# Patient Record
Sex: Female | Born: 1990 | Race: White | Hispanic: No | Marital: Single | Smoking: Former smoker
Health system: Southern US, Community
[De-identification: ages and names within clinical notes are randomized; demographics above are authoritative.]

## PROBLEM LIST (undated history)

## (undated) ENCOUNTER — Inpatient Hospital Stay: Payer: Self-pay

## (undated) DIAGNOSIS — Z8619 Personal history of other infectious and parasitic diseases: Secondary | ICD-10-CM

## (undated) DIAGNOSIS — G43109 Migraine with aura, not intractable, without status migrainosus: Secondary | ICD-10-CM

## (undated) DIAGNOSIS — Z87440 Personal history of urinary (tract) infections: Secondary | ICD-10-CM

## (undated) DIAGNOSIS — Z862 Personal history of diseases of the blood and blood-forming organs and certain disorders involving the immune mechanism: Secondary | ICD-10-CM

## (undated) DIAGNOSIS — K529 Noninfective gastroenteritis and colitis, unspecified: Secondary | ICD-10-CM

## (undated) DIAGNOSIS — Z8759 Personal history of other complications of pregnancy, childbirth and the puerperium: Secondary | ICD-10-CM

## (undated) HISTORY — DX: Personal history of diseases of the blood and blood-forming organs and certain disorders involving the immune mechanism: Z86.2

## (undated) HISTORY — DX: Migraine with aura, not intractable, without status migrainosus: G43.109

## (undated) HISTORY — PX: TYMPANOSTOMY TUBE PLACEMENT: SHX32

## (undated) HISTORY — PX: EYE SURGERY: SHX253

## (undated) HISTORY — PX: OTHER SURGICAL HISTORY: SHX169

## (undated) HISTORY — DX: Personal history of other complications of pregnancy, childbirth and the puerperium: Z87.59

## (undated) HISTORY — DX: Noninfective gastroenteritis and colitis, unspecified: K52.9

## (undated) HISTORY — DX: Personal history of urinary (tract) infections: Z87.440

---
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
## 2011-01-08 HISTORY — PX: APPENDECTOMY: SHX54

## 2011-05-30 ENCOUNTER — Encounter (HOSPITAL_COMMUNITY): Payer: Self-pay | Admitting: *Deleted

## 2011-05-30 ENCOUNTER — Emergency Department (HOSPITAL_COMMUNITY)
Admission: EM | Admit: 2011-05-30 | Discharge: 2011-05-30 | Disposition: A | Discharge status: Home / Self Care | Payer: Medicaid Other | Attending: Emergency Medicine | Admitting: Emergency Medicine

## 2011-05-30 ENCOUNTER — Emergency Department (HOSPITAL_COMMUNITY): Payer: Medicaid Other

## 2011-05-30 DIAGNOSIS — R070 Pain in throat: Secondary | ICD-10-CM | POA: Insufficient documentation

## 2011-05-30 DIAGNOSIS — K2970 Gastritis, unspecified, without bleeding: Secondary | ICD-10-CM

## 2011-05-30 DIAGNOSIS — R599 Enlarged lymph nodes, unspecified: Secondary | ICD-10-CM | POA: Insufficient documentation

## 2011-05-30 DIAGNOSIS — K5900 Constipation, unspecified: Secondary | ICD-10-CM | POA: Insufficient documentation

## 2011-05-30 DIAGNOSIS — R110 Nausea: Secondary | ICD-10-CM | POA: Insufficient documentation

## 2011-05-30 DIAGNOSIS — R10819 Abdominal tenderness, unspecified site: Secondary | ICD-10-CM | POA: Insufficient documentation

## 2011-05-30 DIAGNOSIS — R1013 Epigastric pain: Secondary | ICD-10-CM | POA: Insufficient documentation

## 2011-05-30 DIAGNOSIS — J45909 Unspecified asthma, uncomplicated: Secondary | ICD-10-CM | POA: Insufficient documentation

## 2011-05-30 LAB — COMPREHENSIVE METABOLIC PANEL
Alkaline Phosphatase: 61 U/L (ref 39–117)
BUN: 7 mg/dL (ref 6–23)
Chloride: 104 mEq/L (ref 96–112)
Creatinine, Ser: 0.75 mg/dL (ref 0.50–1.10)
GFR calc Af Amer: >90 mL/min (ref >90)
Glucose, Bld: 94 mg/dL (ref 70–99)
Potassium: 3.9 mEq/L (ref 3.5–5.1)
Total Bilirubin: 0.4 mg/dL (ref 0.3–1.2)

## 2011-05-30 LAB — URINALYSIS, ROUTINE W REFLEX MICROSCOPIC
Bilirubin Urine: NEGATIVE
Glucose, UA: NEGATIVE mg/dL
Ketones, ur: NEGATIVE mg/dL
Nitrite: NEGATIVE
Specific Gravity, Urine: 1.021 (ref 1.005–1.030)
pH: 5.5 (ref 5.0–8.0)

## 2011-05-30 LAB — DIFFERENTIAL
Basophils Relative: 0 % (ref 0–1)
Eosinophils Absolute: 0 10*3/uL (ref 0.0–0.7)
Lymphs Abs: 1.3 10*3/uL (ref 0.7–4.0)
Monocytes Absolute: 0.7 10*3/uL (ref 0.1–1.0)
Monocytes Relative: 10 % (ref 3–12)
Neutro Abs: 4.5 10*3/uL (ref 1.7–7.7)
Neutrophils Relative %: 70 % (ref 43–77)

## 2011-05-30 LAB — CBC
HCT: 40.5 % (ref 36.0–46.0)
Hemoglobin: 14.2 g/dL (ref 12.0–15.0)
MCH: 30.8 pg (ref 26.0–34.0)
MCHC: 35.1 g/dL (ref 30.0–36.0)
RBC: 4.61 MIL/uL (ref 3.87–5.11)

## 2011-05-30 LAB — POCT PREGNANCY, URINE: Preg Test, Ur: NEGATIVE

## 2011-05-30 LAB — LIPASE, BLOOD: Lipase: 19 U/L (ref 11–59)

## 2011-05-30 MED ORDER — ONDANSETRON HCL 4 MG PO TABS: 4.0000 mg | ORAL_TABLET | Freq: Four times a day (QID) | ORAL | Status: AC

## 2011-05-30 MED ORDER — SUCRALFATE 1 G PO TABS: 1.0000 g | ORAL_TABLET | Freq: Four times a day (QID) | ORAL | Status: DC

## 2011-05-30 MED ORDER — FAMOTIDINE 20 MG PO TABS: 20.0000 mg | ORAL_TABLET | Freq: Two times a day (BID) | ORAL | Status: DC

## 2011-05-30 MED ORDER — ONDANSETRON HCL 4 MG/2ML IJ SOLN
4.0000 mg | Freq: Once | INTRAMUSCULAR | Status: AC
  Administered 2011-05-30: 4 mg via INTRAVENOUS
  Filled 2011-05-30: qty 2

## 2011-05-30 MED ORDER — FAMOTIDINE IN NACL 20-0.9 MG/50ML-% IV SOLN
20.0000 mg | Freq: Once | INTRAVENOUS | Status: AC
  Administered 2011-05-30: 20 mg via INTRAVENOUS
  Filled 2011-05-30: qty 50

## 2011-05-30 MED ORDER — POLYETHYLENE GLYCOL 3350 17 G PO PACK: 17.0000 g | PACK | Freq: Every day | ORAL | Status: AC

## 2011-05-30 MED ORDER — FENTANYL CITRATE 0.05 MG/ML IJ SOLN
50.0000 ug | Freq: Once | INTRAMUSCULAR | Status: AC
  Administered 2011-05-30: 50 ug via INTRAVENOUS
  Filled 2011-05-30: qty 2

## 2011-05-30 NOTE — ED Provider Notes (Signed)
History     CSN: [id number]  Arrival date & time 05/30/11  4098   First MD Initiated Contact with Patient 05/30/11 [phone]      Chief Complaint  Patient presents with  . Abdominal Pain    (Consider location/radiation/quality/duration/timing/severity/associated sxs/prior treatment) Patient is a 21 y.o. female presenting with abdominal pain.  Abdominal Pain The primary symptoms of the illness include abdominal pain.   21 year old female presents to emergency room with complaint of one week of worsening abdominal pain. Pain occurs while she is eating. It is epigastric and diffuse at times. Patient describes pain as a burning sensation and uncomfortable. Symptoms last 30 minutes to an hour after eating. Patient has been taking ibuprofen without improvement in symptoms. Patient had vomiting on Saturday, since that time has had acid taste in her mouth. She has had no fevers. She has had persistent nausea. Patient has IUD, does not have periods. Patient is not tried tums or antiacid medications. Patient also complains of sore throat and enlarged lymph nodes. No sick contacts. Patient reports she has not had a bowel movement in 3 days which is unusual for her, normally she goes once a day.   Past Medical History  Diagnosis Date  . Asthma   . Migraine     Past Surgical History  Procedure Date  . Eye surgery     No family history on file.  History  Substance Use Topics  . Smoking status: Never Smoker   . Smokeless tobacco: Not on file  . Alcohol Use: No    OB History    Grav Para Term Preterm Abortions TAB SAB Ect Mult Living                  Review of Systems  Gastrointestinal: Positive for abdominal pain.  All other systems reviewed and are negative.    Allergies  Penicillins  Home Medications   Current Outpatient Rx  Name Route Sig Dispense Refill  . OVER THE COUNTER MEDICATION Oral Take 1 tablet by mouth daily. Hydroxycut    . ALBUTEROL 90 MCG/ACT IN AERS  Inhalation Inhale 2 puffs into the lungs every 6 (six) hours as needed. For wheezing       BP 122/69  Pulse 71  Temp(Src) 98.4 F (36.9 C) (Oral)  Resp 16  SpO2 99%  Physical Exam  Nursing note and vitals reviewed. Constitutional: She is oriented to person, place, and time. She appears well-developed and well-nourished.  HENT:  Head: Normocephalic and atraumatic.  Nose: Nose normal.  Mouth/Throat: Oropharynx is clear and moist. No oropharyngeal exudate.  Eyes: Conjunctivae and EOM are normal. Pupils are equal, round, and reactive to light.  Neck: Normal range of motion. Neck supple. No JVD present. No tracheal deviation present. No thyromegaly present.  Cardiovascular: Normal rate, regular rhythm, normal heart sounds and intact distal pulses.  Exam reveals no gallop and no friction rub.   No murmur heard. Pulmonary/Chest: Effort normal and breath sounds normal. No stridor. No respiratory distress. She has no wheezes. She has no rales. She exhibits no tenderness.  Abdominal: Soft. Bowel sounds are normal. She exhibits no distension and no mass. There is tenderness (diffuse tenderness over entire abdomen worse in epigastrium and left upper quadrant). There is no rebound and no guarding.  Musculoskeletal: Normal range of motion. She exhibits no edema and no tenderness.  Lymphadenopathy:    She has cervical adenopathy.  Neurological: She is oriented to person, place, and time. She exhibits normal muscle  tone. Coordination normal.  Skin: Skin is dry. No rash noted. No erythema. No pallor.  Psychiatric: She has a normal mood and affect. Her behavior is normal. Judgment and thought content normal.    ED Course  Procedures (including critical care time)   Labs Reviewed  CBC  DIFFERENTIAL  COMPREHENSIVE METABOLIC PANEL  LIPASE, BLOOD  POCT PREGNANCY, URINE  URINALYSIS, ROUTINE W REFLEX MICROSCOPIC   Dg Abd 1 View  05/30/2011  *RADIOLOGY REPORT*  Clinical Data: Abdominal pain,  vomiting.  ABDOMEN - 1 VIEW  Comparison: None.  Findings: Hemidiaphragms are excluded from the image.  Organ outlines normal where seen.  IUD projects over the pelvis. Nonobstructive bowel gas pattern.  Moderate stool burden.  No acute osseous finding.  IMPRESSION: Nonobstructive bowel gas pattern.  Original Report Authenticated By: Waneta Martins, M.D.     1. Abdominal pain   2. Gastritis   3. Constipation       MDM  21 year old female with abdominal pain. Suspect she may have gastritis and/or GERD given her symptoms. We'll check labs give medications for pain and nausea and check KUB given history of constipation for 3 days      5:56 AM Patient is feeling much better. Discussed findings from today's visit with her. Will start on pepcid and Carafate along with MiraLAX for constipation  Olivia Mackie, MD 05/30/11 [phone]

## 2011-05-30 NOTE — Discharge Instructions (Signed)
Take medications as prescribed. Followup with your doctor for recheck in 2-3 days. Return to emergency room for worsening condition or new concerning symptoms. Stick to a bland diet until feeling better  Abdominal Pain Abdominal pain can be caused by many things. Your caregiver decides the seriousness of your pain by an examination and possibly blood tests and X-rays. Many cases can be observed and treated at home. Most abdominal pain is not caused by a disease and will probably improve without treatment. However, in many cases, more time must pass before a clear cause of the pain can be found. Before that point, it may not be known if you need more testing, or if hospitalization or surgery is needed. HOME CARE INSTRUCTIONS   Do not take laxatives unless directed by your caregiver.   Take pain medicine only as directed by your caregiver.   Only take over-the-counter or prescription medicines for pain, discomfort, or fever as directed by your caregiver.   Try a clear liquid diet (broth, tea, or water) for as long as directed by your caregiver. Slowly move to a bland diet as tolerated.  SEEK IMMEDIATE MEDICAL CARE IF:   The pain does not go away.   You have a fever.   You keep throwing up (vomiting).   The pain is felt only in portions of the abdomen. Pain in the right side could possibly be appendicitis. In an adult, pain in the left lower portion of the abdomen could be colitis or diverticulitis.   You pass bloody or black tarry stools.  MAKE SURE YOU:   Understand these instructions.   Will watch your condition.   Will get help right away if you are not doing well or get worse.  Document Released: 10/03/2004 Document Revised: 12/13/2010 Document Reviewed: 08/12/2007 [facility] Patient Information 2012 [place], [place].  Constipation in Adults Constipation is having fewer than 2 bowel movements per week. Usually, the stools are hard. As we grow older, constipation is more common. If  you try to fix constipation with laxatives, the problem may get worse. This is because laxatives taken over a long period of time make the colon muscles weaker. A low-fiber diet, not taking in enough fluids, and taking some medicines may make these problems worse. MEDICATIONS THAT MAY CAUSE CONSTIPATION  Water pills (diuretics).   Calcium channel blockers (used to control blood pressure and for the heart).   Certain pain medicines (narcotics).   Anticholinergics.   Anti-inflammatory agents.   Antacids that contain aluminum.  DISEASES THAT CONTRIBUTE TO CONSTIPATION  Diabetes.   Parkinson's disease.   Dementia.   Stroke.   Depression.   Illnesses that cause problems with salt and water metabolism.  HOME CARE INSTRUCTIONS   Constipation is usually best cared for without medicines. Increasing dietary fiber and eating more fruits and vegetables is the best way to manage constipation.   Slowly increase fiber intake to 25 to 38 grams per day. Whole grains, fruits, vegetables, and legumes are good sources of fiber. A dietitian can further help you incorporate high-fiber foods into your diet.   Drink enough water and fluids to keep your urine clear or pale yellow.   A fiber supplement may be added to your diet if you cannot get enough fiber from foods.   Increasing your activities also helps improve regularity.   Suppositories, as suggested by your caregiver, will also help. If you are using antacids, such as aluminum or calcium containing products, it will be helpful to switch to  products containing magnesium if your caregiver says it is okay.   If you have been given a liquid injection (enema) today, this is only a temporary measure. It should not be relied on for treatment of longstanding (chronic) constipation.   Stronger measures, such as magnesium sulfate, should be avoided if possible. This may cause uncontrollable diarrhea. Using magnesium sulfate may not allow you time to  make it to the bathroom.  SEEK IMMEDIATE MEDICAL CARE IF:   There is bright red blood in the stool.   The constipation stays for more than 4 days.   There is belly (abdominal) or rectal pain.   You do not seem to be getting better.   You have any questions or concerns.  MAKE SURE YOU:   Understand these instructions.   Will watch your condition.   Will get help right away if you are not doing well or get worse.  Document Released: 09/22/2003 Document Revised: 12/13/2010 Document Reviewed: 11/27/2010 [facility] Patient Information 2012 [place], [place].  Gastritis Gastritis is an inflammation (the body's way of reacting to injury and/or infection) of the stomach. It is often caused by viral or bacterial (germ) infections. It can also be caused by chemicals (including alcohol) and medications. This illness may be associated with generalized malaise (feeling tired, not well), cramps, and fever. The illness may last 2 to 7 days. If symptoms of gastritis continue, gastroscopy (looking into the stomach with a telescope-like instrument), biopsy (taking tissue samples), and/or blood tests may be necessary to determine the cause. Antibiotics will not affect the illness unless there is a bacterial infection present. One common bacterial cause of gastritis is an organism known as H. Pylori. This can be treated with antibiotics. Other forms of gastritis are caused by too much acid in the stomach. They can be treated with medications such as H2 blockers and antacids. Home treatment is usually all that is needed. Young children will quickly become dehydrated (loss of body fluids) if vomiting and diarrhea are both present. Medications may be given to control nausea. Medications are usually not given for diarrhea unless especially bothersome. Some medications slow the removal of the virus from the gastrointestinal tract. This slows down the healing process. HOME CARE INSTRUCTIONS Home care instructions for  nausea and vomiting:  For adults: drink small amounts of fluids often. Drink at least 2 quarts a day. Take sips frequently. Do not drink large amounts of fluid at one time. This may worsen the nausea.   Only take over-the-counter or prescription medicines for pain, discomfort, or fever as directed by your caregiver.   Drink clear liquids only. Those are anything you can see through such as water, broth, or soft drinks.   Once you are keeping clear liquids down, you may start full liquids, soups, juices, and ice cream or sherbet. Slowly add bland (plain, not spicy) foods to your diet.  Home care instructions for diarrhea:  Diarrhea can be caused by bacterial infections or a virus. Your condition should improve with time, rest, fluids, and/or anti-diarrheal medication.   Until your diarrhea is under control, you should drink clear liquids often in small amounts. Clear liquids include: water, broth, jell-o water and weak tea.  Avoid:  Milk.   Fruits.   Tobacco.   Alcohol.   Extremely hot or cold fluids.   Too much intake of anything at one time.  When your diarrhea stops you may add the following foods, which help the stool to become more formed:  Rice.  Bananas.   Apples without skin.   Dry toast.  Once these foods are tolerated you may add low-fat yogurt and low-fat cottage cheese. They will help to restore the normal bacterial balance in your bowel. Wash your hands well to avoid spreading bacteria (germ) or virus. SEEK IMMEDIATE MEDICAL CARE IF:   You are unable to keep fluids down.   Vomiting or diarrhea become persistent (constant).   Abdominal pain develops, increases, or localizes. (Right sided pain can be appendicitis. Left sided pain in adults can be diverticulitis.)   You develop a fever (an oral temperature above 102 F (38.9 C)).   Diarrhea becomes excessive or contains blood or mucus.   You have excessive weakness, dizziness, fainting or extreme thirst.     You are not improving or you are getting worse.   You have any other questions or concerns.  Document Released: 12/18/2000 Document Revised: 12/13/2010 Document Reviewed: 12/24/2004 [facility] Patient Information 2012 [place], [place]. Gastritis Gastritis is an inflammation (the body's way of reacting to injury and/or infection) of the stomach. It is often caused by viral or bacterial (germ) infections. It can also be caused by chemicals (including alcohol) and medications. This illness may be associated with generalized malaise (feeling tired, not well), cramps, and fever. The illness may last 2 to 7 days. If symptoms of gastritis continue, gastroscopy (looking into the stomach with a telescope-like instrument), biopsy (taking tissue samples), and/or blood tests may be necessary to determine the cause. Antibiotics will not affect the illness unless there is a bacterial infection present. One common bacterial cause of gastritis is an organism known as H. Pylori. This can be treated with antibiotics. Other forms of gastritis are caused by too much acid in the stomach. They can be treated with medications such as H2 blockers and antacids. Home treatment is usually all that is needed. Young children will quickly become dehydrated (loss of body fluids) if vomiting and diarrhea are both present. Medications may be given to control nausea. Medications are usually not given for diarrhea unless especially bothersome. Some medications slow the removal of the virus from the gastrointestinal tract. This slows down the healing process. HOME CARE INSTRUCTIONS Home care instructions for nausea and vomiting:  For adults: drink small amounts of fluids often. Drink at least 2 quarts a day. Take sips frequently. Do not drink large amounts of fluid at one time. This may worsen the nausea.   Only take over-the-counter or prescription medicines for pain, discomfort, or fever as directed by your caregiver.   Drink clear  liquids only. Those are anything you can see through such as water, broth, or soft drinks.   Once you are keeping clear liquids down, you may start full liquids, soups, juices, and ice cream or sherbet. Slowly add bland (plain, not spicy) foods to your diet.  Home care instructions for diarrhea:  Diarrhea can be caused by bacterial infections or a virus. Your condition should improve with time, rest, fluids, and/or anti-diarrheal medication.   Until your diarrhea is under control, you should drink clear liquids often in small amounts. Clear liquids include: water, broth, jell-o water and weak tea.  Avoid:  Milk.   Fruits.   Tobacco.   Alcohol.   Extremely hot or cold fluids.   Too much intake of anything at one time.  When your diarrhea stops you may add the following foods, which help the stool to become more formed:  Rice.   Bananas.   Apples without skin.  Dry toast.  Once these foods are tolerated you may add low-fat yogurt and low-fat cottage cheese. They will help to restore the normal bacterial balance in your bowel. Wash your hands well to avoid spreading bacteria (germ) or virus. SEEK IMMEDIATE MEDICAL CARE IF:   You are unable to keep fluids down.   Vomiting or diarrhea become persistent (constant).   Abdominal pain develops, increases, or localizes. (Right sided pain can be appendicitis. Left sided pain in adults can be diverticulitis.)   You develop a fever (an oral temperature above 102 F (38.9 C)).   Diarrhea becomes excessive or contains blood or mucus.   You have excessive weakness, dizziness, fainting or extreme thirst.   You are not improving or you are getting worse.   You have any other questions or concerns.  Document Released: 12/18/2000 Document Revised: 12/13/2010 Document Reviewed: 12/24/2004 [facility] Patient Information 2012 [place], [place].

## 2011-05-30 NOTE — ED Notes (Signed)
Patient is AOx4 and comfortable with her discharge instructions. 

## 2011-05-30 NOTE — ED Notes (Signed)
Request for urine culture printed and sent to lab for urine already sent

## 2011-05-30 NOTE — ED Notes (Addendum)
C/o abd pain, onset ~ 1 week ago, worse after eating, also bilateral low back pain, L>R, (denies: nd, fever, vaginal sx), mentions vomiting Thursday and Saturday, also urinary frequency. Pinpoints pain to "L mid back/flank, abd pain worse after eating", states, "has had gallbladder checked out previously and it has been OK in the past".

## 2011-05-31 LAB — URINE CULTURE
Colony Count: 5000
Culture  Setup Time: 201305230708

## 2011-09-17 DIAGNOSIS — S93409A Sprain of unspecified ligament of unspecified ankle, initial encounter: Secondary | ICD-10-CM | POA: Insufficient documentation

## 2011-09-17 DIAGNOSIS — X500XXA Overexertion from strenuous movement or load, initial encounter: Secondary | ICD-10-CM | POA: Insufficient documentation

## 2011-09-18 ENCOUNTER — Emergency Department (HOSPITAL_COMMUNITY)
Admission: EM | Admit: 2011-09-18 | Discharge: 2011-09-18 | Disposition: A | Discharge status: Home / Self Care | Payer: Self-pay | Attending: Emergency Medicine | Admitting: Emergency Medicine

## 2011-09-18 ENCOUNTER — Emergency Department (HOSPITAL_COMMUNITY): Payer: Self-pay

## 2011-09-18 ENCOUNTER — Encounter (HOSPITAL_COMMUNITY): Payer: Self-pay | Admitting: *Deleted

## 2011-09-18 DIAGNOSIS — S93402A Sprain of unspecified ligament of left ankle, initial encounter: Secondary | ICD-10-CM

## 2011-09-18 MED ORDER — HYDROCODONE-ACETAMINOPHEN 5-325 MG PO TABS
1.0000 | ORAL_TABLET | Freq: Once | ORAL | Status: AC
  Administered 2011-09-18: 1 via ORAL
  Filled 2011-09-18: qty 1

## 2011-09-18 MED ORDER — HYDROCODONE-ACETAMINOPHEN 5-325 MG PO TABS: 1.0000 | ORAL_TABLET | ORAL | Status: AC | PRN

## 2011-09-18 MED ORDER — IBUPROFEN 800 MG PO TABS: 800.0000 mg | ORAL_TABLET | Freq: Three times a day (TID) | ORAL | Status: AC

## 2011-09-18 NOTE — ED Notes (Signed)
Prescription x2 given with discharge instructions.

## 2011-09-18 NOTE — ED Notes (Signed)
Fell Sunday, fell again tonight. Was walking and "just fell". C/o L ankle pain, also L foot pain and leg pain up to thigh. Mentions numbness in toes. Scant bruising noted to L medial heal, CMS and ROM intact. Abrasion noted to R calf.

## 2011-09-18 NOTE — ED Provider Notes (Signed)
History     CSN: [id number]  Arrival date & time 09/17/11  2348   None     Chief Complaint  Patient presents with  . Fall  . Ankle Pain  . Leg Pain    (Consider location/radiation/quality/duration/timing/severity/associated sxs/prior treatment) HPI History provided by pt.   Pt had a fall 2 days ago and sustained an injury to her left ankle.  She twisted it again today.  Currently has moderate-severe pain medial ankle and is unable to bear weight.  Aggravated by flexion.  No associated paresthesias.    Past Medical History  Diagnosis Date  . Asthma   . Migraine     Past Surgical History  Procedure Date  . Eye surgery     No family history on file.  History  Substance Use Topics  . Smoking status: Never Smoker   . Smokeless tobacco: Not on file  . Alcohol Use: No    OB History    Grav Para Term Preterm Abortions TAB SAB Ect Mult Living                  Review of Systems  All other systems reviewed and are negative.    Allergies  Penicillins  Home Medications   Current Outpatient Rx  Name Route Sig Dispense Refill  . FAMOTIDINE 20 MG PO TABS Oral Take 1 tablet (20 mg total) by mouth 2 (two) times daily. 30 tablet 0  . IBUPROFEN 200 MG PO TABS Oral Take 200-600 mg by mouth every 6 (six) hours as needed. Pain or fever    . HYDROCODONE-ACETAMINOPHEN 5-325 MG PO TABS Oral Take 1 tablet by mouth every 4 (four) hours as needed for pain. 20 tablet 0  . IBUPROFEN 800 MG PO TABS Oral Take 1 tablet (800 mg total) by mouth 3 (three) times daily. 12 tablet 0    BP 135/76  Temp 98.3 F (36.8 C) (Oral)  Resp 18  SpO2 95%  Physical Exam  Nursing note and vitals reviewed. Constitutional: She is oriented to person, place, and time. She appears well-developed and well-nourished. No distress.  HENT:  Head: Normocephalic and atraumatic.  Eyes:       Normal appearance  Neck: Normal range of motion.  Pulmonary/Chest: Effort normal.  Musculoskeletal: Normal range  of motion.       Mild edema left medial malleolus.  Tenderness over and inferior to medial malleolus.  Pain w/ passive flexion and inversion.  2+ DP pulse.  Light and sharp sensation impaired in 2nd-5th toes.    Neurological: She is alert and oriented to person, place, and time.  Psychiatric: She has a normal mood and affect. Her behavior is normal.    ED Course  Procedures (including critical care time)  Labs Reviewed - No data to display Dg Ankle Complete Left  09/18/2011  *RADIOLOGY REPORT*  Clinical Data: Ankle pain after fall.  LEFT ANKLE COMPLETE - 3+ VIEW  Comparison: None.  Findings: Medial soft tissue swelling.  Lateral soft tissue swelling.  Slight widening of the medial tibiotalar joint space may represent ligamentous injury.  Vague curvilinear calcification over the lateral malleolus may represent avulsion fracture.  No displaced fractures identified.  No focal bone lesion or bone destruction.  IMPRESSION: Soft tissue swelling.  Suggestion of ligamentous injury in the medial ankle joint and lateral avulsion fractures.   Original Report Authenticated By: Marlon Pel, M.D.    Dg Foot Complete Left  09/18/2011  *RADIOLOGY REPORT*  Clinical Data:  Pain after fall.  LEFT FOOT - COMPLETE 3+ VIEW  Comparison: None.  Findings: The left foot appears intact. No evidence of acute fracture or subluxation.  No focal bone lesions.  Bone matrix and cortex appear intact.  No abnormal radiopaque densities in the soft tissues.  IMPRESSION: No acute bony abnormalities.   Original Report Authenticated By: Marlon Pel, M.D.      1. Sprain of left ankle       MDM  Pt presents w/ left ankle injury.  Xray consistent w/ ligamentous injury medially and possible tiny bone avulsion laterally.  Results discussed w/ pt.  Ortho tech placed in cam walker.  Pt received one vicodin for pain in ED and d/c'd home w/ same + ibuprofen.  Recommended RICE and ortho f/u (particularly because of sensory  deficits on exam).        Otilio Miu, [place] 09/18/11 [phone]

## 2011-09-19 NOTE — ED Provider Notes (Signed)
Medical screening examination/treatment/procedure(s) were performed by non-physician practitioner and as supervising physician I was immediately available for consultation/collaboration.    Vonte Rossin L Takari Duncombe, MD 09/19/11 0005 

## 2011-12-22 ENCOUNTER — Encounter (HOSPITAL_COMMUNITY): Payer: Self-pay | Admitting: Family

## 2011-12-22 ENCOUNTER — Inpatient Hospital Stay (HOSPITAL_COMMUNITY)
Admission: AD | Admit: 2011-12-22 | Discharge: 2011-12-23 | Disposition: A | Discharge status: Home / Self Care | Payer: Self-pay | Source: Ambulatory Visit | Attending: Obstetrics & Gynecology | Admitting: Obstetrics & Gynecology

## 2011-12-22 DIAGNOSIS — N390 Urinary tract infection, site not specified: Secondary | ICD-10-CM

## 2011-12-22 DIAGNOSIS — N72 Inflammatory disease of cervix uteri: Secondary | ICD-10-CM

## 2011-12-22 DIAGNOSIS — R109 Unspecified abdominal pain: Secondary | ICD-10-CM | POA: Insufficient documentation

## 2011-12-22 DIAGNOSIS — R350 Frequency of micturition: Secondary | ICD-10-CM | POA: Insufficient documentation

## 2011-12-22 DIAGNOSIS — Z975 Presence of (intrauterine) contraceptive device: Secondary | ICD-10-CM

## 2011-12-22 DIAGNOSIS — Z30431 Encounter for routine checking of intrauterine contraceptive device: Secondary | ICD-10-CM | POA: Insufficient documentation

## 2011-12-22 LAB — URINALYSIS, ROUTINE W REFLEX MICROSCOPIC
Bilirubin Urine: NEGATIVE
Ketones, ur: NEGATIVE mg/dL
Protein, ur: 30 mg/dL — AB
Urobilinogen, UA: 0.2 mg/dL (ref 0.0–1.0)

## 2011-12-22 LAB — URINE MICROSCOPIC-ADD ON

## 2011-12-22 LAB — WET PREP, GENITAL: Yeast Wet Prep HPF POC: NONE SEEN

## 2011-12-22 MED ORDER — KETOROLAC TROMETHAMINE 10 MG PO TABS
10.0000 mg | ORAL_TABLET | Freq: Once | ORAL | Status: AC | PRN
  Administered 2011-12-22: 10 mg via ORAL
  Filled 2011-12-22: qty 1

## 2011-12-22 NOTE — MAU Note (Signed)
Pt reports " a lot of pressure and a lot of pain in my stomach", states frequency. Has had an IUD x 3 yrs, has been bleeding after sex x 3 months, now is spotting off/on x 1 month.

## 2011-12-22 NOTE — MAU Provider Note (Signed)
Chief Complaint: Abdominal Pain   First Provider Initiated Contact with Patient 12/22/11 2346      SUBJECTIVE HPI: Rose [name] is a 21 y.o. G1P1001who presents with: 1. Abd pressure and pain x 3 months.  2. Urinary frequency and urgency. Ha sHx recurrent UTIs. No work-up.  3. Bleeding after sex x 3 months, now is spotting x 1 month  Has had an IUD x 3 yrs and did not have any bleeding until September 2013. Had two new partners over the summer, but used condoms every time. Denies fever, chills, increased pain w/ IC, vaginal discharge, flank pain.    Past Medical History  Diagnosis Date  . Asthma   . Migraine    OB History    Grav Para Term Preterm Abortions TAB SAB Ect Mult Living   1 1 1       1      # Outc Date GA Lbr Len/2nd Wgt Sex Del Anes PTL Lv   1 TRM 2011 [redacted]w[redacted]d   M SVD None No Yes     Past Surgical History  Procedure Date  . Eye surgery    History   Social History  . Marital Status: Single    Spouse Name: N/A    Number of Children: N/A  . Years of Education: N/A   Occupational History  . Not on file.   Social History Main Topics  . Smoking status: Never Smoker   . Smokeless tobacco: Not on file  . Alcohol Use: No  . Drug Use: No  . Sexually Active: Yes IUD for contraception   Other Topics Concern  . Not on file   Social History Narrative  . No narrative on file   No current facility-administered medications on file prior to encounter.   Current Outpatient Prescriptions on File Prior to Encounter  Medication Sig Dispense Refill  . famotidine (PEPCID) 20 MG tablet Take 1 tablet (20 mg total) by mouth 2 (two) times daily.  30 tablet  0  . ibuprofen (ADVIL,MOTRIN) 200 MG tablet Take 200-600 mg by mouth every 6 (six) hours as needed. Pain or fever       Allergies  Allergen Reactions  . Penicillins Hives    ROS: Pertinent items in HPI  OBJECTIVE Blood pressure 111/59, pulse 64, temperature 98 F (36.7 C), temperature source Oral, resp.  rate 18, height 5\' 6"  (1.676 m), weight 99.791 kg (220 lb), SpO2 100.00%. GENERAL: Well-developed, well-nourished female in mild distress, emotional. HEENT: Normocephalic HEART: normal rate RESP: normal effort ABDOMEN: Soft, mild suprapubic tenderness. Normal BS x 4. No CVAT. EXTREMITIES: Nontender, no edema NEURO: Alert and oriented SPECULUM EXAM: NEFG, physiologic discharge, normal odor. Cervix very friable. Unable to to visualize posterior half of cervix of IUD strings.  BIMANUAL: cervix closed; uterus normal size, general discomfort w/ exam and intolerance of attempts to reach very posterior cervix, but no definitive CMT. String felt.   LAB RESULTS Results for orders placed during the hospital encounter of 12/22/11 (from the past 24 hour(s))  URINALYSIS, ROUTINE W REFLEX MICROSCOPIC     Status: Abnormal   Collection Time   12/22/11 10:09 PM      Component Value Range   Color, Urine YELLOW  YELLOW   APPearance CLEAR  CLEAR   Specific Gravity, Urine >1.030 (*) 1.005 - 1.030   pH 6.0  5.0 - 8.0   Glucose, UA NEGATIVE  NEGATIVE mg/dL   Hgb urine dipstick TRACE (*) NEGATIVE   Bilirubin Urine NEGATIVE  NEGATIVE   Ketones, ur NEGATIVE  NEGATIVE mg/dL   Protein, ur 30 (*) NEGATIVE mg/dL   Urobilinogen, UA 0.2  0.0 - 1.0 mg/dL   Nitrite POSITIVE (*) NEGATIVE   Leukocytes, UA MODERATE (*) NEGATIVE  URINE MICROSCOPIC-ADD ON     Status: Abnormal   Collection Time   12/22/11 10:09 PM      Component Value Range   Squamous Epithelial / LPF FEW (*) RARE   WBC, UA 21-50  <3 WBC/hpf   RBC / HPF 3-6  <3 RBC/hpf   Bacteria, UA MANY (*) RARE  POCT PREGNANCY, URINE     Status: Normal   Collection Time   12/22/11 10:19 PM      Component Value Range   Preg Test, Ur NEGATIVE  NEGATIVE  WET PREP, GENITAL     Status: Abnormal   Collection Time   12/22/11 11:12 PM      Component Value Range   Yeast Wet Prep HPF POC NONE SEEN  NONE SEEN   Trich, Wet Prep NONE SEEN  NONE SEEN   Clue Cells Wet  Prep HPF POC FEW (*) NONE SEEN   WBC, Wet Prep HPF POC MODERATE (*) NONE SEEN    IMAGING US Transvaginal Non-ob  12/23/2011  *RADIOLOGY REPORT*  Clinical Data: Pelvic pain and bleeding, IUD in place.  TRANSABDOMINAL AND TRANSVAGINAL ULTRASOUND OF PELVIS Technique:  Both transabdominal and transvaginal ultrasound examinations of the pelvis were performed. Transabdominal technique was performed for global imaging of the pelvis including uterus, ovaries, adnexal regions, and pelvic cul-de-sac.  It was necessary to proceed with endovaginal exam following the transabdominal exam to visualize the endometrium and adnexa.  Comparison:  None  Findings:  Uterus: Normal in size and appearance, measuring 8.2 x 3.6 x 5.1 cm.  Endometrium: No abnormal thickening.  Endometrium detail is obscured by an IUD which is in the appropriate position.  Right ovary:  Normal appearance/no adnexal mass, measures 3.5 x 2.9 x 2.6 cm.  Left ovary: Normal appearance/no adnexal mass, measures 4.4 x 2.9 x 2.3 cm.  Other findings: No free fluid  IMPRESSION: IUD in appropriate position.  No acute abnormality identified.   Original Report Authenticated By: Jearld Lesch, M.D.    US Pelvis Complete  12/23/2011  *RADIOLOGY REPORT*  Clinical Data: Pelvic pain and bleeding, IUD in place.  TRANSABDOMINAL AND TRANSVAGINAL ULTRASOUND OF PELVIS Technique:  Both transabdominal and transvaginal ultrasound examinations of the pelvis were performed. Transabdominal technique was performed for global imaging of the pelvis including uterus, ovaries, adnexal regions, and pelvic cul-de-sac.  It was necessary to proceed with endovaginal exam following the transabdominal exam to visualize the endometrium and adnexa.  Comparison:  None  Findings:  Uterus: Normal in size and appearance, measuring 8.2 x 3.6 x 5.1 cm.  Endometrium: No abnormal thickening.  Endometrium detail is obscured by an IUD which is in the appropriate position.  Right ovary:  Normal  appearance/no adnexal mass, measures 3.5 x 2.9 x 2.6 cm.  Left ovary: Normal appearance/no adnexal mass, measures 4.4 x 2.9 x 2.3 cm.  Other findings: No free fluid  IMPRESSION: IUD in appropriate position.  No acute abnormality identified.   Original Report Authenticated By: Jearld Lesch, M.D.     MAU COURSE Empiric Tx w/ Rocephin and azithromicin for cervicitis (pt unable to afford Doxy Rx), especially w/ IUD in place. Do not suspect PID at this time as pain seems located in bladder, normal [place], absence of fever, but  instructed pt that she may need second azithromycin dose if Sx do not resolve and culture is pos CT.   ASSESSMENT 1. Cervicitis   2. IUD (intrauterine device) in place   3. Recurrent UTI    PLAN Discharge home GC/Ct urine cultures pending. Pt does not have insurance and cannot afford to return to [facility] or PCP for F/U. Will have her F/U at [facility] to assess response to cervicitis Tx, urine TOC.     Follow-up Information    Follow up with [facility]. In 1-2 weeks.   Contact information:   [address] [name] [address] [phone]      Follow up with [facility]. (As needed if symptoms worsen)    Contact information:   [address] [id number] mc [name] [address] [phone]          Medication List     As of 12/23/2011  3:46 AM    TAKE these medications         acetaminophen 500 MG tablet   Commonly known as: TYLENOL   Take 1,000 mg by mouth as needed.      ciprofloxacin 500 MG tablet   Commonly known as: CIPRO   Take 1 tablet (500 mg total) by mouth once.      famotidine 20 MG tablet   Commonly known as: PEPCID   Take 1 tablet (20 mg total) by mouth 2 (two) times daily.      ibuprofen 200 MG tablet   Commonly known as: ADVIL,MOTRIN   Take 200-600 mg by mouth every 6 (six) hours as needed. Pain or fever      ketorolac 10 MG tablet    Commonly known as: TORADOL   Take 1 tablet (10 mg total) by mouth every 6 (six) hours as needed for pain.         [place], CNM 12/23/2011  3:23 AM

## 2011-12-22 NOTE — MAU Note (Addendum)
Patient reports having lower abdominal pain x June/July; started bleeding after intercourse since September; is now having intermittent spotting and difficulty urinating although she feels she has to all the time. Two new sexual partners in June.

## 2011-12-23 ENCOUNTER — Inpatient Hospital Stay (HOSPITAL_COMMUNITY): Payer: Self-pay

## 2011-12-23 DIAGNOSIS — N72 Inflammatory disease of cervix uteri: Secondary | ICD-10-CM

## 2011-12-23 MED ORDER — ONDANSETRON HCL 4 MG PO TABS
8.0000 mg | ORAL_TABLET | Freq: Once | ORAL | Status: AC
  Administered 2011-12-23: 8 mg via ORAL
  Filled 2011-12-23 (×2): qty 1

## 2011-12-23 MED ORDER — AZITHROMYCIN 250 MG PO TABS
1000.0000 mg | ORAL_TABLET | Freq: Once | ORAL | Status: AC
  Administered 2011-12-23: 1000 mg via ORAL

## 2011-12-23 MED ORDER — AZITHROMYCIN 250 MG PO TABS
1000.0000 mg | ORAL_TABLET | Freq: Every day | ORAL | Status: DC
  Filled 2011-12-23: qty 4

## 2011-12-23 MED ORDER — KETOROLAC TROMETHAMINE 10 MG PO TABS: 10.0000 mg | ORAL_TABLET | Freq: Four times a day (QID) | ORAL | Status: DC | PRN

## 2011-12-23 MED ORDER — CEFTRIAXONE SODIUM 250 MG IJ SOLR
250.0000 mg | Freq: Once | INTRAMUSCULAR | Status: AC
  Administered 2011-12-23: 250 mg via INTRAMUSCULAR
  Filled 2011-12-23: qty 250

## 2011-12-23 MED ORDER — CIPROFLOXACIN HCL 500 MG PO TABS: 500.0000 mg | ORAL_TABLET | Freq: Once | ORAL | Status: DC

## 2011-12-23 MED ORDER — CIPROFLOXACIN HCL 500 MG PO TABS
500.0000 mg | ORAL_TABLET | Freq: Once | ORAL | Status: AC
  Administered 2011-12-23: 500 mg via ORAL
  Filled 2011-12-23: qty 1

## 2011-12-24 LAB — URINE CULTURE: Colony Count: >=100000

## 2011-12-24 LAB — GC/CHLAMYDIA PROBE AMP: GC Probe RNA: NEGATIVE

## 2011-12-24 NOTE — MAU Provider Note (Signed)
Attestation of Attending Supervision of Advanced Practitioner (PA/CNM/NP): Evaluation and management procedures were performed by the Advanced Practitioner under my supervision and collaboration.  I have reviewed the Advanced Practitioner's note and chart, and I agree with the management and plan.  Rozelle Caudle, MD, FACOG Attending Obstetrician & Gynecologist Faculty Practice, [facility]  

## 2012-01-03 ENCOUNTER — Encounter (HOSPITAL_COMMUNITY): Payer: Self-pay

## 2012-01-03 ENCOUNTER — Inpatient Hospital Stay (HOSPITAL_COMMUNITY)
Admission: AD | Admit: 2012-01-03 | Discharge: 2012-01-03 | Disposition: A | Discharge status: Home / Self Care | Payer: Self-pay | Source: Ambulatory Visit | Attending: Obstetrics & Gynecology | Admitting: Obstetrics & Gynecology

## 2012-01-03 DIAGNOSIS — Z87440 Personal history of urinary (tract) infections: Secondary | ICD-10-CM

## 2012-01-03 DIAGNOSIS — N949 Unspecified condition associated with female genital organs and menstrual cycle: Secondary | ICD-10-CM | POA: Insufficient documentation

## 2012-01-03 DIAGNOSIS — Z8619 Personal history of other infectious and parasitic diseases: Secondary | ICD-10-CM

## 2012-01-03 DIAGNOSIS — B373 Candidiasis of vulva and vagina: Secondary | ICD-10-CM

## 2012-01-03 DIAGNOSIS — B3731 Acute candidiasis of vulva and vagina: Secondary | ICD-10-CM

## 2012-01-03 DIAGNOSIS — L293 Anogenital pruritus, unspecified: Secondary | ICD-10-CM | POA: Insufficient documentation

## 2012-01-03 DIAGNOSIS — N390 Urinary tract infection, site not specified: Secondary | ICD-10-CM | POA: Insufficient documentation

## 2012-01-03 DIAGNOSIS — R109 Unspecified abdominal pain: Secondary | ICD-10-CM | POA: Insufficient documentation

## 2012-01-03 HISTORY — DX: Personal history of other infectious and parasitic diseases: Z86.19

## 2012-01-03 LAB — WET PREP, GENITAL: Trich, Wet Prep: NONE SEEN

## 2012-01-03 LAB — URINALYSIS, ROUTINE W REFLEX MICROSCOPIC
Bilirubin Urine: NEGATIVE
Glucose, UA: NEGATIVE mg/dL
Ketones, ur: NEGATIVE mg/dL
Nitrite: NEGATIVE
Specific Gravity, Urine: 1.025 (ref 1.005–1.030)
pH: 6.5 (ref 5.0–8.0)

## 2012-01-03 LAB — URINE MICROSCOPIC-ADD ON

## 2012-01-03 MED ORDER — NYSTATIN-TRIAMCINOLONE 100000-0.1 UNIT/GM-% EX OINT: TOPICAL_OINTMENT | Freq: Three times a day (TID) | CUTANEOUS | Status: DC

## 2012-01-03 MED ORDER — FLUCONAZOLE 150 MG PO TABS
150.0000 mg | ORAL_TABLET | Freq: Once | ORAL | Status: AC
  Administered 2012-01-03: 150 mg via ORAL
  Filled 2012-01-03: qty 1

## 2012-01-03 MED ORDER — NYSTATIN-TRIAMCINOLONE 100000-0.1 UNIT/GM-% EX CREA
TOPICAL_CREAM | Freq: Once | CUTANEOUS | Status: AC
  Administered 2012-01-03: 23:00:00 via TOPICAL
  Filled 2012-01-03: qty 15

## 2012-01-03 NOTE — MAU Provider Note (Signed)
Chief Complaint: Vaginal Pain and Flank Pain   First Provider Initiated Contact with Patient 01/03/12 2204     SUBJECTIVE HPI: Rose [name] is a 21 y.o. G1P1001 who to presents to MAU with vulvar and vaginal itching, irritation, swelling and sores x 5 days.  Was seen in MAU 2wks ago. Dx with UTI and chlamydia. Tx Cipro, Azithromycin and Rocephin. Denies vaginal discharge or bleeding, dysuria, frequency or urgency. Partner was tested, negative. Also has intermittent ride-sided pain. None now.  Past Medical History  Diagnosis Date  . Asthma   . Migraine   . Hx of chlamydia infection    OB History    Grav Para Term Preterm Abortions TAB SAB Ect Mult Living   1 1 1       1      # Outc Date GA Lbr Len/2nd Wgt Sex Del Anes PTL Lv   1 TRM 2011 [redacted]w[redacted]d   M SVD None No Yes     Past Surgical History  Procedure Date  . Eye surgery    History   Social History  . Marital Status: Single    Spouse Name: N/A    Number of Children: N/A  . Years of Education: N/A   Occupational History  . Not on file.   Social History Main Topics  . Smoking status: Never Smoker   . Smokeless tobacco: Not on file  . Alcohol Use: No  . Drug Use: No  . Sexually Active: Yes    Birth Control/ Protection: IUD   Other Topics Concern  . Not on file   Social History Narrative  . No narrative on file   No current facility-administered medications on file prior to encounter.   Current Outpatient Prescriptions on File Prior to Encounter  Medication Sig Dispense Refill  . acetaminophen (TYLENOL) 500 MG tablet Take 1,000 mg by mouth as needed.            . famotidine (PEPCID) 20 MG tablet Take 1 tablet (20 mg total) by mouth 2 (two) times daily.  30 tablet  0  . ibuprofen (ADVIL,MOTRIN) 200 MG tablet Take 200-600 mg by mouth every 6 (six) hours as needed. Pain or fever      . ketorolac (TORADOL) 10 MG tablet Take 1 tablet (10 mg total) by mouth every 6 (six) hours as needed for pain.  20 tablet  0    Allergies  Allergen Reactions  . Penicillins Hives    ROS: Pertinent items in HPI  OBJECTIVE Blood pressure 128/63, pulse 75, temperature 98 F (36.7 C), temperature source Oral, resp. rate 20, height 5\' 7"  (1.702 m), weight 101.878 kg (224 lb 9.6 oz). GENERAL: Well-developed, well-nourished female in no acute distress.  HEENT: Normocephalic HEART: normal rate RESP: normal effort ABDOMEN: Soft, non-tender. No CVAT. EXTREMITIES: Nontender, no edema NEURO: Alert and oriented SPECULUM EXAM: NEFG, except for excoriation of labia majora. No lesions or blisters. Small amount of creamy, odorless, white discharge, no blood noted, cervix clean, strings ? Seen. (Proper IUD placement verified by [place] at last visit two weeks ago).  BIMANUAL: cervix closed; uterus normal size, no adnexal tenderness or masses. No CMT.   LAB RESULTS Results for orders placed during the hospital encounter of 01/03/12 (from the past 24 hour(s))  URINALYSIS, ROUTINE W REFLEX MICROSCOPIC     Status: Abnormal   Collection Time   01/03/12  9:30 PM      Component Value Range   Color, Urine YELLOW  YELLOW  APPearance CLEAR  CLEAR   Specific Gravity, Urine 1.025  1.005 - 1.030   pH 6.5  5.0 - 8.0   Glucose, UA NEGATIVE  NEGATIVE mg/dL   Hgb urine dipstick TRACE (*) NEGATIVE   Bilirubin Urine NEGATIVE  NEGATIVE   Ketones, ur NEGATIVE  NEGATIVE mg/dL   Protein, ur NEGATIVE  NEGATIVE mg/dL   Urobilinogen, UA 0.2  0.0 - 1.0 mg/dL   Nitrite NEGATIVE  NEGATIVE   Leukocytes, UA MODERATE (*) NEGATIVE  URINE MICROSCOPIC-ADD ON     Status: Abnormal   Collection Time   01/03/12  9:30 PM      Component Value Range   Squamous Epithelial / LPF FEW (*) RARE   WBC, UA 7-10  <3 WBC/hpf   RBC / HPF 3-6  <3 RBC/hpf   Bacteria, UA FEW (*) RARE  WET PREP, GENITAL     Status: Abnormal   Collection Time   01/03/12 10:25 PM      Component Value Range   Yeast Wet Prep HPF POC NONE SEEN  NONE SEEN   Trich, Wet Prep NONE SEEN   NONE SEEN   Clue Cells Wet Prep HPF POC FEW (*) NONE SEEN   WBC, Wet Prep HPF POC MODERATE (*) NONE SEEN  POCT PREGNANCY, URINE     Status: Normal   Collection Time   01/03/12 10:48 PM      Component Value Range   Preg Test, Ur NEGATIVE  NEGATIVE    IMAGING   MAU COURSE Diflucan given  ASSESSMENT 1. Clinical Dx of Vulvovaginal candidiasis   2. Hx of chlamydia infection   3. Hx: UTI (urinary tract infection)     PLAN Discharge home CT and urine TOCs pending.      Follow-up Information    Follow up with [facility]. On 01/10/2012. (at 8:15)    Contact information:   [address] [name] [address] [phone]      Follow up with [facility]. (As needed if symptoms worsen)    Contact information:   [address] [name] [address] [phone]          Medication List     As of 01/03/2012 11:27 PM    STOP taking these medications         ciprofloxacin 500 MG tablet   Commonly known as: CIPRO      ketorolac 10 MG tablet   Commonly known as: TORADOL      TAKE these medications         acetaminophen 500 MG tablet   Commonly known as: TYLENOL   Take 1,000 mg by mouth as needed.      famotidine 20 MG tablet   Commonly known as: PEPCID   Take 1 tablet (20 mg total) by mouth 2 (two) times daily.      ibuprofen 200 MG tablet   Commonly known as: ADVIL,MOTRIN   Take 200-600 mg by mouth every 6 (six) hours as needed. Pain or fever      nystatin-triamcinolone ointment   Commonly known as: MYCOLOG   Apply topically 3 (three) times daily.         [place], [place] 01/03/2012  11:27 PM

## 2012-01-03 NOTE — MAU Note (Signed)
I was seen here about 2wks ago. Dx with uti and chlamydia. Took all meds given to me for this. Now my vagina is swollen and hot and there are sores down there. No discharge. My partner was tested and everything clean. Also have R flank pain. I feel bad and don't want to move or eat

## 2012-01-05 LAB — URINE CULTURE: Colony Count: 55000

## 2012-01-05 LAB — GC/CHLAMYDIA PROBE AMP: GC Probe RNA: NEGATIVE

## 2012-01-07 NOTE — MAU Provider Note (Signed)
Attestation of Attending Supervision of Advanced Practitioner (CNM/NP): Evaluation and management procedures were performed by the Advanced Practitioner under my supervision and collaboration. I have reviewed the Advanced Practitioner's note and chart, and I agree with the management and plan.  Torri Langston H. 7:53 PM   

## 2012-01-10 ENCOUNTER — Ambulatory Visit (INDEPENDENT_AMBULATORY_CARE_PROVIDER_SITE_OTHER): Payer: BC Managed Care – PPO | Admitting: Obstetrics and Gynecology

## 2012-01-10 ENCOUNTER — Encounter: Payer: Self-pay | Admitting: Obstetrics and Gynecology

## 2012-01-10 VITALS — BP 126/73 | HR 68 | Temp 97.3°F | Ht 67.0 in | Wt 221.5 lb

## 2012-01-10 DIAGNOSIS — Z30431 Encounter for routine checking of intrauterine contraceptive device: Secondary | ICD-10-CM

## 2012-01-10 DIAGNOSIS — A749 Chlamydial infection, unspecified: Secondary | ICD-10-CM | POA: Insufficient documentation

## 2012-01-10 NOTE — Progress Notes (Signed)
  Subjective:    Patient ID: Rose [name], female    DOB: [birth year], 21 y.o.   MRN: [id number]  HPI Ms. [name] is here today for f/u of labs from her MAU visit last week.  She had been treated for UTI and GC/Chlamydia in mid-December.  Test of cure were collected at her MAU visit on 01/03/12.  Today, she denies any vaginal itching, bleeding, discharge, dysuria, abdominal pain.  She reports using Mirena for contraception. She is very happy with this.  It was placed in June 2011 and placement was confirmed in mid-December with an ultrasound due to un-visualized strings.     Review of Systems See HPI    Objective:   Physical Exam BP 126/73  Pulse 68  Temp 97.3 F (36.3 C) (Oral)  Ht 5\' 7"  (1.702 m)  Wt 221 lb 8 oz (100.472 kg)  BMI 34.69 kg/m2 Gen: alert, cooperative, NAD HEENT: AT/[place], sclera white, MMM CV: RRR, no murmurs Pulm: CTAB, no wheezes or rales Abd: +BS, soft, NTND Ext: no edema     Assessment & Plan:  Results review: Went over test of cure results with patient, all of which are negative. Contraception management: Mirena IUD in place.  Good until June 2016. Healthcare Maintenance: Pap due at age 38.  Discussed importance of finding a primary care doctor now that she has insurance.

## 2012-01-10 NOTE — Patient Instructions (Addendum)
Chlamydia, Females and Males Chlamydia is an infection that can be found in the vagina, urethra, cervix, rectum and pelvic organs in the female. In the female, it most often causes urethritis. This happens when it infects the tube (urethra) that carries the urine out of the bladder. When Chlamydia causes urethritis, there may be burning with urination. In males, it may also infect the tubes that carry the sperm from the testicle. This causes pain in the testicles and infect the prostate gland. In females, an infection of the pelvic organs is also called PID (pelvic inflammatory disease). PID may be a cause of sudden (acute) lower abdominal/belly (pelvic) pain and fever. But with Chlamydia, the infection sometimes does not cause problems that you notice (asymptomatic). It may cause an abnormal or watery mucous-like discharge from the birth canal (vagina) or penis.  CAUSES  Chlamydia is caused by germs (bacteria) that are spread during sexual contact of the:  Genitals.  Mouth.  Rectum. This infection may also be passed to a newborn baby coming through the infected birth canal. This causes eye and lung infections in the baby. Chlamydia often goes unnoticed. So it is easy to transmit it to a sexual partner without even knowing. SYMPTOMS  In females, symptoms may go unnoticed. Symptoms that are more noticeable can include:  Belly (abdominal) pain.  Painful intercourse.  Watery mucous-like discharge from the vagina.  Miscarriage.  Discomfort when urinating.  Inflammation of the rectum. In males, symptoms include:  Burning with urination.  Pain in the testicles.  Watery mucous-like discharge from the penis. It can cause longstanding (chronic) pelvic pain after frequent infections. TREATMENT   Chlamydia can be treated with medications which kill germs(antibiotics).  Inform all sexual partners about the infection. All sexual contacts need to be treated.  If you are pregnant, do not take  tetracycline type antibiotics.  PID can cause women to not be able to have children (sterile) if left untreated or if half-treated. It does this by scarring the tubes to the uterus (fallopian tubes). They carry the egg needed to form a baby. It is important to finish ALL medications given to you.  Sterility or future tubal (ectopic) pregnancies can occur in fully treated individuals. It is important to follow your prescribed treatment. That will lessen the chances of these problems.  This is a sexually transmitted infection. So you are also at risk for other sexually transmitted diseases. These include: Gonorrhea and HIV (AIDS). Testing may be done for the other sexually transmitted diseases if one disease is detected.  It is important to treat chlamydia as soon as possible. It can cause damage to other organs. HOME CARE INSTRUCTIONS  Finish all medication as prescribed. Incomplete treatment will put you at risk for not being able to have children (sterility) and tubal pregnancy. If one sexually transmitted disease is discovered, often treatment will be started to cover other possible infections.  Only take over-the-counter or prescription medicines for pain, discomfort, or fever as directed by your caregiver.  Rest.  Eat a balanced diet and drink plenty of fluids.  Warning: This infection is contagious. Do not have sex until treatment is completed. Follow up at your caregiver's office or the clinic to which you were referred. If your diagnosis (learning what is wrong) is confirmed by culture or some other method, your recent sexual contacts need treatment. Even if they are symptom free or have a negative culture or evaluation, they should be treated.  For the protection of your privacy,   test results can not be given over the phone. Make sure you receive the results of your test. Ask how these results are to be obtained if you have not been informed. It is your responsibility to obtain your test  results. PREVENTION   Women should use sanitary pads instead of tampons for vaginal discharge.  Wipe front to back after using the toilet and avoid douching.  Test for chlamydia if you are having an IUD inserted.  Practice safe sex, use condoms, have only one sex partner and be sure your sex partner is not having sex with others.  Ask your caregiver to test you for chlamydia at your regular checkups or sooner if you are having symptoms.  Ask for further information if you are pregnant. SEEK IMMEDIATE MEDICAL CARE IF:   You develop an oral temperature above 102 F (38.9 C), not controlled by medications or lasting more than 2 days.  You develop an increase in pain.  You develop any type of abnormal discharge.  You develop vaginal bleeding and it is not time for your period.  You develop painful intercourse. MAKE SURE YOU:   Understand these instructions.  Will watch your condition.  Will get help right away if you are not doing well or get worse. Document Released: 12/24/2004 Document Revised: 03/18/2011 Document Reviewed: 07/30/2007 [facility] Patient Information 2013 [place], [place]. Intrauterine Device Information An intrauterine device (IUD) is inserted into your uterus and prevents pregnancy. There are 2 types of IUDs available:  Copper IUD. This type of IUD is wrapped in copper wire and is placed inside the uterus. Copper makes the uterus and fallopian tubes produce a fluid that kills sperm. The copper IUD can stay in place for 10 years.  Hormone IUD. This type of IUD contains the hormone progestin (synthetic progesterone). The hormone thickens the cervical mucus and prevents sperm from entering the uterus, and it also thins the uterine lining to prevent implantation of a fertilized egg. The hormone can weaken or kill the sperm that get into the uterus. The hormone IUD can stay in place for 5 years. Your caregiver will make sure you are a good candidate for a contraceptive  IUD. Discuss with your caregiver the possible side effects. ADVANTAGES  It is highly effective, reversible, long-acting, and low maintenance.  There are no estrogen-related side effects.  An IUD can be used when breastfeeding.  It is not associated with weight gain.  It works immediately after insertion.  The copper IUD does not interfere with your female hormones.  The progesterone IUD can make heavy menstrual periods lighter.  The progesterone IUD can be used for 5 years.  The copper IUD can be used for 10 years. DISADVANTAGES  The progesterone IUD can be associated with irregular bleeding patterns.  The copper IUD can make your menstrual flow heavier and more painful.  You may experience cramping and vaginal bleeding after insertion. Document Released: 11/28/2003 Document Revised: 03/18/2011 Document Reviewed: 04/28/2010 [facility] Patient Information 2013 [place], [place].

## 2012-02-03 ENCOUNTER — Observation Stay (HOSPITAL_COMMUNITY): Payer: BC Managed Care – PPO | Admitting: Anesthesiology

## 2012-02-03 ENCOUNTER — Encounter (HOSPITAL_COMMUNITY)
Admission: EM | Disposition: A | Discharge status: Home / Self Care | Payer: Self-pay | Source: Home / Self Care | Attending: Emergency Medicine

## 2012-02-03 ENCOUNTER — Emergency Department (HOSPITAL_COMMUNITY): Payer: BC Managed Care – PPO

## 2012-02-03 ENCOUNTER — Encounter (HOSPITAL_COMMUNITY): Payer: Self-pay | Admitting: Anesthesiology

## 2012-02-03 ENCOUNTER — Encounter (HOSPITAL_COMMUNITY): Payer: Self-pay | Admitting: *Deleted

## 2012-02-03 ENCOUNTER — Ambulatory Visit (HOSPITAL_COMMUNITY)
Admission: EM | Admit: 2012-02-03 | Discharge: 2012-02-04 | Disposition: A | Discharge status: Home / Self Care | Payer: BC Managed Care – PPO | Attending: Surgery | Admitting: Surgery

## 2012-02-03 DIAGNOSIS — K3580 Unspecified acute appendicitis: Secondary | ICD-10-CM

## 2012-02-03 DIAGNOSIS — R1031 Right lower quadrant pain: Secondary | ICD-10-CM

## 2012-02-03 DIAGNOSIS — K37 Unspecified appendicitis: Secondary | ICD-10-CM

## 2012-02-03 HISTORY — PX: LAPAROSCOPIC APPENDECTOMY: SHX408

## 2012-02-03 LAB — URINE MICROSCOPIC-ADD ON

## 2012-02-03 LAB — CBC WITH DIFFERENTIAL/PLATELET
Basophils Relative: 0 % (ref 0–1)
Eosinophils Absolute: 0 10*3/uL (ref 0.0–0.7)
Eosinophils Relative: 0 % (ref 0–5)
HCT: 42.2 % (ref 36.0–46.0)
Hemoglobin: 14.6 g/dL (ref 12.0–15.0)
MCH: 31 pg (ref 26.0–34.0)
MCHC: 34.6 g/dL (ref 30.0–36.0)
MCV: 89.6 fL (ref 78.0–100.0)
Monocytes Absolute: 0.7 10*3/uL (ref 0.1–1.0)
Monocytes Relative: 6 % (ref 3–12)

## 2012-02-03 LAB — URINALYSIS, ROUTINE W REFLEX MICROSCOPIC
Bilirubin Urine: NEGATIVE
Glucose, UA: NEGATIVE mg/dL
Ketones, ur: NEGATIVE mg/dL
Protein, ur: NEGATIVE mg/dL
pH: 5.5 (ref 5.0–8.0)

## 2012-02-03 LAB — COMPREHENSIVE METABOLIC PANEL
Albumin: 4.1 g/dL (ref 3.5–5.2)
BUN: 6 mg/dL (ref 6–23)
Calcium: 9.4 mg/dL (ref 8.4–10.5)
Chloride: 100 mEq/L (ref 96–112)
Creatinine, Ser: 0.76 mg/dL (ref 0.50–1.10)
Total Bilirubin: 0.8 mg/dL (ref 0.3–1.2)
Total Protein: 7.1 g/dL (ref 6.0–8.3)

## 2012-02-03 LAB — POCT PREGNANCY, URINE: Preg Test, Ur: NEGATIVE

## 2012-02-03 LAB — LIPASE, BLOOD: Lipase: 12 U/L (ref 11–59)

## 2012-02-03 SURGERY — APPENDECTOMY, LAPAROSCOPIC
Anesthesia: General | Site: Abdomen | Wound class: Clean

## 2012-02-03 MED ORDER — FENTANYL CITRATE 0.05 MG/ML IJ SOLN
INTRAMUSCULAR | Status: DC | PRN
  Administered 2012-02-03: 75 ug via INTRAVENOUS
  Administered 2012-02-03: 125 ug via INTRAVENOUS

## 2012-02-03 MED ORDER — ACETAMINOPHEN 325 MG PO TABS: 650.0000 mg | ORAL_TABLET | ORAL | Status: DC | PRN

## 2012-02-03 MED ORDER — MIDAZOLAM HCL 5 MG/5ML IJ SOLN
INTRAMUSCULAR | Status: DC | PRN
  Administered 2012-02-03: 1 mg via INTRAVENOUS

## 2012-02-03 MED ORDER — IOHEXOL 300 MG/ML  SOLN
100.0000 mL | Freq: Once | INTRAMUSCULAR | Status: AC | PRN
  Administered 2012-02-03: 100 mL via INTRAVENOUS

## 2012-02-03 MED ORDER — ROCURONIUM BROMIDE 100 MG/10ML IV SOLN
INTRAVENOUS | Status: DC | PRN
  Administered 2012-02-03: 35 mg via INTRAVENOUS

## 2012-02-03 MED ORDER — IOHEXOL 300 MG/ML  SOLN
25.0000 mL | INTRAMUSCULAR | Status: AC
  Administered 2012-02-03 (×2): 25 mL via ORAL

## 2012-02-03 MED ORDER — KCL IN DEXTROSE-NACL 20-5-0.45 MEQ/L-%-% IV SOLN
INTRAVENOUS | Status: DC
  Administered 2012-02-03: 22:00:00 via INTRAVENOUS
  Filled 2012-02-03 (×3): qty 1000

## 2012-02-03 MED ORDER — DEXAMETHASONE SODIUM PHOSPHATE 4 MG/ML IJ SOLN
INTRAMUSCULAR | Status: DC | PRN
  Administered 2012-02-03: 8 mg via INTRAVENOUS

## 2012-02-03 MED ORDER — KETOROLAC TROMETHAMINE 30 MG/ML IJ SOLN
30.0000 mg | Freq: Four times a day (QID) | INTRAMUSCULAR | Status: DC
  Administered 2012-02-03 – 2012-02-04 (×3): 30 mg via INTRAVENOUS
  Filled 2012-02-03 (×7): qty 1

## 2012-02-03 MED ORDER — ONDANSETRON HCL 4 MG/2ML IJ SOLN
INTRAMUSCULAR | Status: DC | PRN
  Administered 2012-02-03: 4 mg via INTRAVENOUS

## 2012-02-03 MED ORDER — ONDANSETRON HCL 4 MG/2ML IJ SOLN
4.0000 mg | Freq: Once | INTRAMUSCULAR | Status: AC
  Administered 2012-02-03: 4 mg via INTRAVENOUS

## 2012-02-03 MED ORDER — GLYCOPYRROLATE 0.2 MG/ML IJ SOLN
INTRAMUSCULAR | Status: DC | PRN
  Administered 2012-02-03: 0.4 mg via INTRAVENOUS

## 2012-02-03 MED ORDER — HYDROMORPHONE HCL PF 1 MG/ML IJ SOLN
0.2500 mg | INTRAMUSCULAR | Status: DC | PRN
  Administered 2012-02-03 (×3): 0.5 mg via INTRAVENOUS

## 2012-02-03 MED ORDER — LIDOCAINE HCL (CARDIAC) 20 MG/ML IV SOLN
INTRAVENOUS | Status: DC | PRN
  Administered 2012-02-03: 100 mg via INTRAVENOUS

## 2012-02-03 MED ORDER — PROPOFOL 10 MG/ML IV BOLUS
INTRAVENOUS | Status: DC | PRN
  Administered 2012-02-03: 180 mg via INTRAVENOUS

## 2012-02-03 MED ORDER — ONDANSETRON HCL 4 MG PO TABS: 4.0000 mg | ORAL_TABLET | Freq: Four times a day (QID) | ORAL | Status: DC | PRN

## 2012-02-03 MED ORDER — DEXTROSE 5 % IV SOLN
INTRAVENOUS | Status: DC | PRN
  Administered 2012-02-03: 16:00:00 via INTRAVENOUS

## 2012-02-03 MED ORDER — SODIUM CHLORIDE 0.9 % IV SOLN
1.0000 g | INTRAVENOUS | Status: DC
  Administered 2012-02-03: 1 g via INTRAVENOUS
  Filled 2012-02-03 (×2): qty 1

## 2012-02-03 MED ORDER — HYDROMORPHONE HCL PF 1 MG/ML IJ SOLN
1.0000 mg | Freq: Once | INTRAMUSCULAR | Status: AC
  Administered 2012-02-03: 1 mg via INTRAVENOUS
  Filled 2012-02-03: qty 1

## 2012-02-03 MED ORDER — HYDROMORPHONE HCL PF 1 MG/ML IJ SOLN
INTRAMUSCULAR | Status: AC
  Filled 2012-02-03: qty 1

## 2012-02-03 MED ORDER — SODIUM CHLORIDE 0.9 % IR SOLN
Status: DC | PRN
  Administered 2012-02-03: 1000 mL

## 2012-02-03 MED ORDER — SUCCINYLCHOLINE CHLORIDE 20 MG/ML IJ SOLN
INTRAMUSCULAR | Status: DC | PRN
  Administered 2012-02-03: 100 mg via INTRAVENOUS

## 2012-02-03 MED ORDER — BUPIVACAINE-EPINEPHRINE 0.25% -1:200000 IJ SOLN
INTRAMUSCULAR | Status: DC | PRN
  Administered 2012-02-03: 30 mL

## 2012-02-03 MED ORDER — SODIUM CHLORIDE 0.9 % IV SOLN
Freq: Once | INTRAVENOUS | Status: AC
  Administered 2012-02-03: 1000 mL/h via INTRAVENOUS

## 2012-02-03 MED ORDER — SODIUM CHLORIDE 0.9 % IV SOLN
INTRAVENOUS | Status: DC | PRN
  Administered 2012-02-03: 16:00:00 via INTRAVENOUS

## 2012-02-03 MED ORDER — LACTATED RINGERS IV SOLN
INTRAVENOUS | Status: DC | PRN
  Administered 2012-02-03: 17:00:00 via INTRAVENOUS

## 2012-02-03 MED ORDER — NEOSTIGMINE METHYLSULFATE 1 MG/ML IJ SOLN
INTRAMUSCULAR | Status: DC | PRN
  Administered 2012-02-03: 3 mg via INTRAVENOUS

## 2012-02-03 MED ORDER — CIPROFLOXACIN IN D5W 400 MG/200ML IV SOLN
400.0000 mg | Freq: Two times a day (BID) | INTRAVENOUS | Status: DC
  Administered 2012-02-03 (×2): 400 mg via INTRAVENOUS
  Filled 2012-02-03 (×2): qty 200

## 2012-02-03 MED ORDER — ONDANSETRON HCL 4 MG/2ML IJ SOLN
INTRAMUSCULAR | Status: AC
  Filled 2012-02-03: qty 2

## 2012-02-03 MED ORDER — MORPHINE SULFATE 4 MG/ML IJ SOLN
4.0000 mg | INTRAMUSCULAR | Status: DC | PRN
  Administered 2012-02-03 – 2012-02-04 (×3): 4 mg via INTRAVENOUS
  Filled 2012-02-03 (×2): qty 1

## 2012-02-03 MED ORDER — OXYCODONE-ACETAMINOPHEN 5-325 MG PO TABS
1.0000 | ORAL_TABLET | ORAL | Status: DC | PRN
  Administered 2012-02-04: 2 via ORAL
  Filled 2012-02-03: qty 2

## 2012-02-03 MED ORDER — ONDANSETRON HCL 4 MG/2ML IJ SOLN: 4.0000 mg | Freq: Four times a day (QID) | INTRAMUSCULAR | Status: DC | PRN

## 2012-02-03 MED ORDER — MORPHINE SULFATE 4 MG/ML IJ SOLN
INTRAMUSCULAR | Status: AC
  Filled 2012-02-03: qty 1

## 2012-02-03 SURGICAL SUPPLY — 49 items
APL SKNCLS STERI-STRIP NONHPOA (GAUZE/BANDAGES/DRESSINGS) ×1
APPLIER CLIP ROT 10 11.4 M/L (STAPLE)
APR CLP MED LRG 11.4X10 (STAPLE)
BAG SPEC RTRVL LRG 6X4 10 (ENDOMECHANICALS) ×1
BENZOIN TINCTURE PRP APPL 2/3 (GAUZE/BANDAGES/DRESSINGS) ×2 IMPLANT
BLADE SURG ROTATE 9660 (MISCELLANEOUS) IMPLANT
CANISTER SUCTION 2500CC (MISCELLANEOUS) ×2 IMPLANT
CHLORAPREP W/TINT 26ML (MISCELLANEOUS) ×2 IMPLANT
CLIP APPLIE ROT 10 11.4 M/L (STAPLE) IMPLANT
CLOTH BEACON ORANGE TIMEOUT ST (SAFETY) ×2 IMPLANT
COVER SURGICAL LIGHT HANDLE (MISCELLANEOUS) ×2 IMPLANT
CUTTER FLEX LINEAR 45M (STAPLE) ×2 IMPLANT
DECANTER SPIKE VIAL GLASS SM (MISCELLANEOUS) ×2 IMPLANT
DRAPE UTILITY 15X26 W/TAPE STR (DRAPE) ×4 IMPLANT
DRSG TEGADERM 4X4.75 (GAUZE/BANDAGES/DRESSINGS) ×1 IMPLANT
ELECT REM PT RETURN 9FT ADLT (ELECTROSURGICAL) ×2
ELECTRODE REM PT RTRN 9FT ADLT (ELECTROSURGICAL) ×1 IMPLANT
ENDOLOOP SUT PDS II  0 18 (SUTURE)
ENDOLOOP SUT PDS II 0 18 (SUTURE) IMPLANT
FILTER SMOKE EVAC LAPAROSHD (FILTER) ×2 IMPLANT
GAUZE SPONGE 2X2 8PLY STRL LF (GAUZE/BANDAGES/DRESSINGS) ×1 IMPLANT
GLOVE BIO SURGEON STRL SZ 6.5 (GLOVE) ×1 IMPLANT
GLOVE BIO SURGEON STRL SZ7 (GLOVE) ×3 IMPLANT
GLOVE BIOGEL PI IND STRL 6.5 (GLOVE) IMPLANT
GLOVE BIOGEL PI IND STRL 7.5 (GLOVE) ×1 IMPLANT
GLOVE BIOGEL PI INDICATOR 6.5 (GLOVE) ×2
GLOVE BIOGEL PI INDICATOR 7.5 (GLOVE) ×2
GLOVE ECLIPSE 6.5 STRL STRAW (GLOVE) ×1 IMPLANT
GOWN STRL NON-REIN LRG LVL3 (GOWN DISPOSABLE) ×6 IMPLANT
KIT BASIN OR (CUSTOM PROCEDURE TRAY) ×2 IMPLANT
KIT ROOM TURNOVER OR (KITS) ×2 IMPLANT
NS IRRIG 1000ML POUR BTL (IV SOLUTION) ×2 IMPLANT
PAD ARMBOARD 7.5X6 YLW CONV (MISCELLANEOUS) ×4 IMPLANT
POUCH SPECIMEN RETRIEVAL 10MM (ENDOMECHANICALS) ×2 IMPLANT
RELOAD STAPLE 45 3.5 BLU ETS (ENDOMECHANICALS) ×1 IMPLANT
RELOAD STAPLE TA45 3.5 REG BLU (ENDOMECHANICALS) ×2 IMPLANT
SCALPEL HARMONIC ACE (MISCELLANEOUS) ×2 IMPLANT
SET IRRIG TUBING LAPAROSCOPIC (IRRIGATION / IRRIGATOR) ×2 IMPLANT
SPECIMEN JAR SMALL (MISCELLANEOUS) ×2 IMPLANT
SPONGE GAUZE 2X2 STER 10/PKG (GAUZE/BANDAGES/DRESSINGS) ×1
STRIP CLOSURE SKIN 1/2X4 (GAUZE/BANDAGES/DRESSINGS) ×1 IMPLANT
SUT MNCRL AB 4-0 PS2 18 (SUTURE) ×2 IMPLANT
TOWEL OR 17X24 6PK STRL BLUE (TOWEL DISPOSABLE) ×2 IMPLANT
TOWEL OR 17X26 10 PK STRL BLUE (TOWEL DISPOSABLE) ×2 IMPLANT
TRAY FOLEY CATH 14FR (SET/KITS/TRAYS/PACK) ×2 IMPLANT
TRAY LAPAROSCOPIC (CUSTOM PROCEDURE TRAY) ×2 IMPLANT
TROCAR XCEL BLADELESS 5X75MML (TROCAR) ×4 IMPLANT
TROCAR XCEL BLUNT TIP 100MML (ENDOMECHANICALS) ×2 IMPLANT
WATER STERILE IRR 1000ML POUR (IV SOLUTION) IMPLANT

## 2012-02-03 NOTE — Anesthesia Preprocedure Evaluation (Addendum)
Anesthesia Evaluation  Patient identified by MRN, date of birth, ID band Patient awake    Reviewed: Allergy & Precautions, H&P , NPO status , Patient's Chart, lab work & pertinent test results  Airway Mallampati: II TM Distance: >3 FB     Dental  (+) Teeth Intact and Dental Advisory Given   Pulmonary asthma ,  breath sounds clear to auscultation  Pulmonary exam normal       Cardiovascular Exercise Tolerance: Good Rhythm:Regular Rate:Normal     Neuro/Psych  Headaches,    GI/Hepatic negative GI ROS, Neg liver ROS, CT Abdomen w/ contrast 02-03-12 IMPRESSION: Findings consistent with acute appendicitis.  There is a small amount of fluid around the appendix but no evidence for abscess.  Nauseated   Endo/Other    Renal/GU negative Renal ROS     Musculoskeletal negative musculoskeletal ROS (+)   Abdominal (+)  Abdomen: tender.    Peds  Hematology negative hematology ROS (+)   Anesthesia Other Findings   Reproductive/Obstetrics negative OB ROS Urine pregnancy test neg.  Pt has Mirena device in place.                         Anesthesia Physical Anesthesia Plan  ASA: I  Anesthesia Plan: General   Post-op Pain Management:    Induction: Intravenous  Airway Management Planned: Oral ETT  Additional Equipment:   Intra-op Plan:   Post-operative Plan: Extubation in OR  Informed Consent:   Dental advisory given  Plan Discussed with: CRNA, Anesthesiologist and Surgeon  Anesthesia Plan Comments:        Anesthesia Quick Evaluation

## 2012-02-03 NOTE — ED Notes (Signed)
Report given at bedside in OR holding area

## 2012-02-03 NOTE — ED Provider Notes (Signed)
Medical screening examination/treatment/procedure(s) were conducted as a shared visit with non-physician practitioner(s) and myself.  I personally evaluated the patient during the encounter Pt with 1-2 day hx worsening rlq pain. Mod rlq tenderness on exam. Ct c/w appendicitis per radiology. surg called.   Suzi Roots, MD 02/03/12 [phone]

## 2012-02-03 NOTE — Transfer of Care (Signed)
Immediate Anesthesia Transfer of Care Note  Patient: Rose [name]  Procedure(s) Performed: Procedure(s) (LRB) with comments: APPENDECTOMY LAPAROSCOPIC (N/A)  Patient Location: PACU  Anesthesia Type:General  Level of Consciousness: awake, alert , oriented and patient cooperative  Airway & Oxygen Therapy: Patient Spontanous Breathing and Patient connected to nasal cannula oxygen  Post-op Assessment: Report given to PACU RN and Post -op Vital signs reviewed and stable  Post vital signs: Reviewed and stable  Complications: No apparent anesthesia complications

## 2012-02-03 NOTE — H&P (Signed)
Agree I personally examined the patient and agree with the diagnosis. Recommend emergent laparoscopic appendectomy. The surgical procedure has been discussed with the patient.  Potential risks, benefits, alternative treatments, and expected outcomes have been explained.  All of the patient's questions at this time have been answered.  The likelihood of reaching the patient's treatment goal is good.  The patient understand the proposed surgical procedure and wishes to proceed.   Wilmon Arms. Corliss Skains, MD, [facility]  02/03/2012 5:06 PM

## 2012-02-03 NOTE — Preoperative (Signed)
Beta Blockers   Reason not to administer Beta Blockers:Not Applicable 

## 2012-02-03 NOTE — H&P (Signed)
Rose [name] is an 22 y.o. female.   Chief Complaint: RLQ abdominal pain and nausea HPI: 22 y/o female who presented after 2 days of increasing RLQ abdominal pain/pressure.  Pt notes nausea and anorexia, but no vomiting.  Pt has never had this pain before.  No precipitating or alleviating measures.    CT scan shows acute appendicitis without evidence of perforation, mild leukocytosis (11.6).  Pt's pain and nausea are well controlled at this point.  Past Medical History  Diagnosis Date  . Asthma   . Migraine   . Hx of chlamydia infection     Past Surgical History  Procedure Date  . Eye surgery   . Tympanostomy tube placement   . Lymph node biopsy     Left neck after cat scratch fever    No family history on file. Social History:  reports that she has never smoked. She does not have any smokeless tobacco history on file. She reports that she does not drink alcohol or use illicit drugs.  Allergies:  Allergies  Allergen Reactions  . Penicillins Hives     (Not in a hospital admission)  Results for orders placed during the hospital encounter of 02/03/12 (from the past 48 hour(s))  CBC WITH DIFFERENTIAL     Status: Abnormal   Collection Time   02/03/12 10:23 AM      Component Value Range Comment   WBC 11.6 (*) 4.0 - 10.5 K/uL    RBC 4.71  3.87 - 5.11 MIL/uL    Hemoglobin 14.6  12.0 - 15.0 g/dL    HCT 78.2  95.6 - 21.3 %    MCV 89.6  78.0 - 100.0 fL    MCH 31.0  26.0 - 34.0 pg    MCHC 34.6  30.0 - 36.0 g/dL    RDW 08.6  57.8 - 46.9 %    Platelets 193  150 - 400 K/uL    Neutrophils Relative 84 (*) 43 - 77 %    Neutro Abs 9.7 (*) 1.7 - 7.7 K/uL    Lymphocytes Relative 11 (*) 12 - 46 %    Lymphs Abs 1.2  0.7 - 4.0 K/uL    Monocytes Relative 6  3 - 12 %    Monocytes Absolute 0.7  0.1 - 1.0 K/uL    Eosinophils Relative 0  0 - 5 %    Eosinophils Absolute 0.0  0.0 - 0.7 K/uL    Basophils Relative 0  0 - 1 %    Basophils Absolute 0.0  0.0 - 0.1 K/uL   COMPREHENSIVE  METABOLIC PANEL     Status: Normal   Collection Time   02/03/12 10:23 AM      Component Value Range Comment   Sodium 136  135 - 145 mEq/L    Potassium 3.7  3.5 - 5.1 mEq/L    Chloride 100  96 - 112 mEq/L    CO2 25  19 - 32 mEq/L    Glucose, Bld 91  70 - 99 mg/dL    BUN 6  6 - 23 mg/dL    Creatinine, Ser 6.29  0.50 - 1.10 mg/dL    Calcium 9.4  8.4 - 52.8 mg/dL    Total Protein 7.1  6.0 - 8.3 g/dL    Albumin 4.1  3.5 - 5.2 g/dL    AST 19  0 - 37 U/L    ALT 15  0 - 35 U/L    Alkaline Phosphatase 66  39 -  117 U/L    Total Bilirubin 0.8  0.3 - 1.2 mg/dL    GFR calc non Af Amer >90  >90 mL/min    GFR calc Af Amer >90  >90 mL/min   URINALYSIS, ROUTINE W REFLEX MICROSCOPIC     Status: Abnormal   Collection Time   02/03/12 10:28 AM      Component Value Range Comment   Color, Urine YELLOW  YELLOW    APPearance CLEAR  CLEAR    Specific Gravity, Urine 1.021  1.005 - 1.030    pH 5.5  5.0 - 8.0    Glucose, UA NEGATIVE  NEGATIVE mg/dL    Hgb urine dipstick NEGATIVE  NEGATIVE    Bilirubin Urine NEGATIVE  NEGATIVE    Ketones, ur NEGATIVE  NEGATIVE mg/dL    Protein, ur NEGATIVE  NEGATIVE mg/dL    Urobilinogen, UA 0.2  0.0 - 1.0 mg/dL    Nitrite NEGATIVE  NEGATIVE    Leukocytes, UA SMALL (*) NEGATIVE   URINE MICROSCOPIC-ADD ON     Status: Abnormal   Collection Time   02/03/12 10:28 AM      Component Value Range Comment   Squamous Epithelial / LPF FEW (*) RARE    WBC, UA 0-2  <3 WBC/hpf    Bacteria, UA FEW (*) RARE   POCT PREGNANCY, URINE     Status: Normal   Collection Time   02/03/12 11:02 AM      Component Value Range Comment   Preg Test, Ur NEGATIVE  NEGATIVE   LIPASE, BLOOD     Status: Normal   Collection Time   02/03/12 11:32 AM      Component Value Range Comment   Lipase 12  11 - 59 U/L    Ct Abdomen Pelvis W Contrast  02/03/2012  *RADIOLOGY REPORT*  Clinical Data: Right lower abdominal pain and nausea for 2 days. Negative pregnancy test.  CT ABDOMEN AND PELVIS WITH CONTRAST   Technique:  Multidetector CT imaging of the abdomen and pelvis was performed following the standard protocol during bolus administration of intravenous contrast.  Contrast: OMNIPAQUE IOHEXOL 300 MG/ML  SOLN  Comparison: Ultrasound of the pelvis 12/23/2011  Findings: The appendix is thickened.  There is periappendiceal stranding and an appendicolith.  Appendix measures 12 mm in thickness.  No periappendiceal abscess.  No free intraperitoneal air.  Images of the lung bases are unremarkable.  No focal abnormality identified within the liver, spleen, pancreas, adrenal glands, or kidneys.  The gallbladder is present.  The stomach and small bowel loops are normal in appearance. Colonic loops are normal in appearance. No retroperitoneal or mesenteric adenopathy. No evidence for aortic aneurysm.  The uterus is present and contains an intrauterine device, in the expected location.  No evidence for adnexal mass.  There is a small amount of free pelvic fluid.  Visualized osseous structures have a normal appearance.  IMPRESSION: Findings consistent with acute appendicitis.  There is a small amount of fluid around the appendix but no evidence for abscess.  The findings were discussed with Langston Masker on 02/03/2012 at 2:28 p.m.   Original Report Authenticated By: Norva Pavlov, M.D.     Review of Systems  Constitutional: Negative for fever and chills.  Respiratory: Negative for cough, shortness of breath and wheezing.   Cardiovascular: Negative for chest pain.  Gastrointestinal: Positive for nausea and abdominal pain (RLQ). Negative for heartburn, vomiting, diarrhea and blood in stool.  Genitourinary: Negative for dysuria.  Musculoskeletal: Negative for  myalgias.  Neurological: Negative for weakness.    Blood pressure 133/89, pulse 91, temperature 98.9 F (37.2 C), temperature source Oral, resp. rate 18, SpO2 100.00%. Physical Exam  Constitutional: She is oriented to person, place, and time. She appears  well-developed and well-nourished.  HENT:  Head: Normocephalic and atraumatic.  Eyes: Conjunctivae normal and EOM are normal. Pupils are equal, round, and reactive to light.  Cardiovascular: Normal rate, regular rhythm and normal heart sounds.  Exam reveals no gallop and no friction rub.   No murmur heard. Respiratory: Effort normal and breath sounds normal. No respiratory distress. She has no wheezes.  GI: Soft. Bowel sounds are normal. She exhibits no distension and no mass. There is no hepatosplenomegaly. There is tenderness (RLQ). There is tenderness at McBurney's point. There is no rebound and no guarding. No hernia.  Neurological: She is alert and oriented to person, place, and time.  Skin: Skin is warm and dry.  Psychiatric: She has a normal mood and affect. Her behavior is normal. Judgment and thought content normal.     Assessment/Plan Acute Appendicitis without evidence of perforation 1.  Admit to [facility], med-surg floor 2.  Will take straight up to OR for laparoscopic appendectomy 3.  NPO, IVF, ABX on call OR, pain control and antiemetics  Anxiety related to missing work 1.  Discussed will likely need 1-2 weeks off work, and she can not lift >20lbs for 2-3 weeks, then no lifting > 40lbs for 5-6 weeks  DORT, Sandy Blouch 02/03/2012, 3:09 PM

## 2012-02-03 NOTE — ED Notes (Signed)
Surgery at bedside.

## 2012-02-03 NOTE — Progress Notes (Signed)
Patient transferred from PACU to 6N20.  Oriented to room and instructed on use of call light.  Family at bedside.  Will continue to monitor.

## 2012-02-03 NOTE — ED Notes (Signed)
Pt is here with 2 day history of right lower abdominal pain and nausea.  Pt denies urinary s/s, vaginal discharge, vomiting or diarrhea.  LMP-- None since having son 3 years ago since having iud placed

## 2012-02-03 NOTE — Anesthesia Procedure Notes (Signed)
Procedure Name: Intubation Date/Time: 02/03/2012 4:17 PM Performed by: Tyrone Nine Pre-anesthesia Checklist: Patient identified, Timeout performed, Emergency Drugs available, Suction available and Patient being monitored Patient Re-evaluated:Patient Re-evaluated prior to inductionOxygen Delivery Method: Circle system utilized Preoxygenation: Pre-oxygenation with 100% oxygen Intubation Type: IV induction, Cricoid Pressure applied and Rapid sequence Laryngoscope Size: Mac and 3 Grade View: Grade I Tube size: 7.5 mm Number of attempts: 1 Airway Equipment and Method: Stylet Placement Confirmation: ETT inserted through vocal cords under direct vision,  positive ETCO2,  CO2 detector and breath sounds checked- equal and bilateral Secured at: 21 cm Tube secured with: Tape Dental Injury: Teeth and Oropharynx as per pre-operative assessment

## 2012-02-03 NOTE — Op Note (Signed)
Appendectomy, Lap, Procedure Note  Indications: The patient presented with a history of right-sided abdominal pain. A CT scan revealed findings consistent with acute appendicitis.  Pre-operative Diagnosis: Acute appendicitis without mention of peritonitis  Post-operative Diagnosis: Same  Surgeon: Krissa Utke K.   Assistants: none  Anesthesia: General endotracheal anesthesia  ASA Class: 2E  Procedure Details  The patient was seen again in the Holding Room. The risks, benefits, complications, treatment options, and expected outcomes were discussed with the patient and/or family. The possibilities of reaction to medication, perforation of viscus, bleeding, recurrent infection, finding a normal appendix, the need for additional procedures, failure to diagnose a condition, and creating a complication requiring transfusion or operation were discussed. There was concurrence with the proposed plan and informed consent was obtained. The site of surgery was properly noted. The patient was taken to Operating Room, identified as Rose [name] and the procedure verified as Appendectomy. A Time Out was held and the above information confirmed.  The patient was placed in the supine position and general anesthesia was induced.  The abdomen was prepped and draped in a sterile fashion. A one centimeter infraumbilical incision was made.  Dissection was carried down to the fascia bluntly.  The fascia was incised vertically.  We entered the peritoneal cavity bluntly.  A pursestring suture was passed around the incision with a 0 Vicryl.  The Hasson cannula was introduced into the abdomen and the tails of the suture were used to hold the Hasson in place.   The pneumoperitoneum was then established maintaining a maximum pressure of 15 mmHg.  Additional 5 mm cannulas then placed in the left lower quadrant of the abdomen and the right upper quadrant under direct visualization. A careful evaluation of the entire  abdomen was carried out. The patient was placed in Trendelenburg and left lateral decubitus position.  The scope was moved to the right upper quadrant port site.The appendix was swollen and inflamed.  We grasped this carefully with Glassman clamps.  There was no sign of perforation.  The appendix was carefully dissected. The appendix was was skeletonized with the harmonic scalpel.   The appendix was divided at its base using an endo-GIA stapler. Minimal appendiceal stump was left in place. There was no evidence of bleeding, leakage, or complication after division of the appendix. Irrigation was also performed and irrigate suctioned from the abdomen as well.  The umbilical port site was closed with the purse string suture. There was no residual palpable fascial defect.  The trocar site skin wounds were closed with 4-0 Monocryl.  Instrument, sponge, and needle counts were correct at the conclusion of the case.   Findings: The appendix was found to be inflamed. There were not signs of necrosis.  There was not perforation. There was not abscess formation.  Estimated Blood Loss:  Minimal         Drains: none         Specimens: appendix         Complications:  None; patient tolerated the procedure well.         Disposition: PACU - hemodynamically stable.         Condition: stable  Wilmon Arms. Corliss Skains, MD, [facility] Surgery  02/03/2012 5:08 PM

## 2012-02-03 NOTE — ED Provider Notes (Signed)
History     CSN: [id number]  Arrival date & time 02/03/12  1004   First MD Initiated Contact with Patient 02/03/12 1116      Chief Complaint  Patient presents with  . Right lower quad pain     (Consider location/radiation/quality/duration/timing/severity/associated sxs/prior treatment) Patient is a 22 y.o. female presenting with cramps. The history is provided by the patient. No language interpreter was used.  Abdominal Cramping The primary symptoms of the illness include abdominal pain and nausea. The primary symptoms of the illness do not include dysuria, vaginal discharge or vaginal bleeding. The current episode started 2 days ago. The onset of the illness was gradual. The problem has been gradually worsening.  Associated with: nothing. The patient states that she believes she is currently not pregnant. The patient has not had a change in bowel habit. Risk factors: hx of abdominal pain. Symptoms associated with the illness do not include chills, urgency, frequency or back pain. Significant associated medical issues do not include PUD, GERD or cardiac disease.  Pt complains of having abdominal pain for 2 years.   Pt reports current pain is different.  Pain is worse.   Pt reports 3 ED visits for abdominal pain in hte past.   Pt reports frequent diarrhea  Past Medical History  Diagnosis Date  . Asthma   . Migraine   . Hx of chlamydia infection     Past Surgical History  Procedure Date  . Eye surgery     No family history on file.  History  Substance Use Topics  . Smoking status: Never Smoker   . Smokeless tobacco: Not on file  . Alcohol Use: No    OB History    Grav Para Term Preterm Abortions TAB SAB Ect Mult Living   1 1 1       1       Review of Systems  Constitutional: Negative for chills.  Gastrointestinal: Positive for nausea and abdominal pain.  Genitourinary: Negative for dysuria, urgency, frequency, vaginal bleeding and vaginal discharge.  Musculoskeletal:  Negative for back pain.  All other systems reviewed and are negative.    Allergies  Penicillins  Home Medications   Current Outpatient Rx  Name  Route  Sig  Dispense  Refill  . ACETAMINOPHEN 500 MG PO TABS   Oral   Take 1,000 mg by mouth as needed.         Marland Kitchen FAMOTIDINE 20 MG PO TABS   Oral   Take 1 tablet (20 mg total) by mouth 2 (two) times daily.   30 tablet   0   . IBUPROFEN 200 MG PO TABS   Oral   Take 200-600 mg by mouth every 6 (six) hours as needed. Pain or fever         . NYSTATIN-TRIAMCINOLONE 100000-0.1 UNIT/GM-% EX OINT   Topical   Apply topically 3 (three) times daily.   30 g   0     BP 141/80  Pulse 80  Temp 98.9 F (37.2 C) (Oral)  Resp 22  SpO2 98%  Physical Exam  Constitutional: She is oriented to person, place, and time. She appears well-developed and well-nourished.  HENT:  Head: Normocephalic and atraumatic.  Right Ear: External ear normal.  Left Ear: External ear normal.  Nose: Nose normal.  Mouth/Throat: Oropharynx is clear and moist.  Eyes: EOM are normal. Pupils are equal, round, and reactive to light.  Neck: Normal range of motion.  Cardiovascular: Normal rate.  Pulmonary/Chest: Effort normal and breath sounds normal.  Abdominal: Soft. Bowel sounds are normal. She exhibits no distension. There is tenderness.  Musculoskeletal: Normal range of motion.  Neurological: She is alert and oriented to person, place, and time.  Skin: Skin is warm and dry.  Psychiatric: She has a normal mood and affect.    ED Course  Procedures (including critical care time)   Labs Reviewed  POCT PREGNANCY, URINE  URINALYSIS, ROUTINE W REFLEX MICROSCOPIC  CBC WITH DIFFERENTIAL  COMPREHENSIVE METABOLIC PANEL   No results found.   1. Appendicitis       MDM  IV NS zofran IV  Pt counseled on results.   [facility] surgery will see pt here for evaluation    Elson Areas, [place] 02/03/12 [address] [place], [place] 02/03/12  1313  Lonia Skinner [place], [place] 02/03/12 1439  Lonia Skinner [place], [place] 02/03/12 1447

## 2012-02-03 NOTE — Anesthesia Postprocedure Evaluation (Signed)
  Anesthesia Post-op Note  Patient: Rose [name]  Procedure(s) Performed: Procedure(s) (LRB) with comments: APPENDECTOMY LAPAROSCOPIC (N/A)  Patient Location: PACU  Anesthesia Type:General  Level of Consciousness: awake  Airway and Oxygen Therapy: Patient Spontanous Breathing  Post-op Pain: mild  Post-op Assessment: Post-op Vital signs reviewed  Post-op Vital Signs: Reviewed  Complications: No apparent anesthesia complications

## 2012-02-04 ENCOUNTER — Encounter (HOSPITAL_COMMUNITY): Payer: Self-pay | Admitting: Surgery

## 2012-02-04 MED ORDER — TRAMADOL HCL 50 MG PO TABS: 50.0000 mg | ORAL_TABLET | Freq: Four times a day (QID) | ORAL | Status: DC | PRN

## 2012-02-04 MED ORDER — OXYCODONE-ACETAMINOPHEN 5-325 MG PO TABS: 1.0000 | ORAL_TABLET | Freq: Four times a day (QID) | ORAL | Status: DC | PRN

## 2012-02-04 MED ORDER — DIPHENHYDRAMINE HCL 25 MG PO CAPS
50.0000 mg | ORAL_CAPSULE | Freq: Four times a day (QID) | ORAL | Status: DC | PRN
  Administered 2012-02-04: 50 mg via ORAL
  Filled 2012-02-04 (×2): qty 2

## 2012-02-04 NOTE — Progress Notes (Signed)
Discharge instructions/Med Rec Sheet reviewed w/ pt. Pt expressed understanding and copies given w/ prescriptions. Pt d/c'd in stable condition via w/c, accompanied by discharge volunteers 

## 2012-02-04 NOTE — Discharge Summary (Signed)
Agree with above.    Feeling much better.  Discharge today.  Wilmon Arms. Corliss Skains, MD, [facility] Surgery  02/04/2012 7:55 AM

## 2012-02-04 NOTE — Discharge Summary (Signed)
Physician Discharge Summary  Patient ID: Rose [name] MRN: [id number] DOB/AGE: 28-Aug-1990 22 y.o.  Admit date: 02/03/2012 Discharge date: 02/04/2012  Admitting Diagnosis: Acute Appendicitis  Discharge Diagnosis Patient Active Problem List   Diagnosis Date Noted  . Acute appendicitis 02/03/2012        Consultants None  Imaging: Ct Abdomen Pelvis W Contrast  02/03/2012  *RADIOLOGY REPORT*  Clinical Data: Right lower abdominal pain and nausea for 2 days. Negative pregnancy test.  CT ABDOMEN AND PELVIS WITH CONTRAST  Technique:  Multidetector CT imaging of the abdomen and pelvis was performed following the standard protocol during bolus administration of intravenous contrast.  Contrast: OMNIPAQUE IOHEXOL 300 MG/ML  SOLN  Comparison: Ultrasound of the pelvis 12/23/2011  Findings: The appendix is thickened.  There is periappendiceal stranding and an appendicolith.  Appendix measures 12 mm in thickness.  No periappendiceal abscess.  No free intraperitoneal air.  Images of the lung bases are unremarkable.  No focal abnormality identified within the liver, spleen, pancreas, adrenal glands, or kidneys.  The gallbladder is present.  The stomach and small bowel loops are normal in appearance. Colonic loops are normal in appearance. No retroperitoneal or mesenteric adenopathy. No evidence for aortic aneurysm.  The uterus is present and contains an intrauterine device, in the expected location.  No evidence for adnexal mass.  There is a small amount of free pelvic fluid.  Visualized osseous structures have a normal appearance.  IMPRESSION: Findings consistent with acute appendicitis.  There is a small amount of fluid around the appendix but no evidence for abscess.  The findings were discussed with Langston Masker on 02/03/2012 at 2:28 p.m.   Original Report Authenticated By: Norva Pavlov, M.D.     Procedures Dr. Corliss Skains (02/03/12) - Laparoscopic appendectomy  Hospital Course:  22 y/o female  who presented after 2 days of increasing RLQ abdominal pain/pressure. Pt notes nausea and anorexia, but no vomiting. Pt has never had this pain before. No precipitating or alleviating measures. Workup showed CT findings consistent with acute appendicitis and slightly elevated WBC count.  Patient was admitted and underwent procedure listed above.  Tolerated procedure well and was transferred to the floor.  Diet was advanced as tolerated.  On POD #1, the patient was voiding well, tolerating diet, ambulating well, pain well controlled, vital signs stable, incisions c/d/i and felt stable for discharge home.  Patient will follow up in our office in 3 weeks and knows to call with questions or concerns.  Physical Exam: General:  Alert, NAD, pleasant, comfortable Abd:  Soft, ND, mild tenderness, incisions C/D/I    Medication List     As of 02/04/2012  7:32 AM    STOP taking these medications         HYDROcodone-acetaminophen 5-325 MG per tablet   Commonly known as: NORCO/VICODIN      ibuprofen 200 MG tablet   Commonly known as: ADVIL,MOTRIN      TAKE these medications         acetaminophen 500 MG tablet   Commonly known as: TYLENOL   Take 1,000 mg by mouth as needed. For pain.      bismuth subsalicylate 262 MG/15ML suspension   Commonly known as: PEPTO BISMOL   Take 15 mLs by mouth every 6 (six) hours as needed. For upset stomach.      oxyCODONE-acetaminophen 5-325 MG per tablet   Commonly known as: PERCOCET/ROXICET   Take 1-2 tablets by mouth every 6 (six) hours as needed.  Follow-up Information    Follow up with [facility]. On 02/25/2012. (appt likely to be at 10:30am, please arrive at 10:00am for check in; please call to verify appt time.)    Contact information:   [address]   [place] [address] [phone]          Signed: Candiss Norse [facility] Surgery [phone]  02/04/2012, 7:32 AM

## 2012-02-05 ENCOUNTER — Telehealth (INDEPENDENT_AMBULATORY_CARE_PROVIDER_SITE_OTHER): Payer: Self-pay | Admitting: General Surgery

## 2012-02-05 NOTE — Telephone Encounter (Signed)
Pt called to report she was itching and had mood changes on Percocet, so was changed to Tramadol.  The Tramadol is not strong enough.  Will try Hydrocodone 5/325 mg, # 30, 1-2 po Q4-6H prn pain, no refill.  Called in to [facility] [phone].

## 2012-02-07 ENCOUNTER — Telehealth (INDEPENDENT_AMBULATORY_CARE_PROVIDER_SITE_OTHER): Payer: Self-pay | Admitting: General Surgery

## 2012-02-07 NOTE — Telephone Encounter (Signed)
Pt's mother calling to report daughter is so nauseated she is not eating at all and not drinking much, either.  She is not vomiting, afebrile and only complains of pain in her stomach.  (S/P lab appe)  Encouraged her to offer very cold/ iced Gatorade, gingerale or Sprite, or offer hot broth or tea.  Paged and updated Dr. Corliss Skains.  Ordered Phenergan 25 mg, # 30, 1 po Q6H prn nausea or vomiting, no refill and called to [facility]:  [phone]

## 2012-02-07 NOTE — Telephone Encounter (Signed)
Pt herself called to ask about not having a BM since surgery.  She has just started passing a little bit of gas.  Taking stool softener (3/day.)  Recommended she walk AMAP, take Miralax 1 capful in 10-12 oz water BID, drink more fluids (especially fruit juices) and can try small bottled enema if needed over weekend.  Pt states she understands.

## 2012-02-10 ENCOUNTER — Encounter (INDEPENDENT_AMBULATORY_CARE_PROVIDER_SITE_OTHER): Payer: Self-pay | Admitting: General Surgery

## 2012-02-25 ENCOUNTER — Ambulatory Visit (INDEPENDENT_AMBULATORY_CARE_PROVIDER_SITE_OTHER): Payer: BC Managed Care – PPO | Admitting: General Surgery

## 2012-02-25 ENCOUNTER — Encounter (INDEPENDENT_AMBULATORY_CARE_PROVIDER_SITE_OTHER): Payer: Self-pay

## 2012-02-25 VITALS — BP 132/74 | HR 61 | Temp 98.0°F | Resp 18 | Ht 67.0 in | Wt 224.0 lb

## 2012-02-25 DIAGNOSIS — K3580 Unspecified acute appendicitis: Secondary | ICD-10-CM

## 2012-02-25 DIAGNOSIS — Z9889 Other specified postprocedural states: Secondary | ICD-10-CM

## 2012-02-25 NOTE — Progress Notes (Signed)
Rose [name] December 20, 1990 [id number] 02/25/2012   History of Present Illness: Rose [name] is a  22 y.o. female who presents today status post lap appy by Dr. Corliss Skains.  Pathology reveals acute appendicitis.  The patient is tolerating a regular diet, having some diarrhea, has good pain control.  She  is back to most normal activities.   Physical Exam: Abd: soft, nontender, active bowel sounds, nondistended.  All incisions are well healed.  She had a small piece of monocryl sticking out of her LLQ that was trimmed.  Impression: 1.  Acute appendicitis, s/p lap appy  Plan: She  is able to return to normal activities. She  may follow up on a prn basis.

## 2012-02-25 NOTE — Patient Instructions (Signed)
Follow up as needed

## 2012-05-04 ENCOUNTER — Ambulatory Visit: Payer: BC Managed Care – PPO | Admitting: Obstetrics and Gynecology

## 2012-07-11 ENCOUNTER — Inpatient Hospital Stay (HOSPITAL_COMMUNITY)
Admission: AD | Admit: 2012-07-11 | Discharge: 2012-07-11 | Disposition: A | Discharge status: Home / Self Care | Payer: BC Managed Care – PPO | Source: Ambulatory Visit | Attending: Obstetrics & Gynecology | Admitting: Obstetrics & Gynecology

## 2012-07-11 ENCOUNTER — Other Ambulatory Visit: Payer: Self-pay | Admitting: Obstetrics & Gynecology

## 2012-07-11 DIAGNOSIS — R109 Unspecified abdominal pain: Secondary | ICD-10-CM

## 2012-07-11 DIAGNOSIS — N760 Acute vaginitis: Secondary | ICD-10-CM | POA: Insufficient documentation

## 2012-07-11 DIAGNOSIS — B9689 Other specified bacterial agents as the cause of diseases classified elsewhere: Secondary | ICD-10-CM | POA: Insufficient documentation

## 2012-07-11 DIAGNOSIS — N949 Unspecified condition associated with female genital organs and menstrual cycle: Secondary | ICD-10-CM | POA: Insufficient documentation

## 2012-07-11 DIAGNOSIS — Z30431 Encounter for routine checking of intrauterine contraceptive device: Secondary | ICD-10-CM | POA: Insufficient documentation

## 2012-07-11 DIAGNOSIS — A499 Bacterial infection, unspecified: Secondary | ICD-10-CM | POA: Insufficient documentation

## 2012-07-11 LAB — URINALYSIS, ROUTINE W REFLEX MICROSCOPIC
Glucose, UA: NEGATIVE mg/dL
Ketones, ur: NEGATIVE mg/dL
Protein, ur: NEGATIVE mg/dL
Urobilinogen, UA: 0.2 mg/dL (ref 0.0–1.0)

## 2012-07-11 LAB — CBC WITH DIFFERENTIAL/PLATELET
Basophils Absolute: 0 10*3/uL (ref 0.0–0.1)
Basophils Relative: 0 % (ref 0–1)
Eosinophils Relative: 1 % (ref 0–5)
HCT: 37.1 % (ref 36.0–46.0)
MCHC: 35.8 g/dL (ref 30.0–36.0)
MCV: 88.5 fL (ref 78.0–100.0)
Monocytes Absolute: 0.4 10*3/uL (ref 0.1–1.0)
Neutro Abs: 4.6 10*3/uL (ref 1.7–7.7)
RDW: 12.6 % (ref 11.5–15.5)

## 2012-07-11 LAB — URINE MICROSCOPIC-ADD ON

## 2012-07-11 LAB — POCT PREGNANCY, URINE: Preg Test, Ur: NEGATIVE

## 2012-07-11 MED ORDER — METRONIDAZOLE 500 MG PO TABS: 500.0000 mg | ORAL_TABLET | Freq: Two times a day (BID) | ORAL | Status: DC

## 2012-07-11 NOTE — MAU Provider Note (Signed)
History     CSN: [id number]  Arrival date and time: 07/11/12 1821   None     Chief Complaint  Patient presents with  . Pelvic Pain   HPI Rose [name] is 22 y.o. G1P1001 presenting with pelvic pain intermittent worse over the last week, that she believes is associated with her Mirena IUD.  It was inserted 3 years ago by [facility] but hasn't been back since.   Hx of Bacterial Vaginosis. Has vaginal discharge X 1 week, denies odor. Sexually active with 3 partners in the last 3 months.  Does not use condoms.  Denies fever, chills and UTI sxs.     Past Medical History  Diagnosis Date  . Asthma   . Migraine   . Hx of chlamydia infection     Past Surgical History  Procedure Laterality Date  . Eye surgery    . Tympanostomy tube placement    . Lymph node biopsy      Left neck after cat scratch fever  . Laparoscopic appendectomy  02/03/2012    Procedure: APPENDECTOMY LAPAROSCOPIC;  Surgeon: Wilmon Arms. Corliss Skains, MD;  Location: [facility] OR;  Service: General;  Laterality: N/A;    No family history on file.  History  Substance Use Topics  . Smoking status: Never Smoker   . Smokeless tobacco: Not on file  . Alcohol Use: No    Allergies:  Allergies  Allergen Reactions  . Penicillins Hives    Prescriptions prior to admission  Medication Sig Dispense Refill  . acetaminophen (TYLENOL) 500 MG tablet Take 1,000 mg by mouth as needed. For pain.      Marland Kitchen bismuth subsalicylate (PEPTO BISMOL) 262 MG/15ML suspension Take 15 mLs by mouth every 6 (six) hours as needed. For upset stomach.      . ciprofloxacin (CIPRO) 500 MG tablet TAKE ONE TABLET BY MOUTH TWICE DAILY UNTIL GONE  14 tablet  0  . traMADol (ULTRAM) 50 MG tablet Take 1-2 tablets (50-100 mg total) by mouth every 6 (six) hours as needed for pain.  40 tablet  0    Review of Systems  Constitutional: Negative for fever and chills.  Gastrointestinal: Positive for abdominal pain (lower abdominal pain). Negative for nausea and  vomiting.  Genitourinary: Negative for dysuria, urgency, frequency and hematuria.       Neg for vaginal bleeding   Physical Exam   Blood pressure 132/75, pulse 74, temperature 97.9 F (36.6 C), temperature source Oral, height 5\' 7"  (1.702 m), weight 223 lb 6.4 oz (101.334 kg).  Physical Exam  Constitutional: She is oriented to person, place, and time. She appears well-developed and well-nourished. No distress.  HENT:  Head: Normocephalic.  Neck: Normal range of motion.  Cardiovascular: Normal rate.   Respiratory: Effort normal.  GI: Soft. She exhibits no distension and no mass. There is no tenderness. There is no rebound and no guarding.  Genitourinary: There is no rash or lesion on the right labia. Tenderness: very small area of abrasion on the right labia , without ulcerations, drainage. There is no rash, tenderness or lesion on the left labia. Uterus is not enlarged and not tender. Cervix exhibits no motion tenderness, no discharge and no friability. Right adnexum displays tenderness (mild). Right adnexum displays no mass and no fullness. Left adnexum displays no mass, no tenderness and no fullness. No erythema, tenderness or bleeding around the vagina. Vaginal discharge (yellowish frothy discharge with odor) found.  IUD string noted--appropriate length  Neurological: She is alert and  oriented to person, place, and time.  Skin: Skin is warm and dry.  Psychiatric: She has a normal mood and affect. Her behavior is normal.   Results for orders placed during the hospital encounter of 07/11/12 (from the past 24 hour(s))  URINALYSIS, ROUTINE W REFLEX MICROSCOPIC     Status: Abnormal   Collection Time    07/11/12  6:30 PM      Result Value Range   Color, Urine YELLOW  YELLOW   APPearance HAZY (*) CLEAR   Specific Gravity, Urine >1.030 (*) 1.005 - 1.030   pH 6.0  5.0 - 8.0   Glucose, UA NEGATIVE  NEGATIVE mg/dL   Hgb urine dipstick NEGATIVE  NEGATIVE   Bilirubin Urine NEGATIVE  NEGATIVE    Ketones, ur NEGATIVE  NEGATIVE mg/dL   Protein, ur NEGATIVE  NEGATIVE mg/dL   Urobilinogen, UA 0.2  0.0 - 1.0 mg/dL   Nitrite NEGATIVE  NEGATIVE   Leukocytes, UA SMALL (*) NEGATIVE  URINE MICROSCOPIC-ADD ON     Status: Abnormal   Collection Time    07/11/12  6:30 PM      Result Value Range   Squamous Epithelial / LPF FEW (*) RARE   WBC, UA 7-10  <3 WBC/hpf   RBC / HPF 0-2  <3 RBC/hpf   Bacteria, UA MANY (*) RARE  POCT PREGNANCY, URINE     Status: None   Collection Time    07/11/12  6:40 PM      Result Value Range   Preg Test, Ur NEGATIVE  NEGATIVE  CBC WITH DIFFERENTIAL     Status: None   Collection Time    07/11/12  6:50 PM      Result Value Range   WBC 6.8  4.0 - 10.5 K/uL   RBC 4.19  3.87 - 5.11 MIL/uL   Hemoglobin 13.3  12.0 - 15.0 g/dL   HCT 16.1  09.6 - 04.5 %   MCV 88.5  78.0 - 100.0 fL   MCH 31.7  26.0 - 34.0 pg   MCHC 35.8  30.0 - 36.0 g/dL   RDW 40.9  81.1 - 91.4 %   Platelets 223  150 - 400 K/uL   Neutrophils Relative % 68  43 - 77 %   Neutro Abs 4.6  1.7 - 7.7 K/uL   Lymphocytes Relative 25  12 - 46 %   Lymphs Abs 1.7  0.7 - 4.0 K/uL   Monocytes Relative 6  3 - 12 %   Monocytes Absolute 0.4  0.1 - 1.0 K/uL   Eosinophils Relative 1  0 - 5 %   Eosinophils Absolute 0.1  0.0 - 0.7 K/uL   Basophils Relative 0  0 - 1 %   Basophils Absolute 0.0  0.0 - 0.1 K/uL  WET PREP, GENITAL     Status: Abnormal   Collection Time    07/11/12  6:55 PM      Result Value Range   Yeast Wet Prep HPF POC NONE SEEN  NONE SEEN   Trich, Wet Prep NONE SEEN  NONE SEEN   Clue Cells Wet Prep HPF POC FEW (*) NONE SEEN   WBC, Wet Prep HPF POC MANY (*) NONE SEEN   MAU Course  Procedures  MDM After medication was erx to [employer] the patient informed Elnita Maxwell RN that [facility] will be closed tomorrow and wants her Rx sent to [facility].  Rx sent.   Assessment and Plan  A:  Pelvic Pain  IUD X 3 years    Bacterial vaginosis      P: Rx for Flagyl to pharmacy with instructions to  avoid ETOH while taking med      Re-establish care with [facility].  Follow up prn    Stressed importance of condom use with intercourse  KEY,EVE M 07/11/2012, 7:37 PM

## 2012-07-11 NOTE — MAU Note (Signed)
Pt reports having increased pelvic pain for the past week. With a clear discharge. Pt thinks it may be her MYRENA IUD (INSERTED 3 YEARS AGO).

## 2012-07-18 ENCOUNTER — Encounter (HOSPITAL_COMMUNITY): Payer: Self-pay | Admitting: *Deleted

## 2012-07-18 ENCOUNTER — Emergency Department (HOSPITAL_COMMUNITY)
Admission: EM | Admit: 2012-07-18 | Discharge: 2012-07-18 | Disposition: A | Discharge status: Home / Self Care | Payer: BC Managed Care – PPO | Attending: Emergency Medicine | Admitting: Emergency Medicine

## 2012-07-18 DIAGNOSIS — J45909 Unspecified asthma, uncomplicated: Secondary | ICD-10-CM | POA: Insufficient documentation

## 2012-07-18 DIAGNOSIS — Z8679 Personal history of other diseases of the circulatory system: Secondary | ICD-10-CM | POA: Insufficient documentation

## 2012-07-18 DIAGNOSIS — N390 Urinary tract infection, site not specified: Secondary | ICD-10-CM | POA: Insufficient documentation

## 2012-07-18 DIAGNOSIS — Z880 Allergy status to penicillin: Secondary | ICD-10-CM | POA: Insufficient documentation

## 2012-07-18 DIAGNOSIS — Z8619 Personal history of other infectious and parasitic diseases: Secondary | ICD-10-CM | POA: Insufficient documentation

## 2012-07-18 LAB — CBC WITH DIFFERENTIAL/PLATELET
Eosinophils Relative: 1 % (ref 0–5)
HCT: 40.9 % (ref 36.0–46.0)
Lymphocytes Relative: 21 % (ref 12–46)
Lymphs Abs: 1.8 10*3/uL (ref 0.7–4.0)
MCV: 89.3 fL (ref 78.0–100.0)
Monocytes Absolute: 0.7 10*3/uL (ref 0.1–1.0)
RBC: 4.58 MIL/uL (ref 3.87–5.11)
WBC: 8.3 10*3/uL (ref 4.0–10.5)

## 2012-07-18 LAB — COMPREHENSIVE METABOLIC PANEL
ALT: 13 U/L (ref 0–35)
AST: 16 U/L (ref 0–37)
Alkaline Phosphatase: 59 U/L (ref 39–117)
CO2: 24 mEq/L (ref 19–32)
Chloride: 105 mEq/L (ref 96–112)
Creatinine, Ser: 0.8 mg/dL (ref 0.50–1.10)
GFR calc non Af Amer: >90 mL/min (ref >90)
Potassium: 3.6 mEq/L (ref 3.5–5.1)
Total Bilirubin: 0.4 mg/dL (ref 0.3–1.2)

## 2012-07-18 LAB — URINALYSIS, ROUTINE W REFLEX MICROSCOPIC
Glucose, UA: NEGATIVE mg/dL
Ketones, ur: NEGATIVE mg/dL
pH: 6 (ref 5.0–8.0)

## 2012-07-18 LAB — URINE MICROSCOPIC-ADD ON

## 2012-07-18 MED ORDER — HYDROCODONE-ACETAMINOPHEN 5-325 MG PO TABS: 1.0000 | ORAL_TABLET | Freq: Four times a day (QID) | ORAL | Status: DC | PRN

## 2012-07-18 MED ORDER — FENTANYL CITRATE 0.05 MG/ML IJ SOLN
50.0000 ug | Freq: Once | INTRAMUSCULAR | Status: AC
  Administered 2012-07-18: 50 ug via INTRAVENOUS
  Filled 2012-07-18: qty 2

## 2012-07-18 MED ORDER — SODIUM CHLORIDE 0.9 % IV SOLN
1000.0000 mL | Freq: Once | INTRAVENOUS | Status: AC
  Administered 2012-07-18: 1000 mL via INTRAVENOUS

## 2012-07-18 MED ORDER — HYDROMORPHONE HCL PF 1 MG/ML IJ SOLN: 1.0000 mg | Freq: Once | INTRAMUSCULAR | Status: DC

## 2012-07-18 MED ORDER — ONDANSETRON HCL 4 MG/2ML IJ SOLN
4.0000 mg | Freq: Once | INTRAMUSCULAR | Status: AC
  Administered 2012-07-18: 4 mg via INTRAVENOUS
  Filled 2012-07-18: qty 2

## 2012-07-18 MED ORDER — CIPROFLOXACIN HCL 500 MG PO TABS: 500.0000 mg | ORAL_TABLET | Freq: Two times a day (BID) | ORAL | Status: DC

## 2012-07-18 NOTE — ED Notes (Signed)
Pt c/o R pelvic pain, seen for same complaint, dx'd w/ BV and took abx for this. Pt states pain is getting worse since today, never completely resolved.

## 2012-07-18 NOTE — ED Notes (Signed)
Pt denies n/v/d and dysuria.

## 2012-07-18 NOTE — ED Provider Notes (Signed)
History    CSN: [id number] Arrival date & time 07/18/12  4010  First MD Initiated Contact with Patient 07/18/12 1958     Chief Complaint  Patient presents with  . Pelvic Pain   (Consider location/radiation/quality/duration/timing/severity/associated sxs/prior Treatment) Patient is a 22 y.o. female presenting with pelvic pain. The history is provided by the patient (the pt complains of lower abd pain.  the pt was seen one week ago and had a pelvic exam with neg preg.  tx for bv).  Pelvic Pain This is a recurrent problem. The current episode started more than 2 days ago. The problem occurs constantly. The problem has not changed since onset.Associated symptoms include abdominal pain. Pertinent negatives include no chest pain and no headaches. Nothing aggravates the symptoms. Nothing relieves the symptoms.   Past Medical History  Diagnosis Date  . Asthma   . Migraine   . Hx of chlamydia infection    Past Surgical History  Procedure Laterality Date  . Eye surgery    . Tympanostomy tube placement    . Lymph node biopsy      Left neck after cat scratch fever  . Laparoscopic appendectomy  02/03/2012    Procedure: APPENDECTOMY LAPAROSCOPIC;  Surgeon: Wilmon Arms. Corliss Skains, MD;  Location: [facility] OR;  Service: General;  Laterality: N/A;   History reviewed. No pertinent family history. History  Substance Use Topics  . Smoking status: Never Smoker   . Smokeless tobacco: Not on file  . Alcohol Use: No   OB History   Grav Para Term Preterm Abortions TAB SAB Ect Mult Living   1 1 1       1      Review of Systems  Constitutional: Negative for appetite change and fatigue.  HENT: Negative for congestion, sinus pressure and ear discharge.   Eyes: Negative for discharge.  Respiratory: Negative for cough.   Cardiovascular: Negative for chest pain.  Gastrointestinal: Positive for abdominal pain. Negative for diarrhea.  Genitourinary: Positive for pelvic pain. Negative for frequency and hematuria.   Musculoskeletal: Negative for back pain.  Skin: Negative for rash.  Neurological: Negative for seizures and headaches.  Psychiatric/Behavioral: Negative for hallucinations.    Allergies  Penicillins  Home Medications   Current Outpatient Rx  Name  Route  Sig  Dispense  Refill  . acetaminophen (TYLENOL) 500 MG tablet   Oral   Take 2,000 mg by mouth once as needed for pain. For pain.         Marland Kitchen ibuprofen (ADVIL,MOTRIN) 200 MG tablet   Oral   Take 1,200 mg by mouth once as needed for pain.         . ciprofloxacin (CIPRO) 500 MG tablet   Oral   Take 1 tablet (500 mg total) by mouth 2 (two) times daily. One po bid x 7 days   14 tablet   0   . HYDROcodone-acetaminophen (NORCO/VICODIN) 5-325 MG per tablet   Oral   Take 1 tablet by mouth every 6 (six) hours as needed for pain.   20 tablet   0   . metroNIDAZOLE (FLAGYL) 500 MG tablet   Oral   Take 1 tablet (500 mg total) by mouth 2 (two) times daily.   14 tablet   0    BP 137/78  Pulse 73  Temp(Src) 98.3 F (36.8 C) (Oral)  Resp 16  SpO2 100% Physical Exam  Constitutional: She is oriented to person, place, and time. She appears well-developed.  HENT:  Head: Normocephalic.  Eyes: Conjunctivae and EOM are normal. No scleral icterus.  Neck: Neck supple. No thyromegaly present.  Cardiovascular: Normal rate and regular rhythm.  Exam reveals no gallop and no friction rub.   No murmur heard. Pulmonary/Chest: No stridor. She has no wheezes. She has no rales. She exhibits no tenderness.  Abdominal: She exhibits no distension. There is tenderness. There is no rebound.  Suprapubic tenderness mild  Musculoskeletal: Normal range of motion. She exhibits no edema.  Lymphadenopathy:    She has no cervical adenopathy.  Neurological: She is oriented to person, place, and time. Coordination normal.  Skin: No rash noted. No erythema.  Psychiatric: She has a normal mood and affect. Her behavior is normal.    ED Course   Procedures (including critical care time) Labs Reviewed  URINALYSIS, ROUTINE W REFLEX MICROSCOPIC - Abnormal; Notable for the following:    APPearance TURBID (*)    Specific Gravity, Urine 1.037 (*)    Hgb urine dipstick MODERATE (*)    Protein, ur 100 (*)    Nitrite POSITIVE (*)    Leukocytes, UA LARGE (*)    All other components within normal limits  URINE MICROSCOPIC-ADD ON - Abnormal; Notable for the following:    Squamous Epithelial / LPF FEW (*)    Bacteria, UA MANY (*)    All other components within normal limits  URINE CULTURE  CBC WITH DIFFERENTIAL  COMPREHENSIVE METABOLIC PANEL   No results found. 1. UTI (lower urinary tract infection)     MDM  tx for uti and follow up with her obgyn  Benny Lennert, MD 07/18/12 2144

## 2012-07-21 LAB — URINE CULTURE: Colony Count: >=100000

## 2012-07-24 ENCOUNTER — Inpatient Hospital Stay (HOSPITAL_COMMUNITY)
Admission: AD | Admit: 2012-07-24 | Discharge: 2012-07-24 | Disposition: A | Discharge status: Home / Self Care | Payer: BC Managed Care – PPO | Source: Ambulatory Visit | Attending: Obstetrics & Gynecology | Admitting: Obstetrics & Gynecology

## 2012-07-24 ENCOUNTER — Encounter (HOSPITAL_COMMUNITY): Payer: Self-pay | Admitting: *Deleted

## 2012-07-24 DIAGNOSIS — B9689 Other specified bacterial agents as the cause of diseases classified elsewhere: Secondary | ICD-10-CM | POA: Insufficient documentation

## 2012-07-24 DIAGNOSIS — N760 Acute vaginitis: Secondary | ICD-10-CM | POA: Insufficient documentation

## 2012-07-24 DIAGNOSIS — T8389XD Other specified complication of genitourinary prosthetic devices, implants and grafts, subsequent encounter: Secondary | ICD-10-CM

## 2012-07-24 DIAGNOSIS — R1031 Right lower quadrant pain: Secondary | ICD-10-CM

## 2012-07-24 DIAGNOSIS — N390 Urinary tract infection, site not specified: Secondary | ICD-10-CM | POA: Insufficient documentation

## 2012-07-24 DIAGNOSIS — Z30432 Encounter for removal of intrauterine contraceptive device: Secondary | ICD-10-CM | POA: Insufficient documentation

## 2012-07-24 DIAGNOSIS — A499 Bacterial infection, unspecified: Secondary | ICD-10-CM | POA: Insufficient documentation

## 2012-07-24 LAB — URINALYSIS, ROUTINE W REFLEX MICROSCOPIC
Nitrite: NEGATIVE
Specific Gravity, Urine: >1.03 — ABNORMAL HIGH (ref 1.005–1.030)
Urobilinogen, UA: 0.2 mg/dL (ref 0.0–1.0)

## 2012-07-24 LAB — URINE MICROSCOPIC-ADD ON

## 2012-07-24 MED ORDER — KETOROLAC TROMETHAMINE 60 MG/2ML IM SOLN
60.0000 mg | Freq: Once | INTRAMUSCULAR | Status: AC
  Administered 2012-07-24: 60 mg via INTRAMUSCULAR
  Filled 2012-07-24: qty 2

## 2012-07-24 NOTE — MAU Note (Signed)
Mid & right lower abd pain x 3 weeks, nauseated "all the time."  Denies vomitting or diarrhea.  Neg UPT twice in past two weeks.

## 2012-07-24 NOTE — MAU Provider Note (Signed)
History     CSN: [id number]  Arrival date and time: 07/24/12 1621   First Provider Initiated Contact with Patient 07/24/12 1745      Chief Complaint  Patient presents with  . Abdominal Pain   HPI Rose [name] is 22 y.o. G1P1001 presents with right lower abdominal pain 3-4 weeks.  Has hx of intermittent abdominal pain since IUD was inserted -June 2011.   She was seen here 2 weeks--  Dx with Flagyl.  She was seen at [facility] dx with UTI, treated with Cipro and vicodin for pain on 1 week ago.  Urine culture showed >100,000 E.Coli sensitive to Cipro.  Hx of Laprascopic Appendectomy 1/14.   She has an appointment to re-establish care with [facility] for MOnday.  She does not plan to continue with IUD as birth control but wants Nexaplon.  She wants the IUD out today and plans to abstain from sex until seen by the office.   Not currently active.    Past Medical History  Diagnosis Date  . Asthma   . Migraine   . Hx of chlamydia infection     Past Surgical History  Procedure Laterality Date  . Eye surgery    . Tympanostomy tube placement    . Lymph node biopsy      Left neck after cat scratch fever  . Laparoscopic appendectomy  02/03/2012    Procedure: APPENDECTOMY LAPAROSCOPIC;  Surgeon: Wilmon Arms. Corliss Skains, MD;  Location: [facility] OR;  Service: General;  Laterality: N/A;  . Appendectomy      No family history on file.  History  Substance Use Topics  . Smoking status: Never Smoker   . Smokeless tobacco: Not on file  . Alcohol Use: No    Allergies:  Allergies  Allergen Reactions  . Penicillins Hives    Prescriptions prior to admission  Medication Sig Dispense Refill  . acetaminophen (TYLENOL) 500 MG tablet Take 1,000 mg by mouth once as needed for pain. For pain.      . ciprofloxacin (CIPRO) 500 MG tablet Take 1 tablet (500 mg total) by mouth 2 (two) times daily. One po bid x 7 days  14 tablet  0  . HYDROcodone-acetaminophen (NORCO/VICODIN) 5-325 MG per tablet Take 1  tablet by mouth every 6 (six) hours as needed for pain.  20 tablet  0  . ibuprofen (ADVIL,MOTRIN) 200 MG tablet Take 400 mg by mouth once as needed for pain.       . metroNIDAZOLE (FLAGYL) 500 MG tablet Take 1 tablet (500 mg total) by mouth 2 (two) times daily.  14 tablet  0    Review of Systems  Constitutional: Negative for fever and chills.  Gastrointestinal: Positive for abdominal pain. Negative for nausea, vomiting, diarrhea and constipation.  Genitourinary: Negative for dysuria, urgency, frequency and hematuria.       Neg for abnormal vaginal bleeding or discharge  Neurological: Negative for headaches.   Physical Exam   Blood pressure 124/66, pulse 81, temperature 98 F (36.7 C), temperature source Oral, resp. rate 18, height 5\' 7"  (1.702 m), weight 220 lb 6.4 oz (99.973 kg).  Physical Exam  Constitutional: She is oriented to person, place, and time. She appears well-developed and well-nourished. No distress.  HENT:  Head: Normocephalic.  GI: She exhibits no distension and no mass. There is tenderness (lower right sided pain-moderate). There is no rebound and no guarding.  Genitourinary: There is no rash, tenderness or lesion on the right labia. There is  no rash, tenderness or lesion on the left labia. Uterus is not enlarged and not tender. Right adnexum displays tenderness. Right adnexum displays no mass and no fullness (moderate). Left adnexum displays no mass, no tenderness and no fullness. No tenderness or bleeding around the vagina. No vaginal discharge found.  IUD strings well visualized.    Neurological: She is alert and oriented to person, place, and time.  Skin: Skin is warm and dry.  Psychiatric: She has a normal mood and affect. Her behavior is normal.    MAU Course  Procedures   IUD removal:  The strings were well visualized.  Strings grasped with ring forceps and easily removed with gentle pull.  There was no bleeding during or after the procedure.                      Patient is tearful stating the area that was painful was more painful now.  Explained the procedure went well and the removal was not difficult.  Order given for Toradol 60mg  IM. MDM Toradol 60mg  Im given.  Patient is now rating pain as 3/10--feels better.  Assessment and Plan  A:  Right lower abdominal pain     Recent Bacterial vaginosis--treatment with Flagyl complete     Recent UTI-continues with Cipro     IUD removal  P:  Keep scheduled appointment with Dr. Tenny Craw for Monday.         Patient would like Nexaplon for birth control.  Rose [name],EVE M 07/24/2012, 5:45 PM

## 2012-07-26 LAB — URINE CULTURE: Colony Count: 40000

## 2012-11-13 ENCOUNTER — Emergency Department (HOSPITAL_COMMUNITY)
Admission: EM | Admit: 2012-11-13 | Discharge: 2012-11-13 | Disposition: A | Discharge status: Home / Self Care | Payer: BC Managed Care – PPO | Attending: Emergency Medicine | Admitting: Emergency Medicine

## 2012-11-13 ENCOUNTER — Encounter (HOSPITAL_COMMUNITY): Payer: Self-pay | Admitting: Emergency Medicine

## 2012-11-13 DIAGNOSIS — Z9889 Other specified postprocedural states: Secondary | ICD-10-CM | POA: Insufficient documentation

## 2012-11-13 DIAGNOSIS — Z880 Allergy status to penicillin: Secondary | ICD-10-CM | POA: Insufficient documentation

## 2012-11-13 DIAGNOSIS — Z8619 Personal history of other infectious and parasitic diseases: Secondary | ICD-10-CM | POA: Insufficient documentation

## 2012-11-13 DIAGNOSIS — G43909 Migraine, unspecified, not intractable, without status migrainosus: Secondary | ICD-10-CM | POA: Insufficient documentation

## 2012-11-13 DIAGNOSIS — R109 Unspecified abdominal pain: Secondary | ICD-10-CM | POA: Insufficient documentation

## 2012-11-13 DIAGNOSIS — J45909 Unspecified asthma, uncomplicated: Secondary | ICD-10-CM | POA: Insufficient documentation

## 2012-11-13 DIAGNOSIS — Z3202 Encounter for pregnancy test, result negative: Secondary | ICD-10-CM | POA: Insufficient documentation

## 2012-11-13 LAB — COMPREHENSIVE METABOLIC PANEL
AST: 15 U/L (ref 0–37)
Albumin: 4.2 g/dL (ref 3.5–5.2)
Calcium: 9.2 mg/dL (ref 8.4–10.5)
Creatinine, Ser: 0.78 mg/dL (ref 0.50–1.10)
Total Protein: 7.4 g/dL (ref 6.0–8.3)

## 2012-11-13 LAB — CBC WITH DIFFERENTIAL/PLATELET
Basophils Absolute: 0 10*3/uL (ref 0.0–0.1)
Basophils Relative: 0 % (ref 0–1)
Eosinophils Relative: 1 % (ref 0–5)
HCT: 40.4 % (ref 36.0–46.0)
MCHC: 36.1 g/dL — ABNORMAL HIGH (ref 30.0–36.0)
MCV: 90.4 fL (ref 78.0–100.0)
Monocytes Absolute: 0.6 10*3/uL (ref 0.1–1.0)
Platelets: 229 10*3/uL (ref 150–400)
RDW: 12.1 % (ref 11.5–15.5)

## 2012-11-13 LAB — URINALYSIS, ROUTINE W REFLEX MICROSCOPIC
Glucose, UA: NEGATIVE mg/dL
Leukocytes, UA: NEGATIVE
Protein, ur: NEGATIVE mg/dL
Specific Gravity, Urine: 1.017 (ref 1.005–1.030)
pH: 7 (ref 5.0–8.0)

## 2012-11-13 LAB — URINE MICROSCOPIC-ADD ON

## 2012-11-13 LAB — POCT PREGNANCY, URINE: Preg Test, Ur: NEGATIVE

## 2012-11-13 MED ORDER — KETOROLAC TROMETHAMINE 60 MG/2ML IM SOLN
60.0000 mg | Freq: Once | INTRAMUSCULAR | Status: AC
  Administered 2012-11-13: 60 mg via INTRAMUSCULAR
  Filled 2012-11-13: qty 2

## 2012-11-13 MED ORDER — METOCLOPRAMIDE HCL 5 MG/ML IJ SOLN
10.0000 mg | Freq: Once | INTRAMUSCULAR | Status: AC
  Administered 2012-11-13: 10 mg via INTRAMUSCULAR
  Filled 2012-11-13: qty 2

## 2012-11-13 MED ORDER — DIPHENHYDRAMINE HCL 50 MG/ML IJ SOLN
50.0000 mg | Freq: Once | INTRAMUSCULAR | Status: AC
  Administered 2012-11-13: 50 mg via INTRAMUSCULAR
  Filled 2012-11-13: qty 1

## 2012-11-13 MED ORDER — PROMETHAZINE HCL 25 MG PO TABS: 25.0000 mg | ORAL_TABLET | Freq: Four times a day (QID) | ORAL | Status: DC | PRN

## 2012-11-13 NOTE — ED Notes (Signed)
The pt is here with her mother who is a pt also

## 2012-11-13 NOTE — ED Notes (Signed)
She is also c/o having a period  For the first time in 4 years.  Her mirana was removed July.

## 2012-11-13 NOTE — ED Provider Notes (Signed)
CSN: [id number]     Arrival date & time 11/13/12  1455 History   First MD Initiated Contact with Patient 11/13/12 1831     Chief Complaint  Patient presents with  . headache and abd pain     HPI Pt was seen at 1840. Per pt, c/o gradual onset and persistence of constant acute flair of her chronic migraine headache for the past 4 months. Endorses her daily headaches "began again" after she had her IUD removed in July. Describes the headache as per her usual chronic migraine headache pain pattern for many years.  States she "used to be on a medicine" for her migraine headaches, but stopped it when she "lost my insurance." States she has not re-established with a PMD yet. Headache has been associated with nausea. Denies headache was sudden or maximal in onset or at any time.  Denies visual changes, no focal motor weakness, no tingling/numbness in extremities, no fevers, no neck pain, no rash. The symptoms have been associated with no other complaints.     Past Medical History  Diagnosis Date  . Asthma   . Migraine   . Hx of chlamydia infection    Past Surgical History  Procedure Laterality Date  . Eye surgery    . Tympanostomy tube placement    . Lymph node biopsy      Left neck after cat scratch fever  . Laparoscopic appendectomy  02/03/2012    Procedure: APPENDECTOMY LAPAROSCOPIC;  Surgeon: Wilmon Arms. Corliss Skains, MD;  Location: [facility] OR;  Service: General;  Laterality: N/A;  . Appendectomy      History  Substance Use Topics  . Smoking status: Never Smoker   . Smokeless tobacco: Not on file  . Alcohol Use: No   OB History   Grav Para Term Preterm Abortions TAB SAB Ect Mult Living   1 1 1       1      Review of Systems ROS: Statement: All systems negative except as marked or noted in the HPI; Constitutional: Negative for fever and chills. ; ; Eyes: Negative for eye pain, redness and discharge. ; ; ENMT: Negative for ear pain, hoarseness, nasal congestion, sinus pressure and sore throat. ; ;  Cardiovascular: Negative for chest pain, palpitations, diaphoresis, dyspnea and peripheral edema. ; ; Respiratory: Negative for cough, wheezing and stridor. ; ; Gastrointestinal: +nausea. Negative for vomiting, diarrhea, abdominal pain, blood in stool, hematemesis, jaundice and rectal bleeding. . ; ; Genitourinary: Negative for dysuria, flank pain and hematuria. ; ; Musculoskeletal: Negative for back pain and neck pain. Negative for swelling and trauma.; ; Skin: Negative for pruritus, rash, abrasions, blisters, bruising and skin lesion.; ; Neuro: +headache. Negative for lightheadedness and neck stiffness. Negative for weakness, altered level of consciousness , altered mental status, extremity weakness, paresthesias, involuntary movement, seizure and syncope.     Allergies  Penicillins  Home Medications   Current Outpatient Rx  Name  Route  Sig  Dispense  Refill  . acetaminophen (TYLENOL) 500 MG tablet   Oral   Take 1,000 mg by mouth once as needed for pain. For pain.         Marland Kitchen ibuprofen (ADVIL,MOTRIN) 800 MG tablet   Oral   Take 800 mg by mouth every 8 (eight) hours as needed.         . Pseudoephedrine-Ibuprofen (ADVIL COLD & SINUS LIQUI-GELS) 30-200 MG CAPS   Oral   Take 1 capsule by mouth once.  BP 105/54  Pulse 61  Temp(Src) 98.1 F (36.7 C) (Oral)  Resp 17  Ht 5\' 6"  (1.676 m)  Wt 201 lb (91.173 kg)  BMI 32.46 kg/m2  SpO2 100% Physical Exam 1845: Physical examination:  Nursing notes reviewed; Vital signs and O2 SAT reviewed;  Constitutional: Well developed, Well nourished, Well hydrated, In no acute distress; Head:  Normocephalic, atraumatic; Eyes: EOMI, PERRL, No scleral icterus; ENMT: TM's clear bilat. +edemetous nasal turbinates bilat with clear rhinorrhea. Mouth and pharynx normal, Mucous membranes moist; Neck: Supple, Full range of motion, No lymphadenopathy; Cardiovascular: Regular rate and rhythm, No gallop; Respiratory: Breath sounds clear & equal  bilaterally, No rales, rhonchi, wheezes.  Speaking full sentences with ease, Normal respiratory effort/excursion; Chest: Nontender, Movement normal; Abdomen: Soft, Nontender, Nondistended, Normal bowel sounds; Genitourinary: No CVA tenderness; Spine:  No midline CS, TS, LS tenderness.;; Extremities: Pulses normal, No tenderness, No edema, No calf edema or asymmetry.; Neuro: AA&Ox3, Major CN grossly intact.  Speech clear. No facial droop. Climbs on and off stretcher easily by herself. Gait steady. No gross focal motor or sensory deficits in extremities.; Skin: Color normal, Warm, Dry.   ED Course  Procedures     EKG Interpretation   None       MDM  MDM Reviewed: previous chart, nursing note and vitals Reviewed previous: labs Interpretation: labs     Results for orders placed during the hospital encounter of 11/13/12  CBC WITH DIFFERENTIAL      Result Value Range   WBC 7.7  4.0 - 10.5 K/uL   RBC 4.47  3.87 - 5.11 MIL/uL   Hemoglobin 14.6  12.0 - 15.0 g/dL   HCT 41.3  24.4 - 01.0 %   MCV 90.4  78.0 - 100.0 fL   MCH 32.7  26.0 - 34.0 pg   MCHC 36.1 (*) 30.0 - 36.0 g/dL   RDW 27.2  53.6 - 64.4 %   Platelets 229  150 - 400 K/uL   Neutrophils Relative % 70  43 - 77 %   Neutro Abs 5.4  1.7 - 7.7 K/uL   Lymphocytes Relative 21  12 - 46 %   Lymphs Abs 1.6  0.7 - 4.0 K/uL   Monocytes Relative 8  3 - 12 %   Monocytes Absolute 0.6  0.1 - 1.0 K/uL   Eosinophils Relative 1  0 - 5 %   Eosinophils Absolute 0.1  0.0 - 0.7 K/uL   Basophils Relative 0  0 - 1 %   Basophils Absolute 0.0  0.0 - 0.1 K/uL  COMPREHENSIVE METABOLIC PANEL      Result Value Range   Sodium 139  135 - 145 mEq/L   Potassium 3.7  3.5 - 5.1 mEq/L   Chloride 106  96 - 112 mEq/L   CO2 23  19 - 32 mEq/L   Glucose, Bld 88  70 - 99 mg/dL   BUN 9  6 - 23 mg/dL   Creatinine, Ser 0.34  0.50 - 1.10 mg/dL   Calcium 9.2  8.4 - 74.2 mg/dL   Total Protein 7.4  6.0 - 8.3 g/dL   Albumin 4.2  3.5 - 5.2 g/dL   AST 15  0 - 37  U/L   ALT 12  0 - 35 U/L   Alkaline Phosphatase 59  39 - 117 U/L   Total Bilirubin 0.3  0.3 - 1.2 mg/dL   GFR calc non Af Amer >90  >90 mL/min   GFR calc Af  Amer >90  >90 mL/min  LIPASE, BLOOD      Result Value Range   Lipase 24  11 - 59 U/L  URINALYSIS, ROUTINE W REFLEX MICROSCOPIC      Result Value Range   Color, Urine YELLOW  YELLOW   APPearance CLEAR  CLEAR   Specific Gravity, Urine 1.017  1.005 - 1.030   pH 7.0  5.0 - 8.0   Glucose, UA NEGATIVE  NEGATIVE mg/dL   Hgb urine dipstick SMALL (*) NEGATIVE   Bilirubin Urine NEGATIVE  NEGATIVE   Ketones, ur NEGATIVE  NEGATIVE mg/dL   Protein, ur NEGATIVE  NEGATIVE mg/dL   Urobilinogen, UA 0.2  0.0 - 1.0 mg/dL   Nitrite NEGATIVE  NEGATIVE   Leukocytes, UA NEGATIVE  NEGATIVE  URINE MICROSCOPIC-ADD ON      Result Value Range   Squamous Epithelial / LPF RARE  RARE   WBC, UA 0-2  <3 WBC/hpf   RBC / HPF 0-2  <3 RBC/hpf   Bacteria, UA RARE  RARE  POCT PREGNANCY, URINE      Result Value Range   Preg Test, Ur NEGATIVE  NEGATIVE     1954:  Pt wants to go home now. Endorses long hx migraine headaches, today's headache per pt's usual chronic headache pain pattern. No red flags on HPI or PE. Neuro exam remains intact and unchanged. Strongly encouraged to f/u with PMD or headache specialist for good continuity of care and control of her chronic headaches. Verb understanding. Dx and testing d/w pt and family.  Questions answered.  Verb understanding, agreeable to d/c home with outpt f/u.       Laray Anger, DO 11/15/12 1642

## 2012-11-13 NOTE — ED Notes (Signed)
The pt has had a headache since this am.  She has had vomiting and abd discomfort and she thinks she vomited blood.  lmp now

## 2012-11-30 ENCOUNTER — Emergency Department (HOSPITAL_COMMUNITY)
Admission: EM | Admit: 2012-11-30 | Discharge: 2012-11-30 | Disposition: A | Discharge status: Home / Self Care | Payer: BC Managed Care – PPO | Attending: Emergency Medicine | Admitting: Emergency Medicine

## 2012-11-30 ENCOUNTER — Encounter (HOSPITAL_COMMUNITY): Payer: Self-pay | Admitting: Emergency Medicine

## 2012-11-30 ENCOUNTER — Emergency Department (HOSPITAL_COMMUNITY): Payer: BC Managed Care – PPO

## 2012-11-30 DIAGNOSIS — Y9389 Activity, other specified: Secondary | ICD-10-CM | POA: Insufficient documentation

## 2012-11-30 DIAGNOSIS — Z880 Allergy status to penicillin: Secondary | ICD-10-CM | POA: Insufficient documentation

## 2012-11-30 DIAGNOSIS — Z8619 Personal history of other infectious and parasitic diseases: Secondary | ICD-10-CM | POA: Insufficient documentation

## 2012-11-30 DIAGNOSIS — S0993XA Unspecified injury of face, initial encounter: Secondary | ICD-10-CM | POA: Insufficient documentation

## 2012-11-30 DIAGNOSIS — IMO0002 Reserved for concepts with insufficient information to code with codable children: Secondary | ICD-10-CM | POA: Insufficient documentation

## 2012-11-30 DIAGNOSIS — M545 Low back pain: Secondary | ICD-10-CM

## 2012-11-30 DIAGNOSIS — Y92410 Unspecified street and highway as the place of occurrence of the external cause: Secondary | ICD-10-CM | POA: Insufficient documentation

## 2012-11-30 DIAGNOSIS — J45909 Unspecified asthma, uncomplicated: Secondary | ICD-10-CM | POA: Insufficient documentation

## 2012-11-30 MED ORDER — CYCLOBENZAPRINE HCL 10 MG PO TABS
10.0000 mg | ORAL_TABLET | Freq: Once | ORAL | Status: AC
  Administered 2012-11-30: 10 mg via ORAL
  Filled 2012-11-30: qty 1

## 2012-11-30 MED ORDER — NAPROXEN 500 MG PO TABS: 500.0000 mg | ORAL_TABLET | Freq: Once | ORAL | Status: DC

## 2012-11-30 MED ORDER — CYCLOBENZAPRINE HCL 10 MG PO TABS: 10.0000 mg | ORAL_TABLET | Freq: Once | ORAL | Status: DC

## 2012-11-30 MED ORDER — NAPROXEN 500 MG PO TABS
500.0000 mg | ORAL_TABLET | Freq: Once | ORAL | Status: AC
  Administered 2012-11-30: 500 mg via ORAL
  Filled 2012-11-30: qty 1
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
## 2012-12-02 NOTE — ED Provider Notes (Signed)
Medical screening examination/treatment/procedure(s) were performed by non-physician practitioner and as supervising physician I was immediately available for consultation/collaboration.  Kehlani Vancamp T Dillion Stowers, MD 12/02/12 0944 

## 2013-01-23 ENCOUNTER — Encounter (HOSPITAL_COMMUNITY): Payer: Self-pay | Admitting: Emergency Medicine

## 2013-01-23 ENCOUNTER — Emergency Department (HOSPITAL_COMMUNITY)
Admission: EM | Admit: 2013-01-23 | Discharge: 2013-01-23 | Disposition: A | Discharge status: Home / Self Care | Payer: BC Managed Care – PPO | Attending: Emergency Medicine | Admitting: Emergency Medicine

## 2013-01-23 ENCOUNTER — Emergency Department (HOSPITAL_COMMUNITY): Payer: BC Managed Care – PPO

## 2013-01-23 DIAGNOSIS — Z880 Allergy status to penicillin: Secondary | ICD-10-CM | POA: Insufficient documentation

## 2013-01-23 DIAGNOSIS — S20229A Contusion of unspecified back wall of thorax, initial encounter: Secondary | ICD-10-CM

## 2013-01-23 DIAGNOSIS — Z8679 Personal history of other diseases of the circulatory system: Secondary | ICD-10-CM | POA: Insufficient documentation

## 2013-01-23 DIAGNOSIS — J45909 Unspecified asthma, uncomplicated: Secondary | ICD-10-CM | POA: Insufficient documentation

## 2013-01-23 DIAGNOSIS — F17200 Nicotine dependence, unspecified, uncomplicated: Secondary | ICD-10-CM | POA: Insufficient documentation

## 2013-01-23 DIAGNOSIS — Z791 Long term (current) use of non-steroidal anti-inflammatories (NSAID): Secondary | ICD-10-CM | POA: Insufficient documentation

## 2013-01-23 DIAGNOSIS — Z8619 Personal history of other infectious and parasitic diseases: Secondary | ICD-10-CM | POA: Insufficient documentation

## 2013-01-23 DIAGNOSIS — W108XXA Fall (on) (from) other stairs and steps, initial encounter: Secondary | ICD-10-CM | POA: Insufficient documentation

## 2013-01-23 DIAGNOSIS — Y929 Unspecified place or not applicable: Secondary | ICD-10-CM | POA: Insufficient documentation

## 2013-01-23 DIAGNOSIS — Z79899 Other long term (current) drug therapy: Secondary | ICD-10-CM | POA: Insufficient documentation

## 2013-01-23 DIAGNOSIS — Y939 Activity, unspecified: Secondary | ICD-10-CM | POA: Insufficient documentation

## 2013-01-23 MED ORDER — IBUPROFEN 800 MG PO TABS: 800.0000 mg | ORAL_TABLET | Freq: Three times a day (TID) | ORAL | Status: DC

## 2013-01-23 MED ORDER — HYDROCODONE-ACETAMINOPHEN 5-325 MG PO TABS: 1.0000 | ORAL_TABLET | ORAL | Status: DC | PRN

## 2013-01-23 NOTE — ED Notes (Signed)
Patient states that she feel down and hurt her back a few days again. The patient reports that she continues to have pain

## 2013-01-23 NOTE — ED Provider Notes (Signed)
CSN: [id number]     Arrival date & time 01/23/13  1958 History  This chart was scribed for non-physician practitioner, Kyung Bacca, PA-C,working with Audree Camel, MD, by Karle Plumber, ED Scribe.  This patient was seen in room WTR9/WTR9 and the patient's care was started at 8:18 PM.  Chief Complaint  Patient presents with  . Back Pain   The history is provided by the patient. No language interpreter was used.   HPI Comments:  Rose [name] is a 23 y.o. female who presents to the Emergency Department complaining of worsening, lower back pain that radiates upwards secondary to falling down stairs four days ago. She states she landed on her lower back and buttocks. Pt reports the bruise is worsening as well. She reports associated numbness and paresthesias of lower extremities upon waking for the past two days that last about 20 minutes each time. Pt denies hematuria, bowel or bladder incontinence, and lower extremities weakness. She denies h/o back problems. Pt has h/o asthma. She reports having an appendectomy.   Past Medical History  Diagnosis Date  . Asthma   . Migraine   . Hx of chlamydia infection    Past Surgical History  Procedure Laterality Date  . Eye surgery    . Tympanostomy tube placement    . Lymph node biopsy      Left neck after cat scratch fever  . Laparoscopic appendectomy  02/03/2012    Procedure: APPENDECTOMY LAPAROSCOPIC;  Surgeon: Wilmon Arms. Corliss Skains, MD;  Location: [facility] OR;  Service: General;  Laterality: N/A;  . Appendectomy     No family history on file. History  Substance Use Topics  . Smoking status: Current Every Day Smoker -- 0.50 packs/day    Types: Cigarettes  . Smokeless tobacco: Not on file  . Alcohol Use: Yes   OB History   Grav Para Term Preterm Abortions TAB SAB Ect Mult Living   1 1 1       1      Review of Systems  Genitourinary: Negative for dysuria.  Musculoskeletal: Positive for back pain.  Skin: Negative for color change.   Neurological: Positive for numbness. Negative for weakness.  All other systems reviewed and are negative.    Allergies  Penicillins  Home Medications   Current Outpatient Rx  Name  Route  Sig  Dispense  Refill  . acetaminophen (TYLENOL) 500 MG tablet   Oral   Take 1,000 mg by mouth once as needed for pain. For pain.         . cyclobenzaprine (FLEXERIL) 10 MG tablet   Oral   Take 1 tablet (10 mg total) by mouth once.   20 tablet   0   . ibuprofen (ADVIL,MOTRIN) 800 MG tablet   Oral   Take 800 mg by mouth every 8 (eight) hours as needed.         . naproxen (NAPROSYN) 500 MG tablet   Oral   Take 1 tablet (500 mg total) by mouth once.   30 tablet   0   . promethazine (PHENERGAN) 25 MG tablet   Oral   Take 1 tablet (25 mg total) by mouth every 6 (six) hours as needed for nausea or vomiting (or headache).   8 tablet   0    Triage Vitals: BP 138/68  Pulse 72  Temp(Src) 98.4 F (36.9 C) (Oral)  Resp 18  SpO2 100% Physical Exam  Nursing note and vitals reviewed. Constitutional: She is oriented to  person, place, and time. She appears well-developed and well-nourished.  HENT:  Head: Normocephalic and atraumatic.  Eyes:  Normal appearance  Neck: Normal range of motion.  Cardiovascular: Normal rate and regular rhythm.   Pulmonary/Chest: Effort normal and breath sounds normal.  Genitourinary:  No CVA ttp  Musculoskeletal:  Bony and diffuse soft tissue tenderness from T9-T10 down.  Full active ROM BLE.  5/5 hip abduction/adduction and ankle plantar/dorsiflexion strength. Nml patellar reflexes.  No saddle anesthesia. Distal sensation intact.  2+ DP pulses.  Ambulates w/out diffulty.   Neurological: She is alert and oriented to person, place, and time.  Skin: Skin is warm and dry. No rash noted.  Psychiatric: She has a normal mood and affect. Her behavior is normal.    ED Course  Procedures (including critical care time) DIAGNOSTIC STUDIES: Oxygen Saturation is  100% on RA, normal by my interpretation.   COORDINATION OF CARE: 8:23 PM- Will X-Ray T-Spine and C-Spine and obtain a urine sample. Pt verbalizes understanding and agrees to plan.  Medications - No data to display  Labs Review Labs Reviewed  URINALYSIS, ROUTINE W REFLEX MICROSCOPIC   Imaging Review Dg Thoracic Spine 2 View  01/23/2013   CLINICAL DATA:  Low back pain  EXAM: THORACIC SPINE - 2 VIEW  COMPARISON:  None.  FINDINGS: Normal thoracic kyphosis.  No evidence of fracture or dislocation. Vertebral body heights and intervertebral disc spaces are maintained.  Visualized lungs are clear.  IMPRESSION: Normal thoracic spine radiographs.   Electronically Signed   By: Charline BillsSriyesh  Krishnan M.D.   On: 01/23/2013 21:22   Dg Lumbar Spine Complete  01/23/2013   CLINICAL DATA:  Low back pain  EXAM: LUMBAR SPINE - COMPLETE 4+ VIEW  COMPARISON:  11/30/2012  FINDINGS: Five lumbar type vertebral bodies.  Normal lumbar lordosis.  No adverse fracture dislocation. Vertebral body heights and intervertebral disc spaces are maintained.  Visualized bony pelvis appears intact.  IMPRESSION: Normal lumbar spine radiographs.   Electronically Signed   By: Charline BillsSriyesh  Krishnan M.D.   On: 01/23/2013 21:22    EKG Interpretation   None       MDM   1. Contusion of back    [place] 22yo F presents w/ low back pain s/p mechanical fall onto back on staircase 4 days ago.  Has had paresthesias BLE for 15-20 min upon waking the past 2 days.  On exam, bony and soft-tissue tenderness from ~T9-T10 down, including but no worse at bilateral CVA, no NV deficits BLE, ambulates w/out difficulty.  Xray thoracic and lumbar spine neg for fx/dislocation. U/A ordered to r/o kidney injury but was never sent to lab.  My suspicion is low; injury occurred several days ago, pt has not noticed hematuria, pain worse at mid-line, tenderness diffuse on exam.  Suspect contusion +/- strain.  Paresthesias likely secondary to edema and sleep position; no  sensory deficit on exam.   Pt d/c'd home w/ vicodin and 800mg  ibuprofen and I recommended ice/heat and avoidance of aggravating activities.  Return precautions discussed.   I personally performed the services described in this documentation, which was scribed in my presence. The recorded information has been reviewed and is accurate.    Otilio Miuatherine E Nirali Magouirk, PA-C 01/23/13 2216

## 2013-01-23 NOTE — Discharge Instructions (Signed)
Take vicodin as prescribed for severe pain.  Do not drive within four hours of taking this medication (may cause drowsiness or confusion).   Take ibuprofen as well; up to 800mg  three times a day with food.  Apply heat or an ice pack 2-3 times a day.  Avoid activities that aggravate pain.   Return to the ER if you develop worsening pain, severe weakness in your legs, increased frequency of numbness in legs, blood in urine or loss of control of bladder or bowels.

## 2013-01-24 NOTE — ED Provider Notes (Signed)
Medical screening examination/treatment/procedure(s) were performed by non-physician practitioner and as supervising physician I was immediately available for consultation/collaboration.  EKG Interpretation   None         Chrissi Crow T Karema Tocci, MD 01/24/13 0050 

## 2013-04-15 ENCOUNTER — Emergency Department (HOSPITAL_COMMUNITY): Payer: PRIVATE HEALTH INSURANCE

## 2013-04-15 ENCOUNTER — Emergency Department (HOSPITAL_COMMUNITY)
Admission: EM | Admit: 2013-04-15 | Discharge: 2013-04-15 | Disposition: A | Discharge status: Home / Self Care | Payer: Self-pay | Attending: Emergency Medicine | Admitting: Emergency Medicine

## 2013-04-15 ENCOUNTER — Encounter (HOSPITAL_COMMUNITY): Payer: Self-pay | Admitting: Emergency Medicine

## 2013-04-15 DIAGNOSIS — R1033 Periumbilical pain: Secondary | ICD-10-CM | POA: Insufficient documentation

## 2013-04-15 DIAGNOSIS — R197 Diarrhea, unspecified: Secondary | ICD-10-CM | POA: Insufficient documentation

## 2013-04-15 DIAGNOSIS — R112 Nausea with vomiting, unspecified: Secondary | ICD-10-CM | POA: Insufficient documentation

## 2013-04-15 DIAGNOSIS — Z9089 Acquired absence of other organs: Secondary | ICD-10-CM | POA: Insufficient documentation

## 2013-04-15 DIAGNOSIS — Z8619 Personal history of other infectious and parasitic diseases: Secondary | ICD-10-CM | POA: Insufficient documentation

## 2013-04-15 DIAGNOSIS — R1011 Right upper quadrant pain: Secondary | ICD-10-CM | POA: Insufficient documentation

## 2013-04-15 DIAGNOSIS — F17200 Nicotine dependence, unspecified, uncomplicated: Secondary | ICD-10-CM | POA: Insufficient documentation

## 2013-04-15 DIAGNOSIS — Z8679 Personal history of other diseases of the circulatory system: Secondary | ICD-10-CM | POA: Insufficient documentation

## 2013-04-15 DIAGNOSIS — J45909 Unspecified asthma, uncomplicated: Secondary | ICD-10-CM | POA: Insufficient documentation

## 2013-04-15 DIAGNOSIS — Z3202 Encounter for pregnancy test, result negative: Secondary | ICD-10-CM | POA: Insufficient documentation

## 2013-04-15 DIAGNOSIS — Z880 Allergy status to penicillin: Secondary | ICD-10-CM | POA: Insufficient documentation

## 2013-04-15 DIAGNOSIS — R1012 Left upper quadrant pain: Secondary | ICD-10-CM | POA: Insufficient documentation

## 2013-04-15 DIAGNOSIS — R1013 Epigastric pain: Secondary | ICD-10-CM | POA: Insufficient documentation

## 2013-04-15 LAB — CBC WITH DIFFERENTIAL/PLATELET
BASOS PCT: 0 % (ref 0–1)
Basophils Absolute: 0 10*3/uL (ref 0.0–0.1)
Eosinophils Absolute: 0 10*3/uL (ref 0.0–0.7)
Eosinophils Relative: 0 % (ref 0–5)
HEMATOCRIT: 41.2 % (ref 36.0–46.0)
HEMOGLOBIN: 15.2 g/dL — AB (ref 12.0–15.0)
LYMPHS ABS: 0.5 10*3/uL — AB (ref 0.7–4.0)
Lymphocytes Relative: 5 % — ABNORMAL LOW (ref 12–46)
MCH: 32.3 pg (ref 26.0–34.0)
MCHC: 36.9 g/dL — AB (ref 30.0–36.0)
MCV: 87.5 fL (ref 78.0–100.0)
MONO ABS: 0.5 10*3/uL (ref 0.1–1.0)
MONOS PCT: 6 % (ref 3–12)
NEUTROS ABS: 7.3 10*3/uL (ref 1.7–7.7)
Neutrophils Relative %: 88 % — ABNORMAL HIGH (ref 43–77)
Platelets: 184 10*3/uL (ref 150–400)
RBC: 4.71 MIL/uL (ref 3.87–5.11)
RDW: 12.2 % (ref 11.5–15.5)
WBC: 8.4 10*3/uL (ref 4.0–10.5)

## 2013-04-15 LAB — URINE MICROSCOPIC-ADD ON

## 2013-04-15 LAB — URINALYSIS, ROUTINE W REFLEX MICROSCOPIC
Glucose, UA: NEGATIVE mg/dL
Hgb urine dipstick: NEGATIVE
KETONES UR: 15 mg/dL — AB
NITRITE: NEGATIVE
PH: 5.5 (ref 5.0–8.0)
Protein, ur: NEGATIVE mg/dL
Specific Gravity, Urine: 1.03 (ref 1.005–1.030)
Urobilinogen, UA: 0.2 mg/dL (ref 0.0–1.0)

## 2013-04-15 LAB — COMPREHENSIVE METABOLIC PANEL
ALBUMIN: 4 g/dL (ref 3.5–5.2)
ALT: 13 U/L (ref 0–35)
AST: 15 U/L (ref 0–37)
Alkaline Phosphatase: 58 U/L (ref 39–117)
BUN: 12 mg/dL (ref 6–23)
CO2: 20 mEq/L (ref 19–32)
CREATININE: 0.69 mg/dL (ref 0.50–1.10)
Calcium: 8.9 mg/dL (ref 8.4–10.5)
Chloride: 105 mEq/L (ref 96–112)
GFR calc Af Amer: >90 mL/min (ref >90)
Glucose, Bld: 100 mg/dL — ABNORMAL HIGH (ref 70–99)
Potassium: 3.9 mEq/L (ref 3.7–5.3)
Sodium: 139 mEq/L (ref 137–147)
Total Bilirubin: 1 mg/dL (ref 0.3–1.2)
Total Protein: 6.6 g/dL (ref 6.0–8.3)

## 2013-04-15 LAB — POC OCCULT BLOOD, ED: Fecal Occult Bld: NEGATIVE

## 2013-04-15 LAB — POC URINE PREG, ED: Preg Test, Ur: NEGATIVE

## 2013-04-15 LAB — LIPASE, BLOOD: Lipase: 14 U/L (ref 11–59)

## 2013-04-15 MED ORDER — PROMETHAZINE HCL 25 MG PO TABS: 25.0000 mg | ORAL_TABLET | Freq: Four times a day (QID) | ORAL | Status: DC | PRN

## 2013-04-15 MED ORDER — PANTOPRAZOLE SODIUM 40 MG IV SOLR
40.0000 mg | Freq: Once | INTRAVENOUS | Status: AC
  Administered 2013-04-15: 40 mg via INTRAVENOUS
  Filled 2013-04-15: qty 40

## 2013-04-15 MED ORDER — SUCRALFATE 1 G PO TABS: 1.0000 g | ORAL_TABLET | Freq: Three times a day (TID) | ORAL | Status: DC

## 2013-04-15 MED ORDER — ONDANSETRON HCL 4 MG/2ML IJ SOLN
4.0000 mg | Freq: Once | INTRAMUSCULAR | Status: AC
  Administered 2013-04-15: 4 mg via INTRAVENOUS
  Filled 2013-04-15: qty 2

## 2013-04-15 MED ORDER — HYDROCODONE-ACETAMINOPHEN 5-325 MG PO TABS: ORAL_TABLET | ORAL | Status: DC

## 2013-04-15 MED ORDER — HYDROMORPHONE HCL PF 1 MG/ML IJ SOLN
1.0000 mg | Freq: Once | INTRAMUSCULAR | Status: AC
  Administered 2013-04-15: 1 mg via INTRAVENOUS
  Filled 2013-04-15: qty 1

## 2013-04-15 MED ORDER — OXYCODONE-ACETAMINOPHEN 5-325 MG PO TABS: 1.0000 | ORAL_TABLET | Freq: Four times a day (QID) | ORAL | Status: DC | PRN

## 2013-04-15 MED ORDER — SODIUM CHLORIDE 0.9 % IV BOLUS (SEPSIS)
1000.0000 mL | Freq: Once | INTRAVENOUS | Status: AC
  Administered 2013-04-15: 1000 mL via INTRAVENOUS

## 2013-04-15 MED ORDER — OMEPRAZOLE 20 MG PO CPDR
DELAYED_RELEASE_CAPSULE | ORAL | Status: DC
Start: 2013-04-15 — End: 2017-02-03

## 2013-04-15 MED ORDER — HYDROMORPHONE HCL PF 1 MG/ML IJ SOLN
1.0000 mg | Freq: Once | INTRAMUSCULAR | Status: DC
  Filled 2013-04-15: qty 1

## 2013-04-15 MED ORDER — OXYCODONE-ACETAMINOPHEN 5-325 MG PO TABS
1.0000 | ORAL_TABLET | Freq: Once | ORAL | Status: DC
  Filled 2013-04-15: qty 1

## 2013-04-15 NOTE — ED Provider Notes (Signed)
CSN: [id number]     Arrival date & time 04/15/13  7829 History   First MD Initiated Contact with Patient 04/15/13 0825     Chief Complaint  Patient presents with  . Abdominal Pain  . Emesis  . Diarrhea     (Consider location/radiation/quality/duration/timing/severity/associated sxs/prior Treatment) HPI Comments: Patient with history of appendectomy in 2014 presents with complaint of appetite change since October 2014 and generalized abdominal pain described as burning with associated nausea and vomiting for the past 3 months. The pain does not radiate. It is worse first thing in the morning and also with food. Patient reports decreased appetite because of pain. Nothing makes symptoms better. Patient also has had loose stools. No urinary symptoms. She has not noted blood in her stools but sometimes they are black. She denies heavy NSAID or alcohol use. Patient is a smoker. The onset of this condition was acute. The course is waxing and waning. Aggravating factors: none. Alleviating factors: none.    Patient is a 23 y.o. female presenting with abdominal pain, vomiting, and diarrhea. The history is provided by the patient, medical records and a parent.  Abdominal Pain Associated symptoms: diarrhea, nausea and vomiting   Associated symptoms: no chest pain, no cough, no dysuria, no fever, no sore throat, no vaginal bleeding and no vaginal discharge   Emesis Associated symptoms: abdominal pain and diarrhea   Associated symptoms: no headaches, no myalgias and no sore throat   Diarrhea Associated symptoms: abdominal pain and vomiting   Associated symptoms: no fever, no headaches and no myalgias     Past Medical History  Diagnosis Date  . Asthma   . Migraine   . Hx of chlamydia infection    Past Surgical History  Procedure Laterality Date  . Eye surgery    . Tympanostomy tube placement    . Lymph node biopsy      Left neck after cat scratch fever  . Laparoscopic appendectomy  02/03/2012     Procedure: APPENDECTOMY LAPAROSCOPIC;  Surgeon: Wilmon Arms. Corliss Skains, MD;  Location: [facility] OR;  Service: General;  Laterality: N/A;  . Appendectomy     History reviewed. No pertinent family history. History  Substance Use Topics  . Smoking status: Current Every Day Smoker -- 0.50 packs/day    Types: Cigarettes  . Smokeless tobacco: Not on file  . Alcohol Use: Yes   OB History   Grav Para Term Preterm Abortions TAB SAB Ect Mult Living   1 1 1       1      Review of Systems  Constitutional: Negative for fever.  HENT: Negative for rhinorrhea and sore throat.   Eyes: Negative for redness.  Respiratory: Negative for cough.   Cardiovascular: Negative for chest pain.  Gastrointestinal: Positive for nausea, vomiting, abdominal pain and diarrhea. Negative for blood in stool.  Genitourinary: Negative for dysuria, frequency, flank pain, vaginal bleeding and vaginal discharge.  Musculoskeletal: Negative for myalgias.  Skin: Negative for rash.  Neurological: Negative for headaches.    Allergies  Penicillins  Home Medications   Current Outpatient Rx  Name  Route  Sig  Dispense  Refill  . acetaminophen (TYLENOL) 500 MG tablet   Oral   Take 1,000 mg by mouth once as needed for pain. For pain.         Marland Kitchen ibuprofen (ADVIL,MOTRIN) 800 MG tablet   Oral   Take 800 mg by mouth every 8 (eight) hours as needed.  BP 119/80  Pulse 77  Temp(Src) 97.4 F (36.3 C) (Oral)  Resp 16  SpO2 100%  LMP 03/25/2013  Physical Exam  Nursing note and vitals reviewed. Constitutional: She appears well-developed and well-nourished.  HENT:  Head: Normocephalic and atraumatic.  Eyes: Conjunctivae are normal. Right eye exhibits no discharge. Left eye exhibits no discharge.  Neck: Normal range of motion. Neck supple.  Cardiovascular: Normal rate, regular rhythm and normal heart sounds.   Pulmonary/Chest: Breath sounds normal. No respiratory distress. She has no wheezes. She has no rales.   Abdominal: Soft. She exhibits no distension. Bowel sounds are decreased. There is tenderness (Moderate, R=L) in the right upper quadrant, epigastric area, periumbilical area and left upper quadrant. There is rebound. There is no rigidity, no guarding, no CVA tenderness, no tenderness at McBurney's point and negative Murphy's sign.  Neurological: She is alert.  Skin: Skin is warm and dry.  Psychiatric: She has a normal mood and affect.    ED Course  Procedures (including critical care time) Labs Review Labs Reviewed  CBC WITH DIFFERENTIAL - Abnormal; Notable for the following:    Hemoglobin 15.2 (*)    MCHC 36.9 (*)    Neutrophils Relative % 88 (*)    Lymphocytes Relative 5 (*)    Lymphs Abs 0.5 (*)    All other components within normal limits  COMPREHENSIVE METABOLIC PANEL - Abnormal; Notable for the following:    Glucose, Bld 100 (*)    All other components within normal limits  URINALYSIS, ROUTINE W REFLEX MICROSCOPIC - Abnormal; Notable for the following:    Color, Urine AMBER (*)    APPearance CLOUDY (*)    Bilirubin Urine SMALL (*)    Ketones, ur 15 (*)    Leukocytes, UA SMALL (*)    All other components within normal limits  URINE MICROSCOPIC-ADD ON - Abnormal; Notable for the following:    Squamous Epithelial / LPF FEW (*)    Bacteria, UA MANY (*)    All other components within normal limits  LIPASE, BLOOD  POC URINE PREG, ED  POC OCCULT BLOOD, ED   Imaging Review [place] Abdomen Complete  04/15/2013   CLINICAL DATA:  Upper abdominal pain  EXAM: ULTRASOUND ABDOMEN COMPLETE  COMPARISON:  None.  FINDINGS: Gallbladder:  No gallstones or wall thickening visualized, measuring 2.7 mm. No sonographic Murphy sign noted.  Common bile duct:  Diameter: 2.1 mm  Liver:  No focal lesion identified. Within normal limits in parenchymal echogenicity.  IVC:  No abnormality visualized.  Pancreas:  Visualized portion unremarkable.  Spleen:  Size and appearance within normal limits.  Right Kidney:   Length: 10.7 cm. Echogenicity within normal limits. No mass or hydronephrosis visualized.  Left Kidney:  Length: 10.3 cm. Echogenicity within normal limits. No mass or hydronephrosis visualized.  Abdominal aorta:  No aneurysm visualized.  Other findings:  None.  IMPRESSION: Negative abdominal ultrasound.   Electronically Signed   By: Salome HolmesHector  Cooper M.D.   On: 04/15/2013 10:34     EKG Interpretation None      9:14 AM Patient seen and examined. Work-up initiated. Medications ordered.   Vital signs reviewed and are as follows: Filed Vitals:   04/15/13 0900  BP: 119/80  Pulse: 77  Temp:   Resp:   BP 119/80  Pulse 77  Temp(Src) 97.4 F (36.3 C) (Oral)  Resp 16  SpO2 100%  LMP 03/25/2013  12:17 PM Patient feeling somewhat better after treatment. She is sipping on water in  room without vomiting.  Informed of ultrasound and lab results.  At this point, will start patient on Carafate and PPI. She is also given pain medicine and nausea medicine for home. Gastroenterology referral given. PCP referrals given as well.  The patient was urged to return to the Emergency Department immediately with worsening of current symptoms, worsening abdominal pain, persistent vomiting, blood noted in stools, fever, or any other concerns. The patient verbalized understanding.   Patient counseled on use of narcotic pain medications. Counseled not to combine these medications with others containing tylenol. Urged not to drink alcohol, drive, or perform any other activities that requires focus while taking these medications. The patient verbalizes understanding and agrees with the plan.    MDM   Final diagnoses:  Epigastric pain   Patient with epigastric pain concerning for peptic ulcer disease. Ultrasound today does not demonstrate any cholecystitis or cholelithiasis. Liver enzymes are normal. Do not suspect pancreatitis given exam and normal lipase. Vomiting is controlled here. Patient appears mildly  dehydrated and was treated with IV fluids. Otherwise labs are reassuring. Do not suspect UTI given lack of symptoms and marginal UA. Patient has a history of an appendectomy. Do not suspect bowel obstruction.   Patient is safe for discharge with outpatient evaluation. Strict return instructions given. Patient seems reliable.  No dangerous or life-threatening conditions suspected or identified by history, physical exam, and by work-up. No indications for hospitalization identified.      Renne Crigler, PA-C 04/15/13 1219

## 2013-04-15 NOTE — Progress Notes (Signed)
[facility] provided pt with a list of primary care resources. Patient stated that she was pending insurance through job.  °

## 2013-04-15 NOTE — Discharge Instructions (Signed)
Please read and follow all provided instructions.  Your diagnoses today include:  1. Epigastric pain     Tests performed today include:  Blood counts and electrolytes  Blood tests to check liver and kidney function  Blood tests to check pancreas function  Urine test to look for infection and pregnancy (in women)  Ultrasound - no gallstones or inflamed gallbladder  Vital signs. See below for your results today.   Medications prescribed:   Vicodin (hydrocodone/acetaminophen) - narcotic pain medication  DO NOT drive or perform any activities that require you to be awake and alert because this medicine can make you drowsy. BE VERY CAREFUL not to take multiple medicines containing Tylenol (also called acetaminophen). Doing so can lead to an overdose which can damage your liver and cause liver failure and possibly death.   Carafate - for stomach upset and to protect your stomach   Omeprazole (Prilosec) - stomach acid reducer  This medication can be found over-the-counter   Phenergan (promethazine) - for nausea and vomiting  Take any prescribed medications only as directed.  Home care instructions:   Follow any educational materials contained in this packet.  Follow-up instructions: Please follow-up with your primary care provider in the next 3 days for further evaluation of your symptoms. If you do not have a primary care doctor -- see below for referral information.   Return instructions:  SEEK IMMEDIATE MEDICAL ATTENTION IF:  The pain does not go away or becomes severe   A temperature above 101F develops   Repeated vomiting occurs (multiple episodes)   The pain becomes localized to portions of the abdomen. The right side could possibly be appendicitis. In an adult, the left lower portion of the abdomen could be colitis or diverticulitis.   Blood is being passed in stools or vomit (bright red or black tarry stools)   You develop chest pain, difficulty breathing,  dizziness or fainting, or become confused, poorly responsive, or inconsolable (young children)  If you have any other emergent concerns regarding your health  Additional Information: Abdominal (belly) pain can be caused by many things. Your caregiver performed an examination and possibly ordered blood/urine tests and imaging (CT scan, x-rays, ultrasound). Many cases can be observed and treated at home after initial evaluation in the emergency department. Even though you are being discharged home, abdominal pain can be unpredictable. Therefore, you need a repeated exam if your pain does not resolve, returns, or worsens. Most patients with abdominal pain don't have to be admitted to the hospital or have surgery, but serious problems like appendicitis and gallbladder attacks can start out as nonspecific pain. Many abdominal conditions cannot be diagnosed in one visit, so follow-up evaluations are very important.  Your vital signs today were: BP 90/47   Pulse 63   Temp(Src) 97.4 F (36.3 C) (Oral)   Resp 16   SpO2 99%   LMP 03/25/2013 If your blood pressure (bp) was elevated above 135/85 this visit, please have this repeated by your doctor within one month. --------------  Emergency Department Resource Guide 1) Find a Doctor and Pay Out of Pocket Although you won't have to find out who is covered by your insurance plan, it is a good idea to ask around and get recommendations. You will then need to call the office and see if the doctor you have chosen will accept you as a new patient and what types of options they offer for patients who are self-pay. Some doctors offer discounts or will set  up payment plans for their patients who do not have insurance, but you will need to ask so you aren't surprised when you get to your appointment.  2) Contact Your Local Health Department Not all health departments have doctors that can see patients for sick visits, but many do, so it is worth a call to see if yours  does. If you don't know where your local health department is, you can check in your phone book. The CDC also has a tool to help you locate your state's health department, and many state websites also have listings of all of their local health departments.  3) Find a Walk-in Clinic If your illness is not likely to be very severe or complicated, you may want to try a walk in clinic. These are popping up all over the country in pharmacies, drugstores, and shopping centers. They're usually staffed by nurse practitioners or physician assistants that have been trained to treat common illnesses and complaints. They're usually fairly quick and inexpensive. However, if you have serious medical issues or chronic medical problems, these are probably not your best option.  No Primary Care Doctor: - Call Health Connect at  [phone] - they can help you locate a primary care doctor that  accepts your insurance, provides certain services, etc. - Physician Referral Service- [phone]  Chronic Pain Problems: Organization         Address  Phone   Notes  [facility]  [phone] Patients need to be referred by their primary care doctor.   Medication Assistance: Organization         Address  Phone   Notes  [facility] [address] [place] [place], [place] [address] [phone] --Must be a resident of [facility] -- Must have NO insurance coverage whatsoever (no Medicaid/ Medicare, etc.) -- The pt. MUST have a primary care doctor that directs their care regularly and follows them in the community   [facility]  [phone]   [employer]  [phone]    Agencies that provide inexpensive medical care: Organization         Address  Phone   Notes  [facility]  [phone]   [facility]    [phone]   [facility] [address] [place], [place] [address] [phone]    [facility] [address] [place], [place] [phone]   Planned Parenthood    [phone]   [facility]    [phone]   [facility] and [facility]  [address], [place] Phone:  [phone], Fax:  [phone] Hours of Operation:  9 am - 6 pm, M-F.  Also accepts Medicaid/Medicare and self-pay.  [facility]  [address], [place] Phone: [phone], Fax: [phone]. Hours of Operation:  8:30 am - 5:30 pm, M-F.  Also accepts Medicaid and self-pay.  [facility] [place] [address], [place] Phone: [phone]   [facility] [address] Natasha Bence [place], [place] [phone], Ext. [phone] Mondays & Thursdays: 7-9 AM.  First 15 patients are seen on a first come, first serve basis.    Medicaid-accepting [facility] Providers:  Organization         Address  Phone   Notes  [facility] [address],  [phone] Also  accepts self-pay patients.  [facility] [address] Laurell Josephs [place], [place]  [phone]   [facility] [address], [place] [phone]   [facility] [address] [place] [phone]   Renaye Rakers [address], [place]   [phone] Only accepts [name] Access [place] patients after they have their name applied to their card.   Self-Pay (no insurance) in [facility]:  Organization         Address  Phone   Notes  Sickle Cell Patients, [facility] [address] [place], [place] [phone]   [facility] [address] [place], [place] [phone]   [facility] [place]  [address], [place] [phone]   [facility]/Dr. Osei-Bonsu  [address] [place] or [address], [place] [phone] Phone number for both [place] and [place] locations is the same.  Urgent Medical and [facility] [address], [place] [phone]   [facility] [address] [place] or [address] [phone]   [facility] [address], [place] [phone], phone; [phone], fax Sees patients 1st and 3rd Saturday of every month.  Must not qualify for public or private insurance (i.e. Medicaid, Medicare, [place] Health Choice, Veterans' Benefits)  Household income should be no more than 200% of the poverty level The clinic cannot treat you if you are pregnant or think you are pregnant  Sexually transmitted diseases are not treated at the clinic.    Dental Care: Organization         Address  Phone  Notes  [facility] of [facility] [address] [place], [place] [phone] Accepts children up to age 54 who are enrolled in [place] or [place] Health Choice; pregnant women with a Medicaid card; and children who have applied for Medicaid or [place] Health Choice, but were declined, whose parents can pay a reduced fee at time of service.  [facility] Department of [facility]  [address], [place] [phone] Accepts children up to age 55 who are enrolled in [place] or [place] Health Choice; pregnant women with a Medicaid card; and children who have applied for Medicaid or [place] Health Choice, but were declined, whose parents can pay a reduced fee at time of service.  [facility]  [address] [place], [place] [phone] Patients are seen by appointment only. Walk-ins are not accepted. [facility] will see patients 42 years of age and older. Monday - Tuesday (8am-5pm) Most Wednesdays (8:30-5pm) $30 per visit, cash only  [facility]  [address], [facility] [phone] Patients are seen by  appointment only. Walk-ins are not accepted. [facility] will see patients 4 years of age and older. One Wednesday Evening (Monthly: Volunteer Based).  $30 per visit, cash only  [employer]  [phone] for adults; Children under age 49, call Graduate Pediatric Dentistry at [phone]. Children aged 30-14, please call [phone] to request a pediatric application.  Dental services are provided in all areas of dental care including fillings, crowns and bridges, complete and partial dentures, implants, gum treatment, root canals, and extractions. Preventive care is also provided. Treatment is  provided to both adults and children. Patients are selected via a lottery and there is often a waiting list.   [facility] [address], [place]  [phone] [email or web address]   [facility] [address] [place], [place] [phone], Ext. [phone] Second and Fourth Thursday of each month, opens at 6:30 AM; Clinic ends at 9 AM.  Patients are seen on a first-come first-served basis, and a limited number are seen during each clinic.   [facility]  [address] Ether Griffins [place], [place] [phone]   Eligibility Requirements You must have lived in [place], [place], or [place] counties for at least the last three months.   You cannot be eligible for state or federal sponsored [place], including [employer], [place], or [employer].   You generally cannot be eligible for healthcare insurance through your employer.    How to apply: Eligibility screenings are held every Tuesday and Wednesday afternoon from 1:00 pm until 4:00 pm. You do not need an appointment for the interview!  [facility] [address] [place], [place] [phone]   [facility] Health Department  [phone]   [facility] Health Department  [phone]   [facility] Health Department  [phone]     Behavioral Health Resources in the Community: Intensive Outpatient Programs Organization         Address  Phone  Notes  [facility] [address], [place], [place] [phone]   [facility] [address] [place], [place] [phone]   [facility] [address] [place], [place]  [phone]   [facility] [address]  [place], [place] [phone] or [phone]   Substance Abuse Resources Organization         Address  Phone  Notes  [facility] Drug Services  [phone]   [facility]  [phone]   [facility] [place]  [phone]   Floydene Flock  [phone]   Residential & Outpatient Substance Abuse Program  [phone]   Psychological Services Organization         Address  Phone  Notes  [facility] Behavioral Health  [phone]   [facility]  [phone]   [facility] [address] [place] [phone] or [phone]    Mobile Crisis Teams Organization         Address  Phone  Notes  [facility], [place] Crisis Care Unit  [phone]   [facility]  [address] [place], [place] [phone]   Doristine Locks [address] [place] [phone]    Self-Help/Support Groups Organization         Address  Phone             Notes  [facility] [place] - variety of support groups  [phone]- I7437963 Call for more information  Narcotics Anonymous (NA), [facility] [address], [employer] [place]  2 meetings at this location   Statistician         Address  Phone  Notes  [facility] [address],    [place]  [phone]   [facility]  [address] [name] [address], [place], [place] [phone]   [facility] [address] [place], [place] [phone] Admissions: 8am-3pm M-F  Incentives  [facility] Treatment [facility] [address]    [place], [place]  [phone]   [facility] [address] Jadene Pierini [place], [place]   [facility] [place].,  [place], [place]   [facility] - Intensive Outpatient [address] Kristeen [address], [place], [place]   [facility] [address]  [place], [place] [phone] or [phone]   [facility] ([facility]) [address], [place], [place] Accepts Medicaid  [facility] [place] [address],  [place] [phone] Substance Abuse/Addiction Treatment   [facility] Organization         Address  Phone  Notes  [facility]  [phone]   Domenic Schwab, PhD [address] Arlis Porta [place], [place]   [phone] or [phone]   [facility], [place] [phone]   [facility] [address], [place], [place] [phone] Insurance/Medicaid/sponsorship through [facility] and Families [address] [place]                                    [place], [place] [phone] [place] [address], [place] [phone]    Dr. Adele Schilder  [phone]   [facility] Dept. 1) [address] [place] 2) [place] 3)  [place] [address], [place] [phone]  [phone]   [place] [phone] or [phone] (After Hours)

## 2013-04-15 NOTE — ED Notes (Signed)
Pt given ice water to encouraged fluid challenge. 

## 2013-04-15 NOTE — ED Notes (Signed)
Patient c/o 5/10 pain. Dilaudid ordered but not given. BP 100/55 MAP 64. PA aware. Percocet ordered. Patient states she starts "itching" with this medication. Vicodin ordered for patient as prescription and AVS given to patient.

## 2013-04-15 NOTE — ED Notes (Signed)
US at bedside

## 2013-04-15 NOTE — ED Notes (Signed)
edpa at the bedside.

## 2013-04-15 NOTE — ED Provider Notes (Signed)
Medical screening examination/treatment/procedure(s) were performed by non-physician practitioner and as supervising physician I was immediately available for consultation/collaboration.   EKG Interpretation None        Shanna CiscoMegan E Docherty, MD 04/15/13 2113

## 2013-04-15 NOTE — ED Notes (Signed)
Pt c/o generalized abdominal pain, emesis, and diarrhea x 2 months.  Pain score 8/10.  Pt reports that the pain is "like a burning and she vomits every time she eats."  Sts eating makes the pain worse.

## 2013-04-16 ENCOUNTER — Telehealth: Payer: Self-pay | Admitting: Internal Medicine

## 2013-04-16 NOTE — Telephone Encounter (Signed)
Patient is scheduled for Mike Gipmy [place]ster[place] PA on 04/20/13 1:30

## 2013-04-20 ENCOUNTER — Encounter: Payer: Self-pay | Admitting: Physician Assistant

## 2013-04-20 ENCOUNTER — Ambulatory Visit (INDEPENDENT_AMBULATORY_CARE_PROVIDER_SITE_OTHER): Payer: Self-pay | Admitting: Physician Assistant

## 2013-04-20 VITALS — BP 122/78 | HR 82 | Ht 66.0 in | Wt 185.4 lb

## 2013-04-20 DIAGNOSIS — R112 Nausea with vomiting, unspecified: Secondary | ICD-10-CM

## 2013-04-20 DIAGNOSIS — R197 Diarrhea, unspecified: Secondary | ICD-10-CM

## 2013-04-20 DIAGNOSIS — R109 Unspecified abdominal pain: Secondary | ICD-10-CM

## 2013-04-20 MED ORDER — PROMETHAZINE HCL 25 MG PO TABS: 25.0000 mg | ORAL_TABLET | Freq: Four times a day (QID) | ORAL | Status: DC | PRN

## 2013-04-20 MED ORDER — CILIDINIUM-CHLORDIAZEPOXIDE 2.5-5 MG PO CAPS: 1.0000 | ORAL_CAPSULE | Freq: Three times a day (TID) | ORAL | Status: DC

## 2013-04-20 MED ORDER — PANTOPRAZOLE SODIUM 40 MG IV SOLR: 40.0000 mg | Freq: Once | INTRAVENOUS | Status: DC

## 2013-04-20 NOTE — Patient Instructions (Signed)
You have been scheduled for a CT scan of the abdomen and pelvis at [facility] ([address]this is in the same building as Press photographer).   You are scheduled on 04/23/2013 at 9:30. You should arrive 15 minutes prior to your appointment time for registration. Please follow the written instructions below on the day of your exam:  WARNING: IF YOU ARE ALLERGIC TO IODINE/X-RAY DYE, PLEASE NOTIFY RADIOLOGY IMMEDIATELY AT [phone]! YOU WILL BE GIVEN A 13 HOUR PREMEDICATION PREP.  1) Do not eat or drink anything after 5:30am (4 hours prior to your test) 2) You have been given 2 bottles of oral contrast to drink. The solution may taste  better if refrigerated, but do NOT add ice or any other liquid to this solution. Shake  well before drinking.    Drink 1 bottle of contrast @ 7:30 (2 hours prior to your exam)  Drink 1 bottle of contrast @ 8:30 (1 hour prior to your exam)  You may take any medications as prescribed with a small amount of water except for the following: Metformin, Glucophage, Glucovance, Avandamet, Riomet, Fortamet, Actoplus Met, Janumet, Glumetza or Metaglip. The above medications must be held the day of the exam AND 48 hours after the exam.  The purpose of you drinking the oral contrast is to aid in the visualization of your intestinal tract. The contrast solution may cause some diarrhea. Before your exam is started, you will be given a small amount of fluid to drink. Depending on your individual set of symptoms, you may also receive an intravenous injection of x-ray contrast/dye. Plan on being at [facility] for 30 minutes or long, depending on the type of exam you are having performed.  This test typically takes 30-45 minutes to complete.  If you have any questions regarding your exam or if you need to reschedule, you may call the CT department at [phone] between the hours of 8:00 am and 5:00 pm, Monday-Friday.  We have sent the following  medications to your pharmacy for you to pick up at your convenience: Continue prilosec, we sent librax, and phenergan to your [facility] pharmacy.    ________________________________________________________________________

## 2013-04-20 NOTE — Progress Notes (Signed)
Subjective:    Patient ID: Rose [name], female    DOB: 04-14-1990, 23 y.o.   MRN: [id number]  HPI Rose [name] is a pleasant 23 year old white female new to GI today he was referred to GI after recent ER visits for abdominal pain nausea and vomiting. He is generally in good health, she is status post appendectomy in 2014 for acute appendicitis. She states that her current problems have been ongoing since October of 2014. She complains of generalized abdominal discomfort which has been persistent and fairly constant present on a daily basis. She says the pain is usually worse in the morning and described it as a burning aching type of discomfort which is definitely worse postprandially. She says sometimes after eating she will immediately have nausea and vomiting. She has been vomiting on an almost daily basis and says she's nauseated most of the time. She has not had any fever or chills. No significant weight loss.  Patient says that her bowel habits have changed as well she may go a couple of days with no bowel movement for which she does have a bowel movement since his stool is always loose. She may have several episodes of loose stools in one day once her bowels start moving. She's not had any melena or hematochezia. With recent ER visit on 04/15/2012 CBC [place] met were unremarkable pregnancy test was negative. Upper abdominal ultrasound was unremarkable. Last CT scan was done in January 2014 at which time she had an acute appendicitis. Patient relates that she had been taking a lot of anti-inflammatories over the past few months and primarily for abdominal pain. She admits that she been taking several ibuprofen per day until she went to the emergency room and they told her to stop this. She was given Phenergan, hydrocodone, and Prilosec 20 mg twice daily in addition to Carafate by mouth 4 times daily. She says thus far the medications have not made any difference over the past few days.    Review  of Systems  Constitutional: Negative.   HENT: Negative.   Eyes: Negative.   Respiratory: Negative.   Cardiovascular: Negative.   Gastrointestinal: Positive for nausea, vomiting, abdominal pain and diarrhea.  Endocrine: Negative.   Genitourinary: Negative.   Musculoskeletal: Negative.   Allergic/Immunologic: Negative.   Neurological: Negative.   Hematological: Negative.   Psychiatric/Behavioral: Negative.    Outpatient Prescriptions Prior to Visit  Medication Sig Dispense Refill  . HYDROcodone-acetaminophen (NORCO/VICODIN) 5-325 MG per tablet Take 1-2 tablets every 6 hours as needed for severe pain  15 tablet  0  . omeprazole (PRILOSEC) 20 MG capsule Take one capsule PO twice a day for 3 days, then one capsule PO once a day.  20 capsule  0  . sucralfate (CARAFATE) 1 G tablet Take 1 tablet (1 g total) by mouth 4 (four) times daily -  with meals and at bedtime.  60 tablet  0  . promethazine (PHENERGAN) 25 MG tablet Take 1 tablet (25 mg total) by mouth every 6 (six) hours as needed for nausea or vomiting.  10 tablet  0  . acetaminophen (TYLENOL) 500 MG tablet Take 1,000 mg by mouth once as needed for pain. For pain.      Marland Kitchen ibuprofen (ADVIL,MOTRIN) 800 MG tablet Take 800 mg by mouth every 8 (eight) hours as needed.       No facility-administered medications prior to visit.   Allergies  Allergen Reactions  . Penicillins Hives  Patient Active Problem List   Diagnosis Date Noted  . Acute appendicitis 02/03/2012  . Chlamydia 01/10/2012   History  Substance Use Topics  . Smoking status: Current Every Day Smoker -- 0.50 packs/day    Types: Cigarettes  . Smokeless tobacco: Not on file  . Alcohol Use: Yes   family history is not on file.  Objective:   Physical Exam well-developed white female in no acute distress, H. his blood pressure 122/78 pulse 82 height 5 foot 6 weight 185. HEENT ;nontraumatic normocephalic EOMI PERRLA sclera anicteric, Supple; no JVD, Cardiovascular;  regular rate and rhythm with S1-S2 no murmur or gallop, Pulmonary; clear bilaterally, Abdomen; large soft she is tender most markedly in the right mid quadrant right lower quadrant is no guarding or rebound no palpable mass or hepatosplenomegaly bowel sounds are present, Rectal; exam not done, Extremities ;no clubbing cyanosis or edema skin warm and dry, Psych; mood and affect appropriate      Assessment & Plan: 77 #65  #38 23 year old female with 6-7 month history of generalized abdominal discomfort change in bowel habits with diarrhea and frequent nausea with vomiting. Etiology is unclear. Will need to rule out IBD, versus IBS. Also consider an NSAID-induced gastropathy or peptic ulcer disease #2 status post appendectomy January 2014  Plan; Schedule for CT scan of the abdomen and pelvis with contrast. Depending on results of CT she may require endoscopic evaluation with EGD and colonoscopy. continue Prilosec 20 mg by mouth twice daily Refill Phenergan 12.5-25 mg every 6 hours when necessary nausea Add Librax 1 by mouth every 6-8 hours as needed for cramping/spasm Stop all NSAID use She will be establish with Dr. Ardis Hughs

## 2013-04-21 NOTE — Progress Notes (Signed)
I agree the the above plan, unclear etiology of her pains.  Starting workup with imaging

## 2013-04-23 ENCOUNTER — Telehealth: Payer: Self-pay | Admitting: Physician Assistant

## 2013-04-23 ENCOUNTER — Ambulatory Visit (INDEPENDENT_AMBULATORY_CARE_PROVIDER_SITE_OTHER)
Admission: RE | Admit: 2013-04-23 | Discharge: 2013-04-23 | Disposition: A | Discharge status: Home / Self Care | Payer: PRIVATE HEALTH INSURANCE | Source: Ambulatory Visit | Attending: Physician Assistant | Admitting: Physician Assistant

## 2013-04-23 ENCOUNTER — Other Ambulatory Visit: Payer: Self-pay | Admitting: *Deleted

## 2013-04-23 DIAGNOSIS — R109 Unspecified abdominal pain: Secondary | ICD-10-CM

## 2013-04-23 DIAGNOSIS — R112 Nausea with vomiting, unspecified: Secondary | ICD-10-CM

## 2013-04-23 MED ORDER — CIPROFLOXACIN HCL 500 MG PO TABS: ORAL_TABLET | ORAL | Status: DC

## 2013-04-23 MED ORDER — IOHEXOL 300 MG/ML  SOLN
100.0000 mL | Freq: Once | INTRAMUSCULAR | Status: AC | PRN
  Administered 2013-04-23: 100 mL via INTRAVENOUS

## 2013-04-23 NOTE — Telephone Encounter (Signed)
Spoke with patient and gave her CT results and recommendation. Rx for Cipro sent.

## 2013-07-26 ENCOUNTER — Other Ambulatory Visit: Payer: Self-pay | Admitting: Obstetrics and Gynecology

## 2013-07-27 LAB — CYTOLOGY - PAP

## 2013-08-30 ENCOUNTER — Other Ambulatory Visit: Payer: Self-pay | Admitting: Obstetrics and Gynecology

## 2013-09-11 ENCOUNTER — Emergency Department (HOSPITAL_COMMUNITY)
Admission: EM | Admit: 2013-09-11 | Discharge: 2013-09-11 | Disposition: A | Discharge status: Home / Self Care | Payer: PRIVATE HEALTH INSURANCE | Source: Home / Self Care | Attending: Family Medicine | Admitting: Family Medicine

## 2013-09-11 ENCOUNTER — Encounter (HOSPITAL_COMMUNITY): Payer: Self-pay | Admitting: Emergency Medicine

## 2013-09-11 DIAGNOSIS — J410 Simple chronic bronchitis: Secondary | ICD-10-CM

## 2013-09-11 DIAGNOSIS — J40 Bronchitis, not specified as acute or chronic: Principal | ICD-10-CM

## 2013-09-11 DIAGNOSIS — Z720 Tobacco use: Secondary | ICD-10-CM

## 2013-09-11 MED ORDER — LEVOFLOXACIN 500 MG PO TABS: 500.0000 mg | ORAL_TABLET | Freq: Every day | ORAL | Status: DC

## 2013-09-11 NOTE — ED Provider Notes (Addendum)
CSN: [id number]     Arrival date & time 09/11/13  1224 History   None    Chief Complaint  Patient presents with  . Cough   (Consider location/radiation/quality/duration/timing/severity/associated sxs/prior Treatment) Patient is a 23 y.o. female presenting with cough. The history is provided by the patient.  Cough Cough characteristics:  Productive Sputum characteristics:  Yellow Severity:  Moderate Onset quality:  Gradual Duration:  1 week Progression:  Worsening Chronicity:  New Smoker: yes   Context: upper respiratory infection   Ineffective treatments:  Cough suppressants Associated symptoms: no fever, no rhinorrhea, no shortness of breath and no wheezing     Past Medical History  Diagnosis Date  . Asthma   . Migraine   . Hx of chlamydia infection    Past Surgical History  Procedure Laterality Date  . Eye surgery    . Tympanostomy tube placement    . Lymph node biopsy      Left neck after cat scratch fever  . Laparoscopic appendectomy  02/03/2012    Procedure: APPENDECTOMY LAPAROSCOPIC;  Surgeon: Wilmon Arms. Corliss Skains, MD;  Location: [facility] OR;  Service: General;  Laterality: N/A;  . Appendectomy     History reviewed. No pertinent family history. History  Substance Use Topics  . Smoking status: Current Every Day Smoker -- 0.50 packs/day    Types: Cigarettes  . Smokeless tobacco: Not on file  . Alcohol Use: Yes   OB History   Grav Para Term Preterm Abortions TAB SAB Ect Mult Living   Review of Systems  Constitutional: Negative.  Negative for fever.  HENT: Negative for rhinorrhea.   Respiratory: Positive for cough. Negative for shortness of breath and wheezing.   Cardiovascular: Negative.   Gastrointestinal: Negative.     Allergies  Penicillins  Home Medications   Prior to Admission medications   Medication Sig Start Date End Date Taking? Authorizing Provider  ciprofloxacin (CIPRO) 500 MG tablet Take one po BID x 7 days 04/23/13   Amy S  Esterwood, PA-C  clidinium-chlordiazePOXIDE (LIBRAX) 5-2.5 MG per capsule Take 1 capsule by mouth 3 (three) times daily before meals. 04/20/13   Amy S Esterwood, PA-C  HYDROcodone-acetaminophen (NORCO/VICODIN) 5-325 MG per tablet Take 1-2 tablets every 6 hours as needed for severe pain 04/15/13   Renne Crigler, PA-C  levofloxacin (LEVAQUIN) 500 MG tablet Take 1 tablet (500 mg total) by mouth daily. 09/11/13   Linna Hoff, MD  omeprazole (PRILOSEC) 20 MG capsule Take one capsule PO twice a day for 3 days, then one capsule PO once a day. 04/15/13   Renne Crigler, PA-C  promethazine (PHENERGAN) 25 MG tablet Take 1 tablet (25 mg total) by mouth every 6 (six) hours as needed for nausea or vomiting. 04/20/13   Amy S Esterwood, PA-C  sucralfate (CARAFATE) 1 G tablet Take 1 tablet (1 g total) by mouth 4 (four) times daily -  with meals and at bedtime. 04/15/13   Renne Crigler, PA-C   LMP 08/20/2013 Physical Exam  Nursing note and vitals reviewed. Constitutional: She is oriented to person, place, and time. She appears well-developed and well-nourished. No distress.  HENT:  Head: Normocephalic.  Right Ear: External ear normal.  Left Ear: External ear normal.  Mouth/Throat: Oropharynx is clear and moist.  Eyes: Conjunctivae are normal. Pupils are equal, round, and reactive to light.  Neck: Normal range of motion. Neck supple.  Cardiovascular: Normal heart sounds and  intact distal pulses.   Pulmonary/Chest: She has no wheezes. She has rhonchi. She has no rales. She exhibits tenderness.  Lymphadenopathy:    She has no cervical adenopathy.  Neurological: She is alert and oriented to person, place, and time.  Skin: Skin is warm and dry.    ED Course  Procedures (including critical care time) Labs Review Labs Reviewed - No data to display  Imaging Review No results found.   MDM   1. Bronchitis due to tobacco use       Linna Hoff, MD 09/11/13 1257  Linna Hoff, MD 09/11/13 [phone]

## 2013-09-11 NOTE — ED Notes (Signed)
Pt  Reports  Symptoms  Of  Cough  /  Congested       Pain in  Chest  After  Coughing          Symptoms  X  4  Days

## 2013-09-11 NOTE — Discharge Instructions (Signed)
Take all of medicine, drink lots of fluids, no more smoking, see your doctor if further problems  °

## 2013-11-08 ENCOUNTER — Encounter (HOSPITAL_COMMUNITY): Payer: Self-pay | Admitting: Emergency Medicine

## 2014-02-16 ENCOUNTER — Encounter (HOSPITAL_COMMUNITY): Payer: Self-pay | Admitting: Emergency Medicine

## 2014-02-16 ENCOUNTER — Emergency Department (HOSPITAL_COMMUNITY)
Admission: EM | Admit: 2014-02-16 | Discharge: 2014-02-16 | Disposition: A | Discharge status: Home / Self Care | Payer: PRIVATE HEALTH INSURANCE | Attending: Emergency Medicine | Admitting: Emergency Medicine

## 2014-02-16 DIAGNOSIS — Z3202 Encounter for pregnancy test, result negative: Secondary | ICD-10-CM | POA: Diagnosis not present

## 2014-02-16 DIAGNOSIS — N390 Urinary tract infection, site not specified: Secondary | ICD-10-CM | POA: Insufficient documentation

## 2014-02-16 DIAGNOSIS — R109 Unspecified abdominal pain: Secondary | ICD-10-CM | POA: Diagnosis present

## 2014-02-16 DIAGNOSIS — Z8619 Personal history of other infectious and parasitic diseases: Secondary | ICD-10-CM | POA: Insufficient documentation

## 2014-02-16 DIAGNOSIS — J45909 Unspecified asthma, uncomplicated: Secondary | ICD-10-CM | POA: Insufficient documentation

## 2014-02-16 DIAGNOSIS — Z79899 Other long term (current) drug therapy: Secondary | ICD-10-CM | POA: Insufficient documentation

## 2014-02-16 DIAGNOSIS — Z8679 Personal history of other diseases of the circulatory system: Secondary | ICD-10-CM | POA: Insufficient documentation

## 2014-02-16 DIAGNOSIS — R112 Nausea with vomiting, unspecified: Secondary | ICD-10-CM | POA: Insufficient documentation

## 2014-02-16 DIAGNOSIS — Z880 Allergy status to penicillin: Secondary | ICD-10-CM | POA: Insufficient documentation

## 2014-02-16 DIAGNOSIS — Z720 Tobacco use: Secondary | ICD-10-CM | POA: Diagnosis not present

## 2014-02-16 LAB — CBC WITH DIFFERENTIAL/PLATELET
Basophils Absolute: 0 10*3/uL (ref 0.0–0.1)
Basophils Relative: 0 % (ref 0–1)
EOS PCT: 1 % (ref 0–5)
Eosinophils Absolute: 0.1 10*3/uL (ref 0.0–0.7)
HCT: 40.7 % (ref 36.0–46.0)
Hemoglobin: 14.2 g/dL (ref 12.0–15.0)
LYMPHS ABS: 1.2 10*3/uL (ref 0.7–4.0)
Lymphocytes Relative: 17 % (ref 12–46)
MCH: 32.3 pg (ref 26.0–34.0)
MCHC: 34.9 g/dL (ref 30.0–36.0)
MCV: 92.5 fL (ref 78.0–100.0)
Monocytes Absolute: 0.5 10*3/uL (ref 0.1–1.0)
Monocytes Relative: 7 % (ref 3–12)
Neutro Abs: 5.6 10*3/uL (ref 1.7–7.7)
Neutrophils Relative %: 75 % (ref 43–77)
PLATELETS: 210 10*3/uL (ref 150–400)
RBC: 4.4 MIL/uL (ref 3.87–5.11)
RDW: 12.2 % (ref 11.5–15.5)
WBC: 7.4 10*3/uL (ref 4.0–10.5)

## 2014-02-16 LAB — URINALYSIS, ROUTINE W REFLEX MICROSCOPIC
BILIRUBIN URINE: NEGATIVE
Glucose, UA: NEGATIVE mg/dL
HGB URINE DIPSTICK: NEGATIVE
Ketones, ur: NEGATIVE mg/dL
Nitrite: NEGATIVE
PROTEIN: NEGATIVE mg/dL
Specific Gravity, Urine: 1.029 (ref 1.005–1.030)
UROBILINOGEN UA: 0.2 mg/dL (ref 0.0–1.0)
pH: 6.5 (ref 5.0–8.0)

## 2014-02-16 LAB — COMPREHENSIVE METABOLIC PANEL
ALK PHOS: 51 U/L (ref 39–117)
ALT: 23 U/L (ref 0–35)
AST: 26 U/L (ref 0–37)
Albumin: 4.5 g/dL (ref 3.5–5.2)
Anion gap: 6 (ref 5–15)
BILIRUBIN TOTAL: 0.9 mg/dL (ref 0.3–1.2)
BUN: 9 mg/dL (ref 6–23)
CHLORIDE: 106 mmol/L (ref 96–112)
CO2: 26 mmol/L (ref 19–32)
Calcium: 9.1 mg/dL (ref 8.4–10.5)
Creatinine, Ser: 0.65 mg/dL (ref 0.50–1.10)
GLUCOSE: 91 mg/dL (ref 70–99)
POTASSIUM: 3.8 mmol/L (ref 3.5–5.1)
Sodium: 138 mmol/L (ref 135–145)
Total Protein: 7.1 g/dL (ref 6.0–8.3)

## 2014-02-16 LAB — URINE MICROSCOPIC-ADD ON

## 2014-02-16 LAB — LIPASE, BLOOD: Lipase: 19 U/L (ref 11–59)

## 2014-02-16 LAB — PREGNANCY, URINE: Preg Test, Ur: NEGATIVE

## 2014-02-16 MED ORDER — ONDANSETRON 4 MG PO TBDP: 4.0000 mg | ORAL_TABLET | Freq: Three times a day (TID) | ORAL | Status: DC | PRN

## 2014-02-16 MED ORDER — MORPHINE SULFATE 4 MG/ML IJ SOLN
4.0000 mg | Freq: Once | INTRAMUSCULAR | Status: AC
  Administered 2014-02-16: 4 mg via INTRAVENOUS
  Filled 2014-02-16: qty 1

## 2014-02-16 MED ORDER — SULFAMETHOXAZOLE-TRIMETHOPRIM 800-160 MG PO TABS: 1.0000 | ORAL_TABLET | Freq: Two times a day (BID) | ORAL | Status: DC

## 2014-02-16 MED ORDER — ONDANSETRON HCL 4 MG/2ML IJ SOLN
4.0000 mg | Freq: Once | INTRAMUSCULAR | Status: AC
  Administered 2014-02-16: 4 mg via INTRAVENOUS
  Filled 2014-02-16: qty 2

## 2014-02-16 MED ORDER — HYDROCODONE-ACETAMINOPHEN 5-325 MG PO TABS: 2.0000 | ORAL_TABLET | ORAL | Status: DC | PRN

## 2014-02-16 MED ORDER — SODIUM CHLORIDE 0.9 % IV BOLUS (SEPSIS)
1000.0000 mL | Freq: Once | INTRAVENOUS | Status: AC
  Administered 2014-02-16: 1000 mL via INTRAVENOUS

## 2014-02-16 NOTE — ED Provider Notes (Signed)
CSN: [id number]     Arrival date & time 02/16/14  1006 History   First MD Initiated Contact with Patient 02/16/14 1026     Chief Complaint  Patient presents with  . Abdominal Pain     (Consider location/radiation/quality/duration/timing/severity/associated sxs/prior Treatment) HPI Comments: Patient is a 24 year old female with a past medical history of chronic abdominal pain and asthma who presents with worsening abdominal pain for the past 2 days. The pain is located in the left abdomen and does not radiate. The pain is described as aching and severe. The pain started gradually and progressively worsened since the onset. No alleviating/aggravating factors. The patient has tried ibuprofen and tylenol for symptoms without relief. Associated symptoms include nausea and vomiting. Patient denies fever, headache, diarrhea, chest pain, SOB, dysuria, constipation, abnormal vaginal bleeding/discharge. Patient denies history of abdominal surgery. Patient supposed to follow up with GI but was unable to do so because the co-pay was too expensive.     Past Medical History  Diagnosis Date  . Asthma   . Migraine   . Hx of chlamydia infection    Past Surgical History  Procedure Laterality Date  . Eye surgery    . Tympanostomy tube placement    . Lymph node biopsy      Left neck after cat scratch fever  . Laparoscopic appendectomy  02/03/2012    Procedure: APPENDECTOMY LAPAROSCOPIC;  Surgeon: Wilmon Arms. Corliss Skains, MD;  Location: [facility] OR;  Service: General;  Laterality: N/A;  . Appendectomy     No family history on file. History  Substance Use Topics  . Smoking status: Current Every Day Smoker -- 0.50 packs/day    Types: Cigarettes  . Smokeless tobacco: Not on file  . Alcohol Use: Yes   OB History    Gravida Para Term Preterm AB TAB SAB Ectopic Multiple Living   Review of Systems  Constitutional: Negative for fever, chills and fatigue.  HENT: Negative for trouble swallowing.    Eyes: Negative for visual disturbance.  Respiratory: Negative for shortness of breath.   Cardiovascular: Negative for chest pain and palpitations.  Gastrointestinal: Positive for nausea, vomiting and abdominal pain. Negative for diarrhea.  Genitourinary: Negative for dysuria and difficulty urinating.  Musculoskeletal: Negative for arthralgias and neck pain.  Skin: Negative for color change.  Neurological: Negative for dizziness and weakness.  Psychiatric/Behavioral: Negative for dysphoric mood.      Allergies  Penicillins  Home Medications   Prior to Admission medications   Medication Sig Start Date End Date Taking? Authorizing Provider  acetaminophen (TYLENOL) 500 MG tablet Take 1,000 mg by mouth every 6 (six) hours as needed for moderate pain (pain).   Yes Historical Provider, MD  ibuprofen (ADVIL,MOTRIN) 200 MG tablet Take 400-600 mg by mouth every 6 (six) hours as needed for moderate pain (pain).   Yes Historical Provider, MD  omeprazole (PRILOSEC) 20 MG capsule Take one capsule PO twice a day for 3 days, then one capsule PO once a day. Patient taking differently: Take 20 mg by mouth daily.  04/15/13  Yes Renne Crigler, PA-C  ciprofloxacin (CIPRO) 500 MG tablet Take one po BID x 7 days Patient not taking: Reported on 02/16/2014 04/23/13   Amy S Esterwood, PA-C  clidinium-chlordiazePOXIDE (LIBRAX) 5-2.5 MG per capsule Take 1 capsule by mouth 3 (three) times daily before meals. Patient not taking: Reported on 02/16/2014 04/20/13   Sammuel Cooper, PA-C  HYDROcodone-acetaminophen (NORCO/VICODIN) 5-325 MG per tablet Take 1-2 tablets every 6 hours as needed for severe pain Patient not taking: Reported on 02/16/2014 04/15/13   Renne CriglerJoshua Geiple, PA-C  levofloxacin (LEVAQUIN) 500 MG tablet Take 1 tablet (500 mg total) by mouth daily. Patient not taking: Reported on 02/16/2014 09/11/13   Linna HoffJames D Kindl, MD  promethazine (PHENERGAN) 25 MG tablet Take 1 tablet (25 mg total) by mouth every 6 (six) hours as  needed for nausea or vomiting. Patient not taking: Reported on 02/16/2014 04/20/13   Amy S Esterwood, PA-C  sucralfate (CARAFATE) 1 G tablet Take 1 tablet (1 g total) by mouth 4 (four) times daily -  with meals and at bedtime. Patient not taking: Reported on 02/16/2014 04/15/13   Renne CriglerJoshua Geiple, PA-C   BP 145/70 mmHg  Pulse 77  Temp(Src) 98.1 F (36.7 C) (Oral)  Resp 18  SpO2 100%  LMP 01/26/2014 Physical Exam  Constitutional: She is oriented to person, place, and time. She appears well-developed and well-nourished. No distress.  HENT:  Head: Normocephalic and atraumatic.  Eyes: Conjunctivae and EOM are normal.  Neck: Normal range of motion.  Cardiovascular: Normal rate and regular rhythm.  Exam reveals no gallop and no friction rub.   No murmur heard. Pulmonary/Chest: Effort normal and breath sounds normal. She has no wheezes. She has no rales. She exhibits no tenderness.  Abdominal: Soft. She exhibits no distension. There is tenderness. There is no rebound.  Mild left side abdominal tenderness to palpation. No focal tenderness or peritoneal signs.   Genitourinary:  No CVA tenderness.   Musculoskeletal: Normal range of motion.  Neurological: She is alert and oriented to person, place, and time. Coordination normal.  Speech is goal-oriented. Moves limbs without ataxia.   Skin: Skin is warm and dry.  Psychiatric: She has a normal mood and affect. Her behavior is normal.  Nursing note and vitals reviewed.   ED Course  Procedures (including critical care time) Labs Review Labs Reviewed  URINALYSIS, ROUTINE W REFLEX MICROSCOPIC - Abnormal; Notable for the following:    Color, Urine AMBER (*)    APPearance CLOUDY (*)    Leukocytes, UA SMALL (*)    All other components within normal limits  URINE MICROSCOPIC-ADD ON - Abnormal; Notable for the following:    Squamous Epithelial / LPF FEW (*)    Bacteria, UA MANY (*)    All other components within normal limits  CBC WITH  DIFFERENTIAL/PLATELET  COMPREHENSIVE METABOLIC PANEL  LIPASE, BLOOD  PREGNANCY, URINE    Imaging Review No results found.   EKG Interpretation None      MDM   Final diagnoses:  UTI (lower urinary tract infection)    11:31 AM Labs and urinalysis pending. Vitals stable and patient afebrile.   1:36 PM Patient's labs unremarkable for acute changes. Urinalysis shows UTI. Patient has no focal tenderness to palpation of abdomen. Patient will be treated with keflex and pain medication. Vitals stable and patient afebrile. Patient instructed to return with worsening or concerning symptoms. No focal tenderness to indicated CT scan at this time.    Emilia BeckKaitlyn Ivania Teagarden, PA-C 02/16/14 1348  Vida RollerBrian D Miller, MD 02/17/14 [phone]

## 2014-02-16 NOTE — Discharge Instructions (Signed)
Take bactrim as directed until gone. Take zofran as needed for nausea. Take Bactrim as directed until gone. Refer to attached documents for more information. Return to the ED with worsening or concerning symptoms.

## 2014-02-16 NOTE — ED Notes (Signed)
Pt reports abdominal pain x 1 year and was told she "possbily has chrons disease but pt did not follow up with GI because she was unable to afford. Pt reports left sided pain 7/10 with vomit after each times she eats. Denies diarrhea. No bowel movement x 3 days.

## 2014-04-07 ENCOUNTER — Other Ambulatory Visit: Payer: Self-pay | Admitting: Obstetrics and Gynecology

## 2014-04-26 ENCOUNTER — Emergency Department (HOSPITAL_COMMUNITY)
Admission: EM | Admit: 2014-04-26 | Discharge: 2014-04-27 | Disposition: A | Discharge status: Home / Self Care | Payer: PRIVATE HEALTH INSURANCE | Attending: Emergency Medicine | Admitting: Emergency Medicine

## 2014-04-26 ENCOUNTER — Emergency Department (HOSPITAL_COMMUNITY): Payer: PRIVATE HEALTH INSURANCE

## 2014-04-26 DIAGNOSIS — J45909 Unspecified asthma, uncomplicated: Secondary | ICD-10-CM | POA: Diagnosis not present

## 2014-04-26 DIAGNOSIS — Z720 Tobacco use: Secondary | ICD-10-CM | POA: Diagnosis not present

## 2014-04-26 DIAGNOSIS — G43419 Hemiplegic migraine, intractable, without status migrainosus: Secondary | ICD-10-CM | POA: Insufficient documentation

## 2014-04-26 DIAGNOSIS — Z793 Long term (current) use of hormonal contraceptives: Secondary | ICD-10-CM | POA: Diagnosis not present

## 2014-04-26 DIAGNOSIS — R200 Anesthesia of skin: Secondary | ICD-10-CM

## 2014-04-26 DIAGNOSIS — Z880 Allergy status to penicillin: Secondary | ICD-10-CM | POA: Diagnosis not present

## 2014-04-26 DIAGNOSIS — R208 Other disturbances of skin sensation: Secondary | ICD-10-CM | POA: Diagnosis not present

## 2014-04-26 DIAGNOSIS — R51 Headache: Secondary | ICD-10-CM | POA: Diagnosis present

## 2014-04-26 DIAGNOSIS — Z8619 Personal history of other infectious and parasitic diseases: Secondary | ICD-10-CM | POA: Insufficient documentation

## 2014-04-26 LAB — URINALYSIS, ROUTINE W REFLEX MICROSCOPIC
Bilirubin Urine: NEGATIVE
GLUCOSE, UA: NEGATIVE mg/dL
Hgb urine dipstick: NEGATIVE
KETONES UR: NEGATIVE mg/dL
LEUKOCYTES UA: NEGATIVE
Nitrite: NEGATIVE
PH: 6 (ref 5.0–8.0)
Protein, ur: NEGATIVE mg/dL
Specific Gravity, Urine: 1.02 (ref 1.005–1.030)
Urobilinogen, UA: 0.2 mg/dL (ref 0.0–1.0)

## 2014-04-26 LAB — I-STAT CHEM 8, ED
BUN: 9 mg/dL (ref 6–23)
CHLORIDE: 103 mmol/L (ref 96–112)
CREATININE: 0.8 mg/dL (ref 0.50–1.10)
Calcium, Ion: 1.17 mmol/L (ref 1.12–1.23)
GLUCOSE: 90 mg/dL (ref 70–99)
HEMATOCRIT: 43 % (ref 36.0–46.0)
Hemoglobin: 14.6 g/dL (ref 12.0–15.0)
POTASSIUM: 3.7 mmol/L (ref 3.5–5.1)
Sodium: 141 mmol/L (ref 135–145)
TCO2: 21 mmol/L (ref 0–100)

## 2014-04-26 LAB — DIFFERENTIAL
Basophils Absolute: 0 10*3/uL (ref 0.0–0.1)
Basophils Relative: 0 % (ref 0–1)
EOS PCT: 1 % (ref 0–5)
Eosinophils Absolute: 0.1 10*3/uL (ref 0.0–0.7)
LYMPHS PCT: 27 % (ref 12–46)
Lymphs Abs: 1.4 10*3/uL (ref 0.7–4.0)
Monocytes Absolute: 0.4 10*3/uL (ref 0.1–1.0)
Monocytes Relative: 8 % (ref 3–12)
NEUTROS ABS: 3.4 10*3/uL (ref 1.7–7.7)
Neutrophils Relative %: 64 % (ref 43–77)

## 2014-04-26 LAB — RAPID URINE DRUG SCREEN, HOSP PERFORMED
Amphetamines: NOT DETECTED
Barbiturates: NOT DETECTED
Benzodiazepines: NOT DETECTED
COCAINE: NOT DETECTED
Opiates: NOT DETECTED
Tetrahydrocannabinol: POSITIVE — AB

## 2014-04-26 LAB — I-STAT TROPONIN, ED: Troponin i, poc: 0 ng/mL (ref 0.00–0.08)

## 2014-04-26 LAB — CBC
HCT: 40.2 % (ref 36.0–46.0)
Hemoglobin: 13.9 g/dL (ref 12.0–15.0)
MCH: 31.5 pg (ref 26.0–34.0)
MCHC: 34.6 g/dL (ref 30.0–36.0)
MCV: 91.2 fL (ref 78.0–100.0)
Platelets: 193 10*3/uL (ref 150–400)
RBC: 4.41 MIL/uL (ref 3.87–5.11)
RDW: 12.4 % (ref 11.5–15.5)
WBC: 5.4 10*3/uL (ref 4.0–10.5)

## 2014-04-26 LAB — COMPREHENSIVE METABOLIC PANEL
ALT: 15 U/L (ref 0–35)
AST: 19 U/L (ref 0–37)
Albumin: 4 g/dL (ref 3.5–5.2)
Alkaline Phosphatase: 48 U/L (ref 39–117)
Anion gap: 13 (ref 5–15)
BUN: 8 mg/dL (ref 6–23)
CALCIUM: 9 mg/dL (ref 8.4–10.5)
CHLORIDE: 102 mmol/L (ref 96–112)
CO2: 24 mmol/L (ref 19–32)
Creatinine, Ser: 0.77 mg/dL (ref 0.50–1.10)
GFR calc Af Amer: >90 mL/min (ref >90)
GFR calc non Af Amer: >90 mL/min (ref >90)
Glucose, Bld: 94 mg/dL (ref 70–99)
Potassium: 3.7 mmol/L (ref 3.5–5.1)
SODIUM: 139 mmol/L (ref 135–145)
Total Bilirubin: 0.8 mg/dL (ref 0.3–1.2)
Total Protein: 6.6 g/dL (ref 6.0–8.3)

## 2014-04-26 LAB — CBG MONITORING, ED: GLUCOSE-CAPILLARY: 91 mg/dL (ref 70–99)

## 2014-04-26 LAB — PROTIME-INR
INR: 1.07 (ref 0.00–1.49)
PROTHROMBIN TIME: 14.1 s (ref 11.6–15.2)

## 2014-04-26 LAB — ETHANOL: Alcohol, Ethyl (B): <5 mg/dL (ref 0–9)

## 2014-04-26 LAB — APTT: aPTT: 31 seconds (ref 24–37)

## 2014-04-26 MED ORDER — METHYLPREDNISOLONE SODIUM SUCC 125 MG IJ SOLR
125.0000 mg | Freq: Once | INTRAMUSCULAR | Status: AC
  Administered 2014-04-26: 125 mg via INTRAVENOUS
  Filled 2014-04-26: qty 2

## 2014-04-26 MED ORDER — SODIUM CHLORIDE 0.9 % IV SOLN
1000.0000 mL | INTRAVENOUS | Status: DC
  Administered 2014-04-26: 1000 mL via INTRAVENOUS

## 2014-04-26 MED ORDER — DIPHENHYDRAMINE HCL 50 MG/ML IJ SOLN
25.0000 mg | Freq: Once | INTRAMUSCULAR | Status: AC
  Administered 2014-04-26: 25 mg via INTRAVENOUS
  Filled 2014-04-26: qty 1

## 2014-04-26 MED ORDER — METOCLOPRAMIDE HCL 10 MG PO TABS: 10.0000 mg | ORAL_TABLET | Freq: Four times a day (QID) | ORAL | Status: DC | PRN

## 2014-04-26 MED ORDER — METOCLOPRAMIDE HCL 5 MG/ML IJ SOLN
10.0000 mg | Freq: Once | INTRAMUSCULAR | Status: AC
  Administered 2014-04-26: 10 mg via INTRAVENOUS
  Filled 2014-04-26: qty 2

## 2014-04-26 MED ORDER — KETOROLAC TROMETHAMINE 30 MG/ML IJ SOLN
30.0000 mg | Freq: Once | INTRAMUSCULAR | Status: AC
  Administered 2014-04-26: 30 mg via INTRAVENOUS
  Filled 2014-04-26: qty 1

## 2014-04-26 MED ORDER — SODIUM CHLORIDE 0.9 % IV SOLN
1000.0000 mL | Freq: Once | INTRAVENOUS | Status: AC
  Administered 2014-04-26: 1000 mL via INTRAVENOUS

## 2014-04-26 NOTE — Progress Notes (Signed)
Code stroke called on 24 y.o female. LSN determined to be 1600 as patient developed left arm numbness, yet she began having migraine pain at 1500. Pt tried a Goodys OTC powder with no relief. Upon arrival to ED Pt taken to CT scan. CT scan negative for acute changes. NIHSS completed and yielded 1 for diminished sensory in Pt's left arm. CBG 91. Pt pertinent history includes Migraines (per Pt she has not has one for five years when she was pregnant), Asthma. Pain medication to be ordered for migraine and MRI ordered as well. Per neurologist symptoms likely represent a complicated migraine. No stroke work up unless MRI is positive for acute neurological event.

## 2014-04-26 NOTE — Discharge Instructions (Signed)
Migraine Headache A migraine headache is an intense, throbbing pain on one or both sides of your head. A migraine can last for 30 minutes to several hours. CAUSES  The exact cause of a migraine headache is not always known. However, a migraine may be caused when nerves in the brain become irritated and release chemicals that cause inflammation. This causes pain. Certain things may also trigger migraines, such as:  Alcohol.  Smoking.  Stress.  Menstruation.  Aged cheeses.  Foods or drinks that contain nitrates, glutamate, aspartame, or tyramine.  Lack of sleep.  Chocolate.  Caffeine.  Hunger.  Physical exertion.  Fatigue.  Medicines used to treat chest pain (nitroglycerine), birth control pills, estrogen, and some blood pressure medicines. SIGNS AND SYMPTOMS  Pain on one or both sides of your head.  Pulsating or throbbing pain.  Severe pain that prevents daily activities.  Pain that is aggravated by any physical activity.  Nausea, vomiting, or both.  Dizziness.  Pain with exposure to bright lights, loud noises, or activity.  General sensitivity to bright lights, loud noises, or smells. Before you get a migraine, you may get warning signs that a migraine is coming (aura). An aura may include:  Seeing flashing lights.  Seeing bright spots, halos, or zigzag lines.  Having tunnel vision or blurred vision.  Having feelings of numbness or tingling.  Having trouble talking.  Having muscle weakness. DIAGNOSIS  A migraine headache is often diagnosed based on:  Symptoms.  Physical exam.  A CT scan or MRI of your head. These imaging tests cannot diagnose migraines, but they can help rule out other causes of headaches. TREATMENT Medicines may be given for pain and nausea. Medicines can also be given to help prevent recurrent migraines.  HOME CARE INSTRUCTIONS  Only take over-the-counter or prescription medicines for pain or discomfort as directed by your  health care provider. The use of long-term narcotics is not recommended.  Lie down in a dark, quiet room when you have a migraine.  Keep a journal to find out what may trigger your migraine headaches. For example, write down:  What you eat and drink.  How much sleep you get.  Any change to your diet or medicines.  Limit alcohol consumption.  Quit smoking if you smoke.  Get 7-9 hours of sleep, or as recommended by your health care provider.  Limit stress.  Keep lights dim if bright lights bother you and make your migraines worse. SEEK IMMEDIATE MEDICAL CARE IF:   Your migraine becomes severe.  You have a fever.  You have a stiff neck.  You have vision loss.  You have muscular weakness or loss of muscle control.  You start losing your balance or have trouble walking.  You feel faint or pass out.  You have severe symptoms that are different from your first symptoms. MAKE SURE YOU:   Understand these instructions.  Will watch your condition.  Will get help right away if you are not doing well or get worse. Document Released: 12/24/2004 Document Revised: 05/10/2013 Document Reviewed: 08/31/2012 [facility] Patient Information 2015 [place], [place]. This information is not intended to replace advice given to you by your health care provider. Make sure you discuss any questions you have with your health care provider.  Metoclopramide tablets What is this medicine? METOCLOPRAMIDE (met oh kloe PRA mide) is used to treat the symptoms of gastroesophageal reflux disease (GERD) like heartburn. It is also used to treat people with slow emptying of the stomach and  intestinal tract. This medicine may be used for other purposes; ask your health care provider or pharmacist if you have questions. COMMON BRAND NAME(S): Reglan What should I tell my health care provider before I take this medicine? They need to know if you have any of these conditions: -breast  cancer -depression -diabetes -heart failure -high blood pressure -kidney disease -liver disease -Parkinson's disease or a movement disorder -pheochromocytoma -seizures -stomach obstruction, bleeding, or perforation -an unusual or allergic reaction to metoclopramide, procainamide, sulfites, other medicines, foods, dyes, or preservatives -pregnant or trying to get pregnant -breast-feeding How should I use this medicine? Take this medicine by mouth with a glass of water. Follow the directions on the prescription label. Take this medicine on an empty stomach, about 30 minutes before eating. Take your doses at regular intervals. Do not take your medicine more often than directed. Do not stop taking except on the advice of your doctor or health care professional. A special MedGuide will be given to you by the pharmacist with each prescription and refill. Be sure to read this information carefully each time. Talk to your pediatrician regarding the use of this medicine in children. Special care may be needed. Overdosage: If you think you have taken too much of this medicine contact a poison control center or emergency room at once. NOTE: This medicine is only for you. Do not share this medicine with others. What if I miss a dose? If you miss a dose, take it as soon as you can. If it is almost time for your next dose, take only that dose. Do not take double or extra doses. What may interact with this medicine? -acetaminophen -cyclosporine -digoxin -medicines for blood pressure -medicines for diabetes, including insulin -medicines for hay fever and other allergies -medicines for depression, especially an Monoamine Oxidase Inhibitor (MAOI) -medicines for Parkinson's disease, like levodopa -medicines for sleep or for pain -tetracycline This list may not describe all possible interactions. Give your health care provider a list of all the medicines, herbs, non-prescription drugs, or dietary  supplements you use. Also tell them if you smoke, drink alcohol, or use illegal drugs. Some items may interact with your medicine. What should I watch for while using this medicine? It may take a few weeks for your stomach condition to start to get better. However, do not take this medicine for longer than 12 weeks. The longer you take this medicine, and the more you take it, the greater your chances are of developing serious side effects. If you are an elderly patient, a female patient, or you have diabetes, you may be at an increased risk for side effects from this medicine. Contact your doctor immediately if you start having movements you cannot control such as lip smacking, rapid movements of the tongue, involuntary or uncontrollable movements of the eyes, head, arms and legs, or muscle twitches and spasms. Patients and their families should watch out for worsening depression or thoughts of suicide. Also watch out for any sudden or severe changes in feelings such as feeling anxious, agitated, panicky, irritable, hostile, aggressive, impulsive, severely restless, overly excited and hyperactive, or not being able to sleep. If this happens, especially at the beginning of treatment or after a change in dose, call your doctor. Do not treat yourself for high fever. Ask your doctor or health care professional for advice. You may get drowsy or dizzy. Do not drive, use machinery, or do anything that needs mental alertness until you know how this drug affects you.  Do not stand or sit up quickly, especially if you are an older patient. This reduces the risk of dizzy or fainting spells. Alcohol can make you more drowsy and dizzy. Avoid alcoholic drinks. What side effects may I notice from receiving this medicine? Side effects that you should report to your doctor or health care professional as soon as possible: -allergic reactions like skin rash, itching or hives, swelling of the face, lips, or tongue -abnormal  production of milk in females -breast enlargement in both males and females -change in the way you walk -difficulty moving, speaking or swallowing -drooling, lip smacking, or rapid movements of the tongue -excessive sweating -fever -involuntary or uncontrollable movements of the eyes, head, arms and legs -irregular heartbeat or palpitations -muscle twitches and spasms -unusually weak or tired Side effects that usually do not require medical attention (report to your doctor or health care professional if they continue or are bothersome): -change in sex drive or performance -depressed mood -diarrhea -difficulty sleeping -headache -menstrual changes -restless or nervous This list may not describe all possible side effects. Call your doctor for medical advice about side effects. You may report side effects to FDA at 1-800-FDA-1088. Where should I keep my medicine? Keep out of the reach of children. Store at room temperature between 20 and 25 degrees C (68 and 77 degrees F). Protect from light. Keep container tightly closed. Throw away any unused medicine after the expiration date. NOTE: This sheet is a summary. It may not cover all possible information. If you have questions about this medicine, talk to your doctor, pharmacist, or health care provider.  2015, [employer]. (2011-04-23 13:04:38)

## 2014-04-26 NOTE — ED Provider Notes (Signed)
CSN: [id number]     Arrival date & time 04/26/14  1902 History   First MD Initiated Contact with Patient 04/26/14 1935     Chief Complaint  Patient presents with  . Migraine  . Numbness    An emergency department physician performed an initial assessment on this suspected stroke patient at 33. (Consider location/radiation/quality/duration/timing/severity/associated sxs/prior Treatment) Patient is a 24 y.o. female presenting with migraines. The history is provided by the patient.  Migraine  She had onset about 3 PM of a right hemicranial throbbing headache typical of her migraines. This was followed by an episode of her vision starting to break and then her left arm became numb and she wasn't able to use it. She rates headache at 8/10. Is associated nausea but no vomiting. There is some photophobia. She has had neurologic symptoms with migraines in the past. She's not received any treatment at this point. Because of neurologic deficits, code stroke was called at triage. At this point, she has been seen by Dr. Thad Ranger of neurology who feels that symptoms are all related to migraine.  Past Medical History  Diagnosis Date  . Asthma   . Migraine   . Hx of chlamydia infection    Past Surgical History  Procedure Laterality Date  . Eye surgery    . Tympanostomy tube placement    . Lymph node biopsy      Left neck after cat scratch fever  . Laparoscopic appendectomy  02/03/2012    Procedure: APPENDECTOMY LAPAROSCOPIC;  Surgeon: Wilmon Arms. Corliss Skains, MD;  Location: [facility] OR;  Service: General;  Laterality: N/A;  . Appendectomy     No family history on file. History  Substance Use Topics  . Smoking status: Current Every Day Smoker -- 0.50 packs/day    Types: Cigarettes  . Smokeless tobacco: Not on file  . Alcohol Use: Yes   OB History    Gravida Para Term Preterm AB TAB SAB Ectopic Multiple Living   Review of Systems  All other systems reviewed and are  negative.     Allergies  Penicillins  Home Medications   Prior to Admission medications   Medication Sig Start Date End Date Taking? Authorizing Provider  MICROGESTIN FE 1/20 1-20 MG-MCG tablet Take 1 tablet by mouth daily. 04/07/14  Yes Historical Provider, MD  acetaminophen (TYLENOL) 500 MG tablet Take 1,000 mg by mouth every 6 (six) hours as needed for moderate pain (pain).    Historical Provider, MD  ciprofloxacin (CIPRO) 500 MG tablet Take one po BID x 7 days Patient not taking: Reported on 02/16/2014 04/23/13   Amy S Esterwood, PA-C  clidinium-chlordiazePOXIDE (LIBRAX) 5-2.5 MG per capsule Take 1 capsule by mouth 3 (three) times daily before meals. Patient not taking: Reported on 02/16/2014 04/20/13   Amy S Esterwood, PA-C  HYDROcodone-acetaminophen (NORCO/VICODIN) 5-325 MG per tablet Take 2 tablets by mouth every 4 (four) hours as needed for moderate pain or severe pain. Patient not taking: Reported on 04/26/2014 02/16/14   Emilia Beck, PA-C  ibuprofen (ADVIL,MOTRIN) 200 MG tablet Take 400-600 mg by mouth every 6 (six) hours as needed for moderate pain (pain).    Historical Provider, MD  levofloxacin (LEVAQUIN) 500 MG tablet Take 1 tablet (500 mg total) by mouth daily. Patient not taking: Reported on 02/16/2014 09/11/13   Linna Hoff, MD  omeprazole (PRILOSEC) 20 MG capsule Take one capsule PO twice a day for  3 days, then one capsule PO once a day. Patient not taking: Reported on 04/26/2014 04/15/13   Renne Crigler, PA-C  ondansetron (ZOFRAN ODT) 4 MG disintegrating tablet Take 1 tablet (4 mg total) by mouth every 8 (eight) hours as needed for nausea or vomiting. Patient not taking: Reported on 04/26/2014 02/16/14   Emilia Beck, PA-C  promethazine (PHENERGAN) 25 MG tablet Take 1 tablet (25 mg total) by mouth every 6 (six) hours as needed for nausea or vomiting. Patient not taking: Reported on 02/16/2014 04/20/13   Amy S Esterwood, PA-C  sucralfate (CARAFATE) 1 G tablet Take 1 tablet  (1 g total) by mouth 4 (four) times daily -  with meals and at bedtime. Patient not taking: Reported on 02/16/2014 04/15/13   Renne Crigler, PA-C  sulfamethoxazole-trimethoprim (SEPTRA DS) 800-160 MG per tablet Take 1 tablet by mouth every 12 (twelve) hours. Patient not taking: Reported on 04/26/2014 02/16/14   Kaitlyn Szekalski, PA-C   BP 115/90 mmHg  Pulse 60  Temp(Src) 97.8 F (36.6 C) (Oral)  Resp 19  Ht  (1.727 m)  Wt 202 lb (91.627 kg)  BMI 30.72 kg/m2  SpO2 100% Physical Exam  Nursing note and vitals reviewed.  24 year old female, resting comfortably and in no acute distress. Vital signs are normal. Oxygen saturation is 100%, which is normal. Head is normocephalic and atraumatic. PERRLA, EOMI. Oropharynx is clear. Neck is nontender and supple without adenopathy or JVD. Back is nontender and there is no CVA tenderness. Lungs are clear without rales, wheezes, or rhonchi. Chest is nontender. Heart has regular rate and rhythm without murmur. Abdomen is soft, flat, nontender without masses or hepatosplenomegaly and peristalsis is normoactive. Extremities have no cyanosis or edema, full range of motion is present. Skin is warm and dry without rash. Neurologic: Mental status is normal, cranial nerves are intact. There is decreased sensation in the left arm and there is mild weakness of left arm and left leg-4/10.  ED Course  Procedures (including critical care time) Labs Review Results for orders placed or performed during the hospital encounter of 04/26/14  Ethanol  Result Value Ref Range   Alcohol, Ethyl (B) <5 0 - 9 mg/dL  Protime-INR  Result Value Ref Range   Prothrombin Time 14.1 11.6 - 15.2 seconds   INR 1.07 0.00 - 1.49  APTT  Result Value Ref Range   aPTT 31 24 - 37 seconds  CBC  Result Value Ref Range   WBC 5.4 4.0 - 10.5 K/uL   RBC 4.41 3.87 - 5.11 MIL/uL   Hemoglobin 13.9 12.0 - 15.0 g/dL   HCT 16.1 09.6 - 04.5 %   MCV 91.2 78.0 - 100.0 fL   MCH 31.5 26.0  - 34.0 pg   MCHC 34.6 30.0 - 36.0 g/dL   RDW 40.9 81.1 - 91.4 %   Platelets 193 150 - 400 K/uL  Differential  Result Value Ref Range   Neutrophils Relative % 64 43 - 77 %   Neutro Abs 3.4 1.7 - 7.7 K/uL   Lymphocytes Relative 27 12 - 46 %   Lymphs Abs 1.4 0.7 - 4.0 K/uL   Monocytes Relative 8 3 - 12 %   Monocytes Absolute 0.4 0.1 - 1.0 K/uL   Eosinophils Relative 1 0 - 5 %   Eosinophils Absolute 0.1 0.0 - 0.7 K/uL   Basophils Relative 0 0 - 1 %   Basophils Absolute 0.0 0.0 - 0.1 K/uL  Comprehensive metabolic panel  Result  Value Ref Range   Sodium 139 135 - 145 mmol/L   Potassium 3.7 3.5 - 5.1 mmol/L   Chloride 102 96 - 112 mmol/L   CO2 24 19 - 32 mmol/L   Glucose, Bld 94 70 - 99 mg/dL   BUN 8 6 - 23 mg/dL   Creatinine, Ser 1.610.77 0.50 - 1.10 mg/dL   Calcium 9.0 8.4 - 09.610.5 mg/dL   Total Protein 6.6 6.0 - 8.3 g/dL   Albumin 4.0 3.5 - 5.2 g/dL   AST 19 0 - 37 U/L   ALT 15 0 - 35 U/L   Alkaline Phosphatase 48 39 - 117 U/L   Total Bilirubin 0.8 0.3 - 1.2 mg/dL   GFR calc non Af Amer >90 >90 mL/min   GFR calc Af Amer >90 >90 mL/min   Anion gap 13 5 - 15  I-Stat Chem 8, ED  Result Value Ref Range   Sodium 141 135 - 145 mmol/L   Potassium 3.7 3.5 - 5.1 mmol/L   Chloride 103 96 - 112 mmol/L   BUN 9 6 - 23 mg/dL   Creatinine, Ser 0.450.80 0.50 - 1.10 mg/dL   Glucose, Bld 90 70 - 99 mg/dL   Calcium, Ion 4.091.17 8.111.12 - 1.23 mmol/L   TCO2 21 0 - 100 mmol/L   Hemoglobin 14.6 12.0 - 15.0 g/dL   HCT 91.443.0 78.236.0 - 95.646.0 %  I-Stat Troponin, ED ([facility])  Result Value Ref Range   Troponin i, poc 0.00 0.00 - 0.08 ng/mL   Comment 3          CBG monitoring, ED  Result Value Ref Range   Glucose-Capillary 91 70 - 99 mg/dL    Imaging Review Ct Head Wo Contrast  04/26/2014   CLINICAL DATA:  Code stroke. Headache this afternoon followed by a left-sided weakness.  EXAM: CT HEAD WITHOUT CONTRAST  TECHNIQUE: Contiguous axial images were obtained from the base of the skull through the vertex  without intravenous contrast.  COMPARISON:  01/24/2008  FINDINGS: The brain has a normal appearance without evidence of atrophy, infarction, mass lesion, hemorrhage, hydrocephalus or extra-axial collection. The calvarium is unremarkable. The paranasal sinuses, middle ears and mastoids are clear.  IMPRESSION: Normal.  Aspects score 10  These results were called by telephone at the time of interpretation on 04/26/2014 at 7:37 pm to Dr. Thad Rangereynolds, who verbally acknowledged these results.   Electronically Signed   By: Paulina FusiMark  Shogry M.D.   On: 04/26/2014 19:39   Images viewed by me.   EKG Interpretation   Date/Time:  Tuesday April 26 2014 19:39:57 EDT Ventricular Rate:  62 PR Interval:  146 QRS Duration: 88 QT Interval:  419 QTC Calculation: 425 R Axis:   76 Text Interpretation:  Sinus rhythm Baseline wander in lead(s) V2 Otherwise  within normal limits No old tracing to compare Confirmed by [facility]  MD,  Katieann Hungate ([id number]) on 04/26/2014 7:48:23 PM      MDM   Final diagnoses:  Intractable hemiplegic migraine without status migrainosus    Migraine headache with secondary neurologic deficits. Head CT is unremarkable. She will be given a migraine cocktail of IV fluids, metoclopramide, diphenhydramine, ketorolac, and methylprednisolone.  Following above-noted treatment, headache is completely gone and numbness and weakness is completely resolved. She has gone for an MRI scan which is normal per my evaluation, awaiting official report. In the meantime, patient is discharged with prescription for metoclopramide.  Dione Boozeavid Avangelina Flight, MD 05/01/14 [phone]

## 2014-04-26 NOTE — Consult Note (Signed)
Referring Physician: Preston FleetingGlick    Chief Complaint: Left arm numbness, headache  HPI: Rose [name] is an 24 y.o. female who reports that she was at work today and began to have a migraine at about 3p.  At 4p she noted left arm numbness and was unable to pick up anything with her left hand.  She tried a [employer] without relief and presented for evaluation.  Patient reports headache is throbbing.  She is nauseous and has some photophobia as well.  She rates her headache at a 7/10.  She has had migraines in the past.  With her migraines 5 years ago when she was pregnant she had left sided symptoms.  She was told at that time that the left sided symptoms were because the baby was lying on a nerve.  She has had migraines since the pregnancy that were relieved with OTC medications and not associated with left sided symptoms.   Code stroke called on presentation.  Initial NIHSS of 1.    Date last known well: Date: 04/26/2014 Time last known well: Time: 16:00 tPA Given: No: Minimal symptoms  Past Medical History  Diagnosis Date  . Asthma   . Migraine   . Hx of chlamydia infection     Past Surgical History  Procedure Laterality Date  . Eye surgery    . Tympanostomy tube placement    . Lymph node biopsy      Left neck after cat scratch fever  . Laparoscopic appendectomy  02/03/2012    Procedure: APPENDECTOMY LAPAROSCOPIC;  Surgeon: Wilmon ArmsMatthew K. Corliss Skainssuei, MD;  Location: [facility] OR;  Service: General;  Laterality: N/A;  . Appendectomy      Family history:  Both parents alive and well with no medical problems.  Maternal grandmother with lung cancer.     Social History:  reports that she has been smoking Cigarettes.  She has been smoking about 0.50 packs per day. She does not have any smokeless tobacco history on file. She reports that she drinks alcohol. She reports that she does not use illicit drugs.  Allergies:  Allergies  Allergen Reactions  . Penicillins Hives    Medications: I have reviewed  the patient's current medications. Prior to Admission:  Current outpatient prescriptions:  .  acetaminophen (TYLENOL) 500 MG tablet, Take 1,000 mg by mouth every 6 (six) hours as needed for moderate pain (pain)., Disp: , Rfl:  .  Aspirin-Acetaminophen-Caffeine (GOODY HEADACHE PO), Take 1 packet by mouth daily as needed (headache)., Disp: , Rfl:  .  ibuprofen (ADVIL,MOTRIN) 200 MG tablet, Take 400-600 mg by mouth every 6 (six) hours as needed for moderate pain (pain)., Disp: , Rfl:  .  MICROGESTIN FE 1/20 1-20 MG-MCG tablet, Take 1 tablet by mouth daily., Disp: , Rfl: 4 .  ciprofloxacin (CIPRO) 500 MG tablet, Take one po BID x 7 days (Patient not taking: Reported on 02/16/2014), Disp: 14 tablet, Rfl: 0 .  clidinium-chlordiazePOXIDE (LIBRAX) 5-2.5 MG per capsule, Take 1 capsule by mouth 3 (three) times daily before meals. (Patient not taking: Reported on 02/16/2014), Disp: 60 capsule, Rfl: 3 .  HYDROcodone-acetaminophen (NORCO/VICODIN) 5-325 MG per tablet, Take 2 tablets by mouth every 4 (four) hours as needed for moderate pain or severe pain. (Patient not taking: Reported on 04/26/2014), Disp: 12 tablet, Rfl: 0 .  levofloxacin (LEVAQUIN) 500 MG tablet, Take 1 tablet (500 mg total) by mouth daily. (Patient not taking: Reported on 02/16/2014), Disp: 7 tablet, Rfl: 0 .  omeprazole (PRILOSEC) 20 MG capsule,  Take one capsule PO twice a day for 3 days, then one capsule PO once a day. (Patient not taking: Reported on 04/26/2014), Disp: 20 capsule, Rfl: 0 .  ondansetron (ZOFRAN ODT) 4 MG disintegrating tablet, Take 1 tablet (4 mg total) by mouth every 8 (eight) hours as needed for nausea or vomiting. (Patient not taking: Reported on 04/26/2014), Disp: 10 tablet, Rfl: 0 .  promethazine (PHENERGAN) 25 MG tablet, Take 1 tablet (25 mg total) by mouth every 6 (six) hours as needed for nausea or vomiting. (Patient not taking: Reported on 02/16/2014), Disp: 40 tablet, Rfl: 1 .  sucralfate (CARAFATE) 1 G tablet, Take 1 tablet  (1 g total) by mouth 4 (four) times daily -  with meals and at bedtime. (Patient not taking: Reported on 02/16/2014), Disp: 60 tablet, Rfl: 0 .  sulfamethoxazole-trimethoprim (SEPTRA DS) 800-160 MG per tablet, Take 1 tablet by mouth every 12 (twelve) hours. (Patient not taking: Reported on 04/26/2014), Disp: 14 tablet, Rfl: 0  ROS: History obtained from the patient  General ROS: negative for - chills, fatigue, fever, night sweats, weight gain or weight loss Psychological ROS: negative for - behavioral disorder, hallucinations, memory difficulties, mood swings or suicidal ideation Ophthalmic ROS: blurry vision ENT ROS: negative for - epistaxis, nasal discharge, oral lesions, sore throat, tinnitus or vertigo Allergy and Immunology ROS: negative for - hives or itchy/watery eyes Hematological and Lymphatic ROS: negative for - bleeding problems, bruising or swollen lymph nodes Endocrine ROS: negative for - galactorrhea, hair pattern changes, polydipsia/polyuria or temperature intolerance Respiratory ROS: negative for - cough, hemoptysis, shortness of breath or wheezing Cardiovascular ROS: negative for - chest pain, dyspnea on exertion, edema or irregular heartbeat Gastrointestinal ROS: negative for - abdominal pain, diarrhea, hematemesis, nausea/vomiting or stool incontinence Genito-Urinary ROS: negative for - dysuria, hematuria, incontinence or urinary frequency/urgency Musculoskeletal ROS: negative for - joint swelling or muscular weakness Neurological ROS: as noted in HPI Dermatological ROS: negative for rash and skin lesion changes  Physical Examination: Blood pressure 131/75, pulse 60, temperature 97.8 F (36.6 C), temperature source Oral, resp. rate 28, height 5\' 8"  (1.727 m), weight 91.627 kg (202 lb), SpO2 98 %.  HEENT-  Normocephalic, no lesions, without obvious abnormality.  Normal external eye and conjunctiva.  Normal TM's bilaterally.  Normal auditory canals and external ears. Normal  external nose, mucus membranes and septum.  Normal pharynx. Cardiovascular- S1, S2 normal, pulses palpable throughout   Lungs- chest clear, no wheezing, rales, normal symmetric air entry Abdomen- soft, non-tender; bowel sounds normal; no masses,  no organomegaly Extremities- no edema Lymph-no adenopathy palpable Musculoskeletal-no joint tenderness, deformity or swelling Skin-warm and dry, no hyperpigmentation, vitiligo, or suspicious lesions  Neurological Examination Mental Status: Alert, oriented, thought content appropriate.  Speech fluent without evidence of aphasia.  Able to follow 3 step commands without difficulty. Cranial Nerves: II: Discs flat bilaterally; Visual fields grossly normal, pupils equal, round, reactive to light and accommodation III,IV, VI: ptosis not present, extra-ocular motions intact bilaterally V,VII: smile symmetric, facial light touch sensation normal bilaterally VIII: hearing normal bilaterally IX,X: gag reflex present XI: bilateral shoulder shrug XII: midline tongue extension Motor: Right : Upper extremity   5/5    Left:     Upper extremity   5/5  Lower extremity   5/5     Lower extremity   5/5 Tone and bulk:normal tone throughout; no atrophy noted Sensory: Pinprick and light touch absent on the LUE Deep Tendon Reflexes: 2+ and symmetric throughout Plantars: Right: downgoing  Left: downgoing Cerebellar: normal finger-to-nose, normal rapid alternating movements and normal heel-to-shin test Gait: normal gait and station      Laboratory Studies:  Basic Metabolic Panel:  Recent Labs Lab 04/26/14 1925  NA 141  K 3.7  CL 103  GLUCOSE 90  BUN 9  CREATININE 0.80    Liver Function Tests: No results for input(s): AST, ALT, ALKPHOS, BILITOT, PROT, ALBUMIN in the last 168 hours. No results for input(s): LIPASE, AMYLASE in the last 168 hours. No results for input(s): AMMONIA in the last 168 hours.  CBC:  Recent Labs Lab 04/26/14 1918  04/26/14 1925  WBC 5.4  --   NEUTROABS 3.4  --   HGB 13.9 14.6  HCT 40.2 43.0  MCV 91.2  --   PLT 193  --     Cardiac Enzymes: No results for input(s): CKTOTAL, CKMB, CKMBINDEX, TROPONINI in the last 168 hours.  BNP: Invalid input(s): POCBNP  CBG:  Recent Labs Lab 04/26/14 1932  GLUCAP 91    Microbiology: Results for orders placed or performed during the hospital encounter of 07/24/12  Urine culture     Status: None   Collection Time: 07/24/12  4:35 PM  Result Value Ref Range Status   Specimen Description URINE, CLEAN CATCH  Final   Special Requests NONE  Final   Culture  Setup Time 07/25/2012 00:42  Final   Colony Count 40,000 COLONIES/ML  Final   Culture   Final    Multiple bacterial morphotypes present, none predominant. Suggest appropriate recollection if clinically indicated.   Report Status 07/26/2012 FINAL  Final    Coagulation Studies:  Recent Labs  04/26/14 1918  LABPROT 14.1  INR 1.07    Urinalysis: No results for input(s): COLORURINE, LABSPEC, PHURINE, GLUCOSEU, HGBUR, BILIRUBINUR, KETONESUR, PROTEINUR, UROBILINOGEN, NITRITE, LEUKOCYTESUR in the last 168 hours.  Invalid input(s): APPERANCEUR  Lipid Panel:    Component Value Date/Time   CHOL 128 05/22/2007 2049    HgbA1C: No results found for: HGBA1C  Urine Drug Screen:  No results found for: LABOPIA, COCAINSCRNUR, LABBENZ, AMPHETMU, THCU, LABBARB  Alcohol Level: No results for input(s): ETH in the last 168 hours.  Other results: EKG: sinus rhythm at 62 bpm.  Imaging: Ct Head Wo Contrast  04/26/2014   CLINICAL DATA:  Code stroke. Headache this afternoon followed by a left-sided weakness.  EXAM: CT HEAD WITHOUT CONTRAST  TECHNIQUE: Contiguous axial images were obtained from the base of the skull through the vertex without intravenous contrast.  COMPARISON:  01/24/2008  FINDINGS: The brain has a normal appearance without evidence of atrophy, infarction, mass lesion, hemorrhage, hydrocephalus  or extra-axial collection. The calvarium is unremarkable. The paranasal sinuses, middle ears and mastoids are clear.  IMPRESSION: Normal.  Aspects score 10  These results were called by telephone at the time of interpretation on 04/26/2014 at 7:37 pm to Dr. Thad Ranger, who verbally acknowledged these results.   Electronically Signed   By: Paulina Fusi M.D.   On: 04/26/2014 19:39    Assessment: 24 y.o. female presenting with migraine and left upper extremity numbness.  Patient has had similar symptoms in the past but not in the past 5 years.  Headache not relieved with OTC medications.  Head CT reviewed and shows no acute changes.  Symptoms likely represent a complicated migraine.  Doubt CVA but will investigate further.    Stroke Risk Factors - smoking  Plan: 1. Analgesics for headache 2. MRI of the brain without contrast.  Would not recommend  further stroke work up unless imaging is diagnostic of an acute ischemic event.   3. Telemetry monitoring 4. Frequent neuro checks  Case discussed with Dr.Glick  Thana Farr, MD [facility] [phone] 04/26/2014, 8:01 PM

## 2014-04-26 NOTE — ED Notes (Signed)
Pt states that she has had a migraine since 3pm and left are numbness and weakness since 4pm. Pt also reports blurred vision. Pt states that she has had numbness in her arms before but it was when she was pregnant.

## 2014-04-27 NOTE — ED Notes (Signed)
Discharge instructions reviewed, voiced understanding.  

## 2014-05-31 ENCOUNTER — Emergency Department (HOSPITAL_COMMUNITY)
Admission: EM | Admit: 2014-05-31 | Discharge: 2014-05-31 | Disposition: A | Discharge status: Home / Self Care | Payer: PRIVATE HEALTH INSURANCE | Attending: Emergency Medicine | Admitting: Emergency Medicine

## 2014-05-31 ENCOUNTER — Encounter (HOSPITAL_COMMUNITY): Payer: Self-pay | Admitting: Emergency Medicine

## 2014-05-31 DIAGNOSIS — Z3202 Encounter for pregnancy test, result negative: Secondary | ICD-10-CM | POA: Diagnosis not present

## 2014-05-31 DIAGNOSIS — Z8619 Personal history of other infectious and parasitic diseases: Secondary | ICD-10-CM | POA: Insufficient documentation

## 2014-05-31 DIAGNOSIS — J45909 Unspecified asthma, uncomplicated: Secondary | ICD-10-CM | POA: Diagnosis not present

## 2014-05-31 DIAGNOSIS — Z793 Long term (current) use of hormonal contraceptives: Secondary | ICD-10-CM | POA: Diagnosis not present

## 2014-05-31 DIAGNOSIS — G4489 Other headache syndrome: Secondary | ICD-10-CM | POA: Diagnosis not present

## 2014-05-31 DIAGNOSIS — Z720 Tobacco use: Secondary | ICD-10-CM | POA: Insufficient documentation

## 2014-05-31 DIAGNOSIS — Z880 Allergy status to penicillin: Secondary | ICD-10-CM | POA: Diagnosis not present

## 2014-05-31 DIAGNOSIS — Z79899 Other long term (current) drug therapy: Secondary | ICD-10-CM | POA: Diagnosis not present

## 2014-05-31 DIAGNOSIS — R51 Headache: Secondary | ICD-10-CM | POA: Diagnosis present

## 2014-05-31 LAB — PREGNANCY, URINE: PREG TEST UR: NEGATIVE

## 2014-05-31 MED ORDER — MORPHINE SULFATE 4 MG/ML IJ SOLN
4.0000 mg | Freq: Once | INTRAMUSCULAR | Status: AC
  Administered 2014-05-31: 4 mg via INTRAMUSCULAR
  Filled 2014-05-31: qty 1

## 2014-05-31 MED ORDER — METOCLOPRAMIDE HCL 5 MG/ML IJ SOLN
10.0000 mg | Freq: Once | INTRAMUSCULAR | Status: AC
  Administered 2014-05-31: 10 mg via INTRAMUSCULAR
  Filled 2014-05-31: qty 2

## 2014-05-31 MED ORDER — DIPHENHYDRAMINE HCL 50 MG/ML IJ SOLN
25.0000 mg | Freq: Once | INTRAMUSCULAR | Status: AC
  Administered 2014-05-31: 25 mg via INTRAMUSCULAR
  Filled 2014-05-31: qty 1

## 2014-05-31 MED ORDER — BUTALBITAL-APAP-CAFFEINE 50-325-40 MG PO TABS: 1.0000 | ORAL_TABLET | Freq: Four times a day (QID) | ORAL | Status: DC | PRN

## 2014-05-31 MED ORDER — KETOROLAC TROMETHAMINE 60 MG/2ML IM SOLN
60.0000 mg | Freq: Once | INTRAMUSCULAR | Status: AC
  Administered 2014-05-31: 60 mg via INTRAMUSCULAR
  Filled 2014-05-31: qty 2

## 2014-05-31 NOTE — Progress Notes (Addendum)
  [facility] ED CM spoke with pt on how to obtain an in network pcp with insurance coverage via the customer service number or web site  Cm reviewed ED level of care for crisis/emergent services and community pcp level of care to manage continuous or chronic medical concerns.  The pt voiced understanding CM encouraged pt and discussed pt's responsibility to verify with pt's insurance carrier that any recommended medical provider offered by any emergency room or a hospital provider is within the carrier's network. The pt voiced understanding   1329 went back to pt room after ED RN left. RN in completing a procedure when CM saw her about pcp services Upon return noted pt tearful. Pt stated she was ok and did not need anything Cm asked permission to give pt a hug, offered her a box of kleenex and made sure she knew if she needed anything CM or RN can be called and will come back to see her

## 2014-05-31 NOTE — Discharge Instructions (Signed)

## 2014-05-31 NOTE — ED Notes (Addendum)
Pt c/o headache on r/side of head, blurred vision, numbness in l/arm, weakness on l/side. Pt is currently alert, oriented and appropriate. Ambulated to treatment room  without difficulty. C/o nausea, vomited x 1 . Treated with 3 Advil at 1030." Mother drove pt to ED, and she will return after discharge" PERL at 2. Grip equal both hands. No decreased ROM noted. Pt also noted seeing "black dots" followed by blurred vision

## 2014-05-31 NOTE — ED Provider Notes (Signed)
CSN: [id number]     Arrival date & time 05/31/14  1218 History   First MD Initiated Contact with Patient 05/31/14 1251     Chief Complaint  Patient presents with  . Blurred Vision    unable to drive  . Headache    pain on r/side of head  . Numbness    l/arm numbness x 2 hours     (Consider location/radiation/quality/duration/timing/severity/associated sxs/prior Treatment) HPI Comments: H/o complex migraine recently and this is similar to her episode in April 2016. Review of the old records show a negative mri and a neurohospitalist consult suggesting complex migraines as a cause of her sx  Patient is a 24 y.o. female presenting with headaches. The history is provided by the patient.  Headache Pain location:  R parietal Quality:  Sharp Radiates to:  Does not radiate Onset quality:  Sudden Duration:  2 hours Timing:  Constant Progression:  Unchanged Chronicity:  Recurrent Similar to prior headaches: yes   Context: bright light and loud noise   Relieved by:  NSAIDs Worsened by:  Light Ineffective treatments:  Aspirin Associated symptoms: no eye pain, no focal weakness, no myalgias, no neck pain, no neck stiffness, no paresthesias and no seizures     Past Medical History  Diagnosis Date  . Asthma   . Migraine   . Hx of chlamydia infection    Past Surgical History  Procedure Laterality Date  . Eye surgery    . Tympanostomy tube placement    . Lymph node biopsy      Left neck after cat scratch fever  . Laparoscopic appendectomy  02/03/2012    Procedure: APPENDECTOMY LAPAROSCOPIC;  Surgeon: Wilmon ArmsMatthew K. Corliss Skainssuei, MD;  Location: [facility] OR;  Service: General;  Laterality: N/A;  . Appendectomy     Family History  Problem Relation Age of Onset  . Cancer Other    History  Substance Use Topics  . Smoking status: Current Every Day Smoker -- 0.50 packs/day    Types: Cigarettes  . Smokeless tobacco: Not on file  . Alcohol Use: Yes   OB History    Gravida Para Term Preterm AB TAB  SAB Ectopic Multiple Living   1 1 1       1      Review of Systems  Eyes: Negative for pain.  Musculoskeletal: Negative for myalgias, neck pain and neck stiffness.  Neurological: Positive for headaches. Negative for focal weakness, seizures and paresthesias.  All other systems reviewed and are negative.     Allergies  Penicillins  Home Medications   Prior to Admission medications   Medication Sig Start Date End Date Taking? Authorizing Provider  acetaminophen (TYLENOL) 500 MG tablet Take 1,000 mg by mouth every 6 (six) hours as needed for moderate pain (pain).   Yes Historical Provider, MD  Aspirin-Acetaminophen-Caffeine (GOODY HEADACHE PO) Take 1 packet by mouth daily as needed (headache).   Yes Historical Provider, MD  ibuprofen (ADVIL,MOTRIN) 200 MG tablet Take 400-600 mg by mouth every 6 (six) hours as needed for moderate pain (pain).   Yes Historical Provider, MD  MICROGESTIN FE 1/20 1-20 MG-MCG tablet Take 1 tablet by mouth daily. 04/07/14  Yes Historical Provider, MD  Multiple Vitamins-Minerals (MULTI ADULT GUMMIES) CHEW Chew 2 lozenges by mouth daily.   Yes Historical Provider, MD  metoCLOPramide (REGLAN) 10 MG tablet Take 1 tablet (10 mg total) by mouth every 6 (six) hours as needed. Patient not taking: Reported on 05/31/2014 04/26/14   Dione Boozeavid Glick, MD  BP 114/71 mmHg  Pulse 61  Temp(Src) 97.9 F (36.6 C) (Oral)  Resp 18  Wt 200 lb (90.719 kg)  SpO2 100%  LMP 05/18/2014 (Approximate) Physical Exam  Constitutional: She is oriented to person, place, and time. She appears well-developed and well-nourished.  Non-toxic appearance. No distress.  HENT:  Head: Normocephalic and atraumatic.  Eyes: Conjunctivae, EOM and lids are normal. Pupils are equal, round, and reactive to light.  Neck: Normal range of motion. Neck supple. No tracheal deviation present. No thyroid mass present.  Cardiovascular: Normal rate, regular rhythm and normal heart sounds.  Exam reveals no gallop.    No murmur heard. Pulmonary/Chest: Effort normal and breath sounds normal. No stridor. No respiratory distress. She has no decreased breath sounds. She has no wheezes. She has no rhonchi. She has no rales.  Abdominal: Soft. Normal appearance and bowel sounds are normal. She exhibits no distension. There is no tenderness. There is no rebound and no CVA tenderness.  Musculoskeletal: Normal range of motion. She exhibits no edema or tenderness.  Neurological: She is alert and oriented to person, place, and time. She has normal strength. No cranial nerve deficit or sensory deficit. GCS eye subscore is 4. GCS verbal subscore is 5. GCS motor subscore is 6.  Skin: Skin is warm and dry. No abrasion and no rash noted.  Psychiatric: She has a normal mood and affect. Her speech is normal and behavior is normal.  Nursing note and vitals reviewed.   ED Course  Procedures (including critical care time) Labs Review Labs Reviewed  PREGNANCY, URINE    Imaging Review No results found.   EKG Interpretation None      MDM   Final diagnoses:  None    Patient given medications for headache and feels better. No focal neurological deficits noted. No red eyes for subarachnoid or meningitis. We given referral to neurology.    Lorre Nick, MD 05/31/14 1515

## 2014-06-08 ENCOUNTER — Ambulatory Visit (INDEPENDENT_AMBULATORY_CARE_PROVIDER_SITE_OTHER): Payer: PRIVATE HEALTH INSURANCE | Admitting: Neurology

## 2014-06-08 ENCOUNTER — Encounter: Payer: Self-pay | Admitting: Neurology

## 2014-06-08 VITALS — BP 113/73 | HR 68 | Ht 68.0 in | Wt 193.0 lb

## 2014-06-08 DIAGNOSIS — G43109 Migraine with aura, not intractable, without status migrainosus: Secondary | ICD-10-CM | POA: Diagnosis not present

## 2014-06-08 MED ORDER — TOPIRAMATE 25 MG PO TABS: ORAL_TABLET | ORAL | Status: DC

## 2014-06-08 MED ORDER — VENLAFAXINE HCL ER 37.5 MG PO CP24: ORAL_CAPSULE | ORAL | Status: DC

## 2014-06-08 MED ORDER — RIZATRIPTAN BENZOATE 5 MG PO TBDP: 5.0000 mg | ORAL_TABLET | ORAL | Status: DC | PRN

## 2014-06-08 NOTE — Progress Notes (Addendum)
PATIENT: Rose [name] DOB: [birth year]  Chief Complaint  Patient presents with  . Migraine    She has a history of migraines that typically respond to Tylenol.  Recently, she has started having migraines once weekly that are no longer responding to NSAIDS.  She has dizziness, nausea, light and noise sensitivity with these headaches.  She estimates getting one per week.  She was treated in the ED recently and given Fioricet which decreases the pain but does not resolve it completely.    HISTORICAL  Rose [name] 24 yo right-handed female, referred by emergency room for evaluation of frequent headaches,  She started to have headaches since 2010 during her pregnancy, after pregnancy, her headache has much improved, she only has migraines every few months, but since April 2016, she been having frequent migraines, presented to emergency room 3 times over past 1 months because of migraine,  Her typical migraines are bilateral temporal region severe pounding headaches with associated light noise sensitivity, hypersensitive to smell, sometimes preceded by Tunnel vision, left arm numbness  Trigger for her migraines are sleep deprivation, excessive coffee intake, weather change,  She has tried Tylenol, Excedrin Migraine, ibuprofen, Aleve, Fioricet, with somewhat help, Imitrex did not help in the past, has never tried preventive medications in the past,       REVIEW OF SYSTEMS: Full 14 system review of systems performed and notable only for blurred vision, double vision, eye pain, easy bruising, feeling hot, increased thirst, allergy, skin sensitivity, headaches, numbness, weakness, dizziness, anxiety, racing thoughts.  ALLERGIES: Allergies  Allergen Reactions  . Penicillins Hives    HOME MEDICATIONS: Current Outpatient Prescriptions  Medication Sig Dispense Refill  . butalbital-acetaminophen-caffeine (FIORICET) 50-325-40 MG per tablet Take 1-2 tablets by mouth every 6 (six)  hours as needed for headache. 20 tablet 0  . ibuprofen (ADVIL,MOTRIN) 200 MG tablet Take 400-600 mg by mouth every 6 (six) hours as needed for moderate pain (pain).    Marland Kitchen MICROGESTIN FE 1/20 1-20 MG-MCG tablet Take 1 tablet by mouth daily.  4  . Multiple Vitamins-Minerals (MULTI ADULT GUMMIES) CHEW Chew 2 lozenges by mouth daily.    . [DISCONTINUED] clidinium-chlordiazePOXIDE (LIBRAX) 5-2.5 MG per capsule Take 1 capsule by mouth 3 (three) times daily before meals. (Patient not taking: Reported on 02/16/2014) 60 capsule 3  . [DISCONTINUED] omeprazole (PRILOSEC) 20 MG capsule Take one capsule PO twice a day for 3 days, then one capsule PO once a day. (Patient not taking: Reported on 04/26/2014) 20 capsule 0     PAST MEDICAL HISTORY: Past Medical History  Diagnosis Date  . Asthma   . Migraine   . Hx of chlamydia infection     PAST SURGICAL HISTORY: Past Surgical History  Procedure Laterality Date  . Eye surgery    . Tympanostomy tube placement    . Lymph node biopsy      Left neck after cat scratch fever  . Laparoscopic appendectomy  02/03/2012    Procedure: APPENDECTOMY LAPAROSCOPIC;  Surgeon: Wilmon Arms. Corliss Skains, MD;  Location: [facility] OR;  Service: General;  Laterality: N/A;    FAMILY HISTORY: Family History  Problem Relation Age of Onset  . Lung cancer Maternal Grandmother   . COPD Mother   . Asthma Mother     SOCIAL HISTORY:  History   Social History  . Marital Status: Single    Spouse Name: N/A  . Number of Children: 1  . Years of Education: 14   Occupational History  .  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 Smoking status: Current Every Day Smoker    Types: Cigarettes  . Smokeless tobacco: Not on file     Comment: One pack per week.  . Alcohol Use: 0.0 oz/week    0 Standard drinks or equivalent per week     Comment: Occasion use - socially  . Drug Use: No  . Sexual Activity: Yes    Birth Control/ Protection: Pill   Other Topics Concern  . Not on file     Social History Narrative   Lives at home with her son.   Right-handed.   6-8 cups caffeine per day.     PHYSICAL EXAM   Filed Vitals:   06/08/14 1005  BP: 113/73  Pulse: 68  Height:  (1.727 m)  Weight: 193 lb (87.544 kg)    Not recorded      Body mass index is 29.35 kg/(m^2).  PHYSICAL EXAMNIATION:  Gen: NAD, conversant, well nourised, obese, well groomed                     Cardiovascular: Regular rate rhythm, no peripheral edema, warm, nontender. Eyes: Conjunctivae clear without exudates or hemorrhage Neck: Supple, no carotid bruise. Pulmonary: Clear to auscultation bilaterally   NEUROLOGICAL EXAM:  MENTAL STATUS: Speech:    Speech is normal; fluent and spontaneous with normal comprehension.  Cognition:    The patient is oriented to person, place, and time;     recent and remote memory intact;     language fluent;     normal attention, concentration,     fund of knowledge.  CRANIAL NERVES: CN II: Visual fields are full to confrontation. Fundoscopic exam is normal with sharp discs and no vascular changes. Venous pulsations are present bilaterally. Pupils are 4 mm and briskly reactive to light. Visual acuity is 20/20 bilaterally. CN III, IV, VI: extraocular movement are normal. No ptosis. CN V: Facial sensation is intact to pinprick in all 3 divisions bilaterally. Corneal responses are intact.  CN VII: Face is symmetric with normal eye closure and smile. CN VIII: Hearing is normal to rubbing fingers CN IX, X: Palate elevates symmetrically. Phonation is normal. CN XI: Head turning and shoulder shrug are intact CN XII: Tongue is midline with normal movements and no atrophy.  MOTOR: There is no pronator drift of out-stretched arms. Muscle bulk and tone are normal. Muscle strength is normal.  REFLEXES: Reflexes are 2+ and symmetric at the biceps, triceps, knees, and ankles. Plantar responses are flexor.  SENSORY: Light touch, pinprick, position sense,  and vibration sense are intact in fingers and toes.  COORDINATION: Rapid alternating movements and fine finger movements are intact. There is no dysmetria on finger-to-nose and heel-knee-shin. There are no abnormal or extraneous movements.   GAIT/STANCE: Posture is normal. Gait is steady with normal steps, base, arm swing, and turning. Heel and toe walking are normal. Tandem gait is normal.  Romberg is absent.   DIAGNOSTIC DATA (LABS, IMAGING, TESTING) - I reviewed patient records, labs, notes, testing and imaging myself where available.  Lab Results  Component Value Date   WBC 5.4 04/26/2014   HGB 14.6 04/26/2014   HCT 43.0 04/26/2014   MCV 91.2 04/26/2014   PLT 193 04/26/2014      Component Value Date/Time   NA 141 04/26/2014 1925   K 3.7 04/26/2014 1925   CL 103 04/26/2014 1925   CO2 24 04/26/2014 1918  GLUCOSE 90 04/26/2014 1925   BUN 9 04/26/2014 1925   CREATININE 0.80 04/26/2014 1925   CALCIUM 9.0 04/26/2014 1918   PROT 6.6 04/26/2014 1918   ALBUMIN 4.0 04/26/2014 1918   AST 19 04/26/2014 1918   ALT 15 04/26/2014 1918   ALKPHOS 48 04/26/2014 1918   BILITOT 0.8 04/26/2014 1918   GFRNONAA >90 04/26/2014 1918   GFRAA >90 04/26/2014 1918   Lab Results  Component Value Date   CHOL 128 05/22/2007    ASSESSMENT AND PLAN  Shlonda A [name] is a 24 y.o. female with frequent migraine headaches, anxiety, normal neurological examinations  1. Start preventive medications, Topamax 25 mg, titrating to 2 tablets twice a day 2. Effexor XR 37.5 milligrams 2 tablets every morning 3. Maxalt as needed 4. Return to clinic in one month     Levert FeinsteinYijun Danylle Ouk, M.D. Ph.D.  [facility] [address], [address] [place], [place] [address] Ph: [phone] Fax: [phone]

## 2014-06-08 NOTE — Progress Notes (Signed)
PATIENT: Rose [name] DOB: 05/16/1990  Chief Complaint  Patient presents with  . Migraine    She has a history of migraines that typically respond to Tylenol.  Recently, she has started having migraines once weekly that are no longer responding to NSAIDS.  She has dizziness, nausea, light and noise sensitivity with these headaches.  She estimates getting one per week.  She was treated in the ED recently and given Fioricet which decreases the pain but does not resolve it completely.    HISTORICAL  Rose [name] is a 24 years old right-handed female, referred by emergency room for evaluation of frequent headaches   She had headaches at age 24, it used to happen few times each months, bilateral temporal area pulsating pain, with associated light noise smell sensitivity, improved by sleeping for a few hours, she had increased frequency of headaches for a while during her pregnancy 5 years ago, improved afterwards  Since April 2016, she has more frequent headaches, presented to emergency room 3 times, was evaluated by neuro hospitalists April 26 2014, she presented with severe prolonged headaches, with associated left hand numbness, weakness, was not able to pick up anything from her left hand, I have reviewed MRI of the brain in April 26 2014, that was normal  Laboratory evaluation showed normal CMP, CBC, UDS was positive for marijuana  She is now having couple headaches each week, trigger for her headaches are sleep deprivation, weather changes, excessive caffeine intake,  She has tried over-the-counter medications, Tylenol, Excedrin Migraine, ibuprofen, Aleve, only helped her mildly, Fioricet was more helpful, previously tried Imitrex without help, she has not taking any preventive medications in the past  REVIEW OF SYSTEMS: Full 14 system review of systems performed and notable only for as above  ALLERGIES: Allergies  Allergen Reactions  . Penicillins Hives    HOME  MEDICATIONS: Current Outpatient Prescriptions  Medication Sig Dispense Refill  . butalbital-acetaminophen-caffeine (FIORICET) 50-325-40 MG per tablet Take 1-2 tablets by mouth every 6 (six) hours as needed for headache. 20 tablet 0  . ibuprofen (ADVIL,MOTRIN) 200 MG tablet Take 400-600 mg by mouth every 6 (six) hours as needed for moderate pain (pain).    Marland Kitchen. MICROGESTIN FE 1/20 1-20 MG-MCG tablet Take 1 tablet by mouth daily.  4  . Multiple Vitamins-Minerals (MULTI ADULT GUMMIES) CHEW Chew 2 lozenges by mouth daily.    . [DISCONTINUED] clidinium-chlordiazePOXIDE (LIBRAX) 5-2.5 MG per capsule Take 1 capsule by mouth 3 (three) times daily before meals. (Patient not taking: Reported on 02/16/2014) 60 capsule 3  . [DISCONTINUED] omeprazole (PRILOSEC) 20 MG capsule Take one capsule PO twice a day for 3 days, then one capsule PO once a day. (Patient not taking: Reported on 04/26/2014) 20 capsule 0     PAST MEDICAL HISTORY: Past Medical History  Diagnosis Date  . Asthma   . Migraine   . Hx of chlamydia infection     PAST SURGICAL HISTORY: Past Surgical History  Procedure Laterality Date  . Eye surgery    . Tympanostomy tube placement    . Lymph node biopsy      Left neck after cat scratch fever  . Laparoscopic appendectomy  02/03/2012    Procedure: APPENDECTOMY LAPAROSCOPIC;  Surgeon: Wilmon ArmsMatthew K. Corliss Skainssuei, MD;  Location: [facility] OR;  Service: General;  Laterality: N/A;    FAMILY HISTORY: Family History  Problem Relation Age of Onset  . Lung cancer Maternal Grandmother   . COPD Mother   . Asthma  Mother     SOCIAL HISTORY:  History   Social History  . Marital Status: Single    Spouse Name: N/A  . Number of Children: 1  . Years of Education: 14   Occupational History  . 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 Smoking status: Current Every Day Smoker    Types: Cigarettes  . Smokeless tobacco: Not on file     Comment: One pack per week.  . Alcohol Use: 0.0 oz/week    0  Standard drinks or equivalent per week     Comment: Occasion use - socially  . Drug Use: No  . Sexual Activity: Yes    Birth Control/ Protection: Pill   Other Topics Concern  . Not on file   Social History Narrative   Lives at home with her son.   Right-handed.   6-8 cups caffeine per day.     PHYSICAL EXAM   Filed Vitals:   06/08/14 1005  BP: 113/73  Pulse: 68  Height:  (1.727 m)  Weight: 193 lb (87.544 kg)    Not recorded      Body mass index is 29.35 kg/(m^2).  PHYSICAL EXAMNIATION:  Gen: NAD, conversant, well nourised, obese, well groomed                     Cardiovascular: Regular rate rhythm, no peripheral edema, warm, nontender. Eyes: Conjunctivae clear without exudates or hemorrhage Neck: Supple, no carotid bruise. Pulmonary: Clear to auscultation bilaterally   NEUROLOGICAL EXAM:  MENTAL STATUS: Speech:    Speech is normal; fluent and spontaneous with normal comprehension.  Cognition:    The patient is oriented to person, place, and time;     recent and remote memory intact;     language fluent;     normal attention, concentration,     fund of knowledge.  CRANIAL NERVES: CN II: Visual fields are full to confrontation. Fundoscopic exam is normal with sharp discs and no vascular changes. Pupil equal round reactive to light  CN III, IV, VI: extraocular movement are normal. No ptosis. CN V: Facial sensation is intact to pinprick in all 3 divisions bilaterally. Corneal responses are intact.  CN VII: Face is symmetric with normal eye closure and smile. CN VIII: Hearing is normal to rubbing fingers CN IX, X: Palate elevates symmetrically. Phonation is normal. CN XI: Head turning and shoulder shrug are intact CN XII: Tongue is midline with normal movements and no atrophy.  MOTOR: There is no pronator drift of out-stretched arms. Muscle bulk and tone are normal. Muscle strength is normal.  REFLEXES: Reflexes are 2+ and symmetric at the biceps,  triceps, knees, and ankles. Plantar responses are flexor.  SENSORY: Light touch, pinprick, position sense, and vibration sense are intact in fingers and toes.  COORDINATION: Rapid alternating movements and fine finger movements are intact. There is no dysmetria on finger-to-nose and heel-knee-shin. There are no abnormal or extraneous movements.   GAIT/STANCE: Posture is normal. Gait is steady with normal steps, base, arm swing, and turning. Heel and toe walking are normal. Tandem gait is normal.  Romberg is absent.   DIAGNOSTIC DATA (LABS, IMAGING, TESTING) - I reviewed patient records, labs, notes, testing and imaging myself where available.  Lab Results  Component Value Date   WBC 5.4 04/26/2014   HGB 14.6 04/26/2014   HCT 43.0 04/26/2014   MCV 91.2 04/26/2014   PLT 193 04/26/2014  Component Value Date/Time   NA 141 04/26/2014 1925   K 3.7 04/26/2014 1925   CL 103 04/26/2014 1925   CO2 24 04/26/2014 1918   GLUCOSE 90 04/26/2014 1925   BUN 9 04/26/2014 1925   CREATININE 0.80 04/26/2014 1925   CALCIUM 9.0 04/26/2014 1918   PROT 6.6 04/26/2014 1918   ALBUMIN 4.0 04/26/2014 1918   AST 19 04/26/2014 1918   ALT 15 04/26/2014 1918   ALKPHOS 48 04/26/2014 1918   BILITOT 0.8 04/26/2014 1918   GFRNONAA >90 04/26/2014 1918   GFRAA >90 04/26/2014 1918   Lab Results  Component Value Date   CHOL 128 05/22/2007    ASSESSMENT AND PLAN  Rose [name] is a 24 y.o. female  with history of migraine, presenting with frequent headaches, normal neurological examinations, normal MRI of brain   1, preventive medication Topamax, titrating to 25 mg 2 tablets twice a day 2, Maxalt as needed 3. She also complains of depression, anxiety, we are starting Effexor 37.5 milligrams titrating to 2 tablets every morning 4. Return to clinic in one month   Levert Feinstein, M.D. Ph.D.  [facility] [address] [place], [place] [address] Ph: [phone] Fax: [phone]

## 2014-07-21 ENCOUNTER — Encounter: Payer: Self-pay | Admitting: Neurology

## 2014-07-21 ENCOUNTER — Ambulatory Visit (INDEPENDENT_AMBULATORY_CARE_PROVIDER_SITE_OTHER): Payer: PRIVATE HEALTH INSURANCE | Admitting: Neurology

## 2014-07-21 VITALS — BP 109/74 | HR 65 | Ht 68.0 in | Wt 201.0 lb

## 2014-07-21 DIAGNOSIS — G43109 Migraine with aura, not intractable, without status migrainosus: Secondary | ICD-10-CM | POA: Diagnosis not present

## 2014-07-21 MED ORDER — TOPIRAMATE ER 100 MG PO CAP24: 100.0000 mg | ORAL_CAPSULE | Freq: Every day | ORAL | Status: DC

## 2014-07-21 NOTE — Progress Notes (Signed)
Chief Complaint  Patient presents with  . Migraine    Feels Topamax 50mg , BID has improved her migraines but she has noticed she has become more forgetful.  She has not had a headache severe enough that she needed Maxalt.    . Mood Swings    She started taking Effexor XR 37.5 daily at last visit.  She just increased her dosage to two tablets daily on 07/11/14.  Says she is "moody and hateful" all the time.  Also, says she is not sleeping well.      PATIENT: Rose [name] DOB: 10/31/1990  Chief Complaint  Patient presents with  . Migraine    Feels Topamax 50mg , BID has improved her migraines but she has noticed she has become more forgetful.  She has not had a headache severe enough that she needed Maxalt.    . Mood Swings    She started taking Effexor XR 37.5 daily at last visit.  She just increased her dosage to two tablets daily on 07/11/14.  Says she is "moody and hateful" all the time.  Also, says she is not sleeping well.    HISTORICAL  Rose [name] 24 yo right-handed female, referred by emergency room for evaluation of frequent headaches,  She started to have headaches since 2010 during her pregnancy, after pregnancy, her headache has much improved, she only has migraines every few months, but since April 2016, she been having frequent migraines, presented to emergency room 3 times over past 1 month because of migraine,  Her typical migraines are bilateral temporal region severe pounding headaches with associated light noise sensitivity, hypersensitive to smell, sometimes preceded by Tunnel vision, left arm numbness  Trigger for her migraines are sleep deprivation, excessive coffee intake, weather change,  She has tried Tylenol, Excedrin Migraine, ibuprofen, Aleve, Fioricet, with somewhat help, Imitrex did not help in the past, has never tried preventive medications in the past,   UPDATE July 21 2014: She had few headaches, pressure, not typical migraine, she is now  taking topamax 25 mg 2 tablets twice a day, which does help her headaches, but she complains of being forgetful, she is now taking Effexor 37.5 milligrams 2 tablets daily, she still complains of moody, hateful sometimes.   REVIEW OF SYSTEMS: Full 14 system review of systems performed and notable only for as above   ALLERGIES: Allergies  Allergen Reactions  . Penicillins Hives    HOME MEDICATIONS: Current Outpatient Prescriptions  Medication Sig Dispense Refill  . butalbital-acetaminophen-caffeine (FIORICET) 50-325-40 MG per tablet Take 1-2 tablets by mouth every 6 (six) hours as needed for headache. 20 tablet 0  . ibuprofen (ADVIL,MOTRIN) 200 MG tablet Take 400-600 mg by mouth every 6 (six) hours as needed for moderate pain (pain).    Marland Kitchen. MICROGESTIN FE 1/20 1-20 MG-MCG tablet Take 1 tablet by mouth daily.  4  . Multiple Vitamins-Minerals (MULTI ADULT GUMMIES) CHEW Chew 2 lozenges by mouth daily.    . [DISCONTINUED] clidinium-chlordiazePOXIDE (LIBRAX) 5-2.5 MG per capsule Take 1 capsule by mouth 3 (three) times daily before meals. (Patient not taking: Reported on 02/16/2014) 60 capsule 3  . [DISCONTINUED] omeprazole (PRILOSEC) 20 MG capsule Take one capsule PO twice a day for 3 days, then one capsule PO once a day. (Patient not taking: Reported on 04/26/2014) 20 capsule 0     PAST MEDICAL HISTORY: Past Medical History  Diagnosis Date  . Asthma   . Migraine   . Hx of chlamydia infection  PAST SURGICAL HISTORY: Past Surgical History  Procedure Laterality Date  . Eye surgery    . Tympanostomy tube placement    . Lymph node biopsy      Left neck after cat scratch fever  . Laparoscopic appendectomy  02/03/2012    Procedure: APPENDECTOMY LAPAROSCOPIC;  Surgeon: Wilmon Arms. Corliss Skains, MD;  Location: [facility] OR;  Service: General;  Laterality: N/A;    FAMILY HISTORY: Family History  Problem Relation Age of Onset  . Lung cancer Maternal Grandmother   . COPD Mother   . Asthma Mother      SOCIAL HISTORY:  History   Social History  . Marital Status: Single    Spouse Name: N/A  . Number of Children: 1  . Years of Education: 14   Occupational History  . 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 Smoking status: Current Every Day Smoker    Types: Cigarettes  . Smokeless tobacco: Not on file     Comment: One pack per week.  . Alcohol Use: 0.0 oz/week    0 Standard drinks or equivalent per week     Comment: Occasion use - socially  . Drug Use: No  . Sexual Activity: Yes    Birth Control/ Protection: Pill   Other Topics Concern  . Not on file   Social History Narrative   Lives at home with her son.   Right-handed.   6-8 cups caffeine per day.     PHYSICAL EXAM   Filed Vitals:   07/21/14 0925  BP: 109/74  Pulse: 65  Height: 5\' 8"  (1.727 m)  Weight: 201 lb (91.173 kg)    Not recorded      Body mass index is 30.57 kg/(m^2).  PHYSICAL EXAMNIATION:  Gen: NAD, conversant, well nourised, obese, well groomed                     Cardiovascular: Regular rate rhythm, no peripheral edema, warm, nontender. Eyes: Conjunctivae clear without exudates or hemorrhage Neck: Supple, no carotid bruise. Pulmonary: Clear to auscultation bilaterally   NEUROLOGICAL EXAM:  MENTAL STATUS: Speech:    Speech is normal; fluent and spontaneous with normal comprehension.  Cognition:    The patient is oriented to person, place, and time;     recent and remote memory intact;     language fluent;     normal attention, concentration,     fund of knowledge.  CRANIAL NERVES: CN II: Visual fields are full to confrontation. Fundoscopic exam is normal with sharp discs and no vascular changes.   Pupils are 4 mm and briskly reactive to light. Visual acuity is 20/20 bilaterally. CN III, IV, VI: extraocular movement are normal. No ptosis. CN V: Facial sensation is intact to pinprick in all 3 divisions bilaterally. Corneal responses are intact.  CN VII: Face is  symmetric with normal eye closure and smile. CN VIII: Hearing is normal to rubbing fingers CN IX, X: Palate elevates symmetrically. Phonation is normal. CN XI: Head turning and shoulder shrug are intact CN XII: Tongue is midline with normal movements and no atrophy.  MOTOR: There is no pronator drift of out-stretched arms. Muscle bulk and tone are normal. Muscle strength is normal.  REFLEXES: Reflexes are 2+ and symmetric at the biceps, triceps, knees, and ankles. Plantar responses are flexor.  SENSORY: Light touch, pinprick, position sense, and vibration sense are intact in fingers and toes.  COORDINATION: Rapid alternating movements and fine finger movements are  intact. There is no dysmetria on finger-to-nose and heel-knee-shin. There are no abnormal or extraneous movements.   GAIT/STANCE: Posture is normal. Gait is steady with normal steps, base, arm swing, and turning. Heel and toe walking are normal. Tandem gait is normal.  Romberg is absent.   DIAGNOSTIC DATA (LABS, IMAGING, TESTING) - I reviewed patient records, labs, notes, testing and imaging myself where available.  Lab Results  Component Value Date   WBC 5.4 04/26/2014   HGB 14.6 04/26/2014   HCT 43.0 04/26/2014   MCV 91.2 04/26/2014   PLT 193 04/26/2014      Component Value Date/Time   NA 141 04/26/2014 1925   K 3.7 04/26/2014 1925   CL 103 04/26/2014 1925   CO2 24 04/26/2014 1918   GLUCOSE 90 04/26/2014 1925   BUN 9 04/26/2014 1925   CREATININE 0.80 04/26/2014 1925   CALCIUM 9.0 04/26/2014 1918   PROT 6.6 04/26/2014 1918   ALBUMIN 4.0 04/26/2014 1918   AST 19 04/26/2014 1918   ALT 15 04/26/2014 1918   ALKPHOS 48 04/26/2014 1918   BILITOT 0.8 04/26/2014 1918   GFRNONAA >90 04/26/2014 1918   GFRAA >90 04/26/2014 1918   Lab Results  Component Value Date   CHOL 128 05/22/2007    ASSESSMENT AND PLAN  Rose [name] is a 24 y.o. female with frequent migraine headaches, anxiety, normal  neurological examinations  Chronic migraine  Change to Trokendi 100 mg daily as migraine prevention Maxalt as needed Return to clinic with nurse practitioner in 6 months   Levert Feinstein, M.D. Ph.D.  [facility] [address] [place], [place] [address] Phone: [phone] Fax:      [phone]

## 2014-08-03 ENCOUNTER — Other Ambulatory Visit: Payer: Self-pay | Admitting: Obstetrics and Gynecology

## 2014-08-04 LAB — CYTOLOGY - PAP

## 2014-09-05 ENCOUNTER — Encounter (HOSPITAL_COMMUNITY): Payer: Self-pay | Admitting: Emergency Medicine

## 2014-09-05 ENCOUNTER — Emergency Department (HOSPITAL_COMMUNITY)
Admission: EM | Admit: 2014-09-05 | Discharge: 2014-09-05 | Disposition: A | Discharge status: Home / Self Care | Payer: PRIVATE HEALTH INSURANCE | Attending: Emergency Medicine | Admitting: Emergency Medicine

## 2014-09-05 DIAGNOSIS — Y92410 Unspecified street and highway as the place of occurrence of the external cause: Secondary | ICD-10-CM | POA: Diagnosis not present

## 2014-09-05 DIAGNOSIS — Z8619 Personal history of other infectious and parasitic diseases: Secondary | ICD-10-CM | POA: Diagnosis not present

## 2014-09-05 DIAGNOSIS — S199XXA Unspecified injury of neck, initial encounter: Secondary | ICD-10-CM | POA: Diagnosis present

## 2014-09-05 DIAGNOSIS — S134XXA Sprain of ligaments of cervical spine, initial encounter: Secondary | ICD-10-CM | POA: Diagnosis not present

## 2014-09-05 DIAGNOSIS — S3992XA Unspecified injury of lower back, initial encounter: Secondary | ICD-10-CM | POA: Diagnosis not present

## 2014-09-05 DIAGNOSIS — Z720 Tobacco use: Secondary | ICD-10-CM | POA: Diagnosis not present

## 2014-09-05 DIAGNOSIS — J45909 Unspecified asthma, uncomplicated: Secondary | ICD-10-CM | POA: Insufficient documentation

## 2014-09-05 DIAGNOSIS — Z79899 Other long term (current) drug therapy: Secondary | ICD-10-CM | POA: Diagnosis not present

## 2014-09-05 DIAGNOSIS — Z880 Allergy status to penicillin: Secondary | ICD-10-CM | POA: Insufficient documentation

## 2014-09-05 DIAGNOSIS — S139XXA Sprain of joints and ligaments of unspecified parts of neck, initial encounter: Secondary | ICD-10-CM

## 2014-09-05 DIAGNOSIS — G43909 Migraine, unspecified, not intractable, without status migrainosus: Secondary | ICD-10-CM | POA: Diagnosis not present

## 2014-09-05 DIAGNOSIS — M545 Low back pain, unspecified: Secondary | ICD-10-CM

## 2014-09-05 DIAGNOSIS — Y9389 Activity, other specified: Secondary | ICD-10-CM | POA: Diagnosis not present

## 2014-09-05 DIAGNOSIS — Y998 Other external cause status: Secondary | ICD-10-CM | POA: Diagnosis not present

## 2014-09-05 MED ORDER — METHOCARBAMOL 500 MG PO TABS: 500.0000 mg | ORAL_TABLET | Freq: Two times a day (BID) | ORAL | Status: DC

## 2014-09-05 MED ORDER — NAPROXEN 500 MG PO TABS
500.0000 mg | ORAL_TABLET | Freq: Two times a day (BID) | ORAL | Status: DC
Start: 2014-09-05 — End: 2015-04-03
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
## 2014-11-04 ENCOUNTER — Other Ambulatory Visit: Payer: Self-pay | Admitting: Obstetrics and Gynecology

## 2015-01-08 NOTE — L&D Delivery Note (Signed)
Delivery Summary for Rose [name]  Labor Events:   Preterm labor:   Rupture date:   Rupture time:   Rupture type: Artificial  Fluid Color: Clear  Induction:   Augmentation:   Complications:   Cervical ripening:          Delivery:   Episiotomy:   Lacerations:   Repair suture:   Repair # of packets:   Blood loss (ml): 500   Information for the patient's newborn:  [name], Boy [place] [[id number]][[id number]]    Delivery 11/14/2015 9:36 AM by  Vaginal, Spontaneous Delivery Sex:  female Gestational Age: [id number] Delivery Clinician:   Living?:         APGARS  One minute Five minutes Ten minutes  Skin color:        Heart rate:        Grimace:        Muscle tone:        Breathing:        Totals: 9  9      Presentation/position:      Resuscitation:   Cord information:    Disposition of cord blood:     Blood gases sent?  Complications:   Placenta: Delivered:       appearance Newborn Measurements: Weight: 8 lb 10.3 oz (3920 g)  Height: 22"  Head circumference:    Chest circumference:    Other providers:    Additional  information: Forceps:   Vacuum:   Breech:   Observed anomalies       Delivery Note At 9:36 AM a viable and healthy female was delivered via Vaginal, Spontaneous Delivery (Presentation: Vertex (compound hand); ROA ).  APGAR: 9, 9; weight 8 lb 10.3 oz (3920 g).   Placenta status: intact, spontaneously removed.  Cord: 3-vessel, with the following complications: None.  Cord pH: not obtained.  Anesthesia:  Epidural Episiotomy: None Lacerations: 1st degree perineal (hemostatic) Suture Repair: None Est. Blood Loss (mL):  500 ml (treated with Cytotec PR 800 mcg)  Mom to postpartum.  Baby to Couplet care / Skin to Skin.  [name] 11/15/2015, 12:00 PM

## 2015-01-23 ENCOUNTER — Ambulatory Visit: Payer: PRIVATE HEALTH INSURANCE | Admitting: Adult Health

## 2015-01-30 ENCOUNTER — Ambulatory Visit: Payer: PRIVATE HEALTH INSURANCE | Admitting: Adult Health

## 2015-03-25 ENCOUNTER — Inpatient Hospital Stay (HOSPITAL_COMMUNITY)
Admission: AD | Admit: 2015-03-25 | Discharge: 2015-03-25 | Disposition: A | Discharge status: Home / Self Care | Payer: Medicaid Other | Source: Ambulatory Visit | Attending: Obstetrics & Gynecology | Admitting: Obstetrics & Gynecology

## 2015-03-25 DIAGNOSIS — Z139 Encounter for screening, unspecified: Secondary | ICD-10-CM

## 2015-03-25 DIAGNOSIS — Z3200 Encounter for pregnancy test, result unknown: Secondary | ICD-10-CM | POA: Insufficient documentation

## 2015-03-25 NOTE — MAU Note (Signed)
Patient presents for confirmation of pregnancy. +HPT this morning. Denies pain, bleeding or discharge but states she has been fatigued and constipated.

## 2015-03-25 NOTE — Discharge Instructions (Signed)
[facility] for your pregnancy verification Monday through Thursday 8am till 4 pm.

## 2015-03-25 NOTE — MAU Provider Note (Signed)
Rose [name] is a 25 y.o. G1P1001 who presents to the MAU for pregnancy test and pregnancy verification letter. She denies vaginal bleeding, pain or any problems.  BP 137/77 mmHg  Pulse 71  Temp(Src) 98.3 F (36.8 C) (Oral)  Resp 18  Ht 5' 7.5" (1.715 m)  Wt 220 lb 4 oz (99.905 kg)  BMI 33.97 kg/m2  LMP 02/10/2015  Patient appears comfortable and in NAD.  Discussed with the patient that the pregnancy verification is done in the Clinic and she can go on Monday between 8 and 5. She voices understanding and agrees with plan.   Medical screening exam complete and patient stable to f/u with the Clinic for pregnancy test and pregnancy verification.

## 2015-03-25 NOTE — MAU Note (Addendum)
Kerrie BuffaloHope Neese NP in to talk with pt regarding pregnancy test and returning to clinic Monday for upt and confirmation of pregnancy. Pt d/c home from Triage

## 2015-03-29 ENCOUNTER — Ambulatory Visit (INDEPENDENT_AMBULATORY_CARE_PROVIDER_SITE_OTHER): Payer: Self-pay

## 2015-03-29 ENCOUNTER — Encounter: Payer: Self-pay | Admitting: Obstetrics & Gynecology

## 2015-03-29 DIAGNOSIS — Z3201 Encounter for pregnancy test, result positive: Secondary | ICD-10-CM

## 2015-03-29 LAB — POCT URINALYSIS DIP (DEVICE)
BILIRUBIN URINE: NEGATIVE
GLUCOSE, UA: NEGATIVE mg/dL
Hgb urine dipstick: NEGATIVE
Ketones, ur: NEGATIVE mg/dL
LEUKOCYTES UA: NEGATIVE
NITRITE: NEGATIVE
Protein, ur: NEGATIVE mg/dL
Specific Gravity, Urine: 1.02 (ref 1.005–1.030)
Urobilinogen, UA: 0.2 mg/dL (ref 0.0–1.0)
pH: 6 (ref 5.0–8.0)

## 2015-03-29 LAB — POCT PREGNANCY, URINE: Preg Test, Ur: POSITIVE — AB

## 2015-03-29 NOTE — Progress Notes (Signed)
Pt here today for pregnancy test.  Resulted positive.  Pt states that her LMP is 02/10/15 with EDD 11/17/15.  Proof of pregnancy provided for patient to receive OB care at her choice.

## 2015-04-03 ENCOUNTER — Ambulatory Visit (INDEPENDENT_AMBULATORY_CARE_PROVIDER_SITE_OTHER): Payer: Medicaid Other | Admitting: Obstetrics and Gynecology

## 2015-04-03 VITALS — BP 112/85 | HR 69 | Wt 219.4 lb

## 2015-04-03 DIAGNOSIS — M545 Low back pain: Secondary | ICD-10-CM

## 2015-04-03 DIAGNOSIS — N926 Irregular menstruation, unspecified: Secondary | ICD-10-CM

## 2015-04-03 DIAGNOSIS — Z3687 Encounter for antenatal screening for uncertain dates: Secondary | ICD-10-CM

## 2015-04-03 DIAGNOSIS — R350 Frequency of micturition: Secondary | ICD-10-CM

## 2015-04-03 DIAGNOSIS — Z113 Encounter for screening for infections with a predominantly sexual mode of transmission: Secondary | ICD-10-CM

## 2015-04-03 DIAGNOSIS — R638 Other symptoms and signs concerning food and fluid intake: Secondary | ICD-10-CM

## 2015-04-03 DIAGNOSIS — Z36 Encounter for antenatal screening of mother: Secondary | ICD-10-CM

## 2015-04-03 DIAGNOSIS — Z331 Pregnant state, incidental: Secondary | ICD-10-CM

## 2015-04-03 DIAGNOSIS — Z1389 Encounter for screening for other disorder: Secondary | ICD-10-CM | POA: Diagnosis not present

## 2015-04-03 DIAGNOSIS — Z3490 Encounter for supervision of normal pregnancy, unspecified, unspecified trimester: Secondary | ICD-10-CM

## 2015-04-03 DIAGNOSIS — Z369 Encounter for antenatal screening, unspecified: Secondary | ICD-10-CM

## 2015-04-03 LAB — POCT URINALYSIS DIPSTICK
BILIRUBIN UA: NEGATIVE
Blood, UA: NEGATIVE
Glucose, UA: NEGATIVE
KETONES UA: NEGATIVE
Nitrite, UA: NEGATIVE
PH UA: 6.5
PROTEIN UA: NEGATIVE
SPEC GRAV UA: 1.015
Urobilinogen, UA: NEGATIVE

## 2015-04-03 NOTE — Patient Instructions (Signed)
Pregnancy and Zika Virus Disease Zika virus disease, or Zika, is an illness that can spread to people from mosquitoes that carry the virus. It may also spread from person to person through infected body fluids. Zika first occurred in [place], but recently it has spread to new areas. The virus occurs in tropical climates. The location of Zika continues to change. Most people who become infected with Zika virus do not develop serious illness. However, Zika may cause birth defects in an unborn baby whose mother is infected with the virus. It may also increase the risk of miscarriage. WHAT ARE THE SYMPTOMS OF ZIKA VIRUS DISEASE? In many cases, people who have been infected with Zika virus do not develop any symptoms. If symptoms appear, they usually start about a week after the person is infected. Symptoms are usually mild. They may include:  Fever.  Rash.  Red eyes.  Joint pain. HOW DOES ZIKA VIRUS DISEASE SPREAD? The main way that Zika virus spreads is through the bite of a certain type of mosquito. Unlike most types of mosquitos, which bite only at night, the type of mosquito that carries Zika virus bites both at night and during the day. Zika virus can also spread through sexual contact, through a blood transfusion, and from a mother to her baby before or during birth. Once you have had Zika virus disease, it is unlikely that you will get it again. CAN I PASS ZIKA TO MY BABY DURING PREGNANCY? Yes, Zika can pass from a mother to her baby before or during birth. WHAT PROBLEMS CAN ZIKA CAUSE FOR MY BABY? A woman who is infected with Zika virus while pregnant is at risk of having her baby born with a condition in which the brain or head is smaller than expected (microcephaly). Babies who have microcephaly can have developmental delays, seizures, hearing problems, and vision problems. Having Zika virus disease during pregnancy can also increase the risk of miscarriage. HOW CAN ZIKA VIRUS DISEASE BE  PREVENTED? There is no vaccine to prevent Zika. The best way to prevent the disease is to avoid infected mosquitoes and avoid exposure to body fluids that can spread the virus. Avoid any possible exposure to Zika by taking the following precautions. For women and their sex partners:  Avoid traveling to high-risk areas. The locations where Zika is being reported change often. To identify high-risk areas, check the CDC travel website: www.cdc.gov/zika/geo/index.html  If you or your sex partner must travel to a high-risk area, talk with a health care provider before and after traveling.  Take all precautions to avoid mosquito bites if you live in, or travel to, any of the high-risk areas. Insect repellents are safe to use during pregnancy.  Ask your health care provider when it is safe to have sexual contact. For women:  If you are pregnant or trying to become pregnant, avoid sexual contact with persons who may have been exposed to Zika virus, persons who have possible symptoms of Zika, or persons whose history you are unsure about. If you choose to have sexual contact with someone who may have been exposed to Zika virus, use condoms correctly during the entire duration of sexual activity, every time. Do not share sexual devices, as you may be exposed to body fluids.  Ask your health care provider about when it is safe to attempt pregnancy after a possible exposure to Zika virus. WHAT STEPS SHOULD I TAKE TO AVOID MOSQUITO BITES? Take these steps to avoid mosquito bites when you are   in a high-risk area:  Wear loose clothing that covers your arms and legs.  Limit your outdoor activities.  Do not open windows unless they have window screens.  Sleep under mosquito nets.  Use insect repellent. The best insect repellents have:  DEET, picaridin, oil of lemon eucalyptus (OLE), or IR3535 in them.  Higher amounts of an active ingredient in them.  Remember that insect repellents are safe to use  during pregnancy.  Do not use OLE on children who are younger than 3 years of age. Do not use insect repellent on babies who are younger than 2 months of age.  Cover your child's stroller with mosquito netting. Make sure the netting fits snugly and that any loose netting does not cover your child's mouth or nose. Do not use a blanket as a mosquito-protection cover.  Do not apply insect repellent underneath clothing.  If you are using sunscreen, apply the sunscreen before applying the insect repellent.  Treat clothing with permethrin. Do not apply permethrin directly to your skin. Follow label directions for safe use.  Get rid of standing water, where mosquitoes may reproduce. Standing water is often found in items such as buckets, bowls, animal food dishes, and flowerpots. When you return from traveling to any high-risk area, continue taking actions to protect yourself against mosquito bites for 3 weeks, even if you show no signs of illness. This will prevent spreading Zika virus to uninfected mosquitoes. WHAT SHOULD I KNOW ABOUT THE SEXUAL TRANSMISSION OF ZIKA? People can spread Zika to their sexual partners during vaginal, anal, or oral sex, or by sharing sexual devices. Many people with Zika do not develop symptoms, so a person could spread the disease without knowing that they are infected. The greatest risk is to women who are pregnant or who may become pregnant. Zika virus can live longer in semen than it can live in blood. Couples can prevent sexual transmission of the virus by:  Using condoms correctly during the entire duration of sexual activity, every time. This includes vaginal, anal, and oral sex.  Not sharing sexual devices. Sharing increases your risk of being exposed to body fluid from another person.  Avoiding all sexual activity until your health care provider says it is safe. SHOULD I BE TESTED FOR ZIKA VIRUS? A sample of your blood can be tested for Zika virus. A pregnant  woman should be tested if she may have been exposed to the virus or if she has symptoms of Zika. She may also have additional tests done during her pregnancy, such ultrasound testing. Talk with your health care provider about which tests are recommended.   This information is not intended to replace advice given to you by your health care provider. Make sure you discuss any questions you have with your health care provider.   Document Released: 09/14/2014 Document Reviewed: 09/07/2014 Elsevier Interactive Patient Education 2016 [employer] Minor Illnesses and Medications in Pregnancy  Cold/Flu:  Sudafed for congestion- Robitussin (plain) for cough- Tylenol for discomfort.  Please follow the directions on the label.  Try not to take any more than needed.  OTC Saline nasal spray and air humidifier or cool-mist  Vaporizer to sooth nasal irritation and to loosen congestion.  It is also important to increase intake of non carbonated fluids, especially if you have a fever.  Constipation:  Colace-2 capsules at bedtime; Metamucil- follow directions on label; Senokot- 1 tablet at bedtime.  Any one of these medications can be used.  It is also   very important to increase fluids and fruits along with regular exercise.  If problem persists please call the office.  Diarrhea:  Kaopectate as directed on the label.  Eat a bland diet and increase fluids.  Avoid highly seasoned foods.  Headache:  Tylenol 1 or 2 tablets every 3-4 hours as needed  Indigestion:  Maalox, Mylanta, Tums or Rolaids- as directed on label.  Also try to eat small meals and avoid fatty, greasy or spicy foods.  Nausea with or without Vomiting:  Nausea in pregnancy is caused by increased levels of hormones in the body which influence the digestive system and cause irritation when stomach acids accumulate.  Symptoms usually subside after 1st trimester of pregnancy.  Try the following:  Keep saltines, graham crackers or dry toast by your bed  to eat upon awakening.  Don't let your stomach get empty.  Try to eat 5-6 small meals per day instead of 3 large ones.  Avoid greasy fatty or highly seasoned foods.   Take OTC Unisom 1 tablet at bed time along with OTC Vitamin B6 25-50 mg 3 times per day.    If nausea continues with vomiting and you are unable to keep down food and fluids you may need a prescription medication.  Please notify your provider.   Sore throat:  Chloraseptic spray, throat lozenges and or plain Tylenol.  Vaginal Yeast Infection:  OTC Monistat for 7 days as directed on label.  If symptoms do not resolve within a week notify provider.  If any of the above problems do not subside with recommended treatment please call the office for further assistance.   Do not take Aspirin, Advil, Motrin or Ibuprofen.  * * OTC= Over the counter Hyperemesis Gravidarum Hyperemesis gravidarum is a severe form of nausea and vomiting that happens during pregnancy. Hyperemesis is worse than morning sickness. It may cause you to have nausea or vomiting all day for many days. It may keep you from eating and drinking enough food and liquids. Hyperemesis usually occurs during the first half (the first 20 weeks) of pregnancy. It often goes away once a woman is in her second half of pregnancy. However, sometimes hyperemesis continues through an entire pregnancy.  CAUSES  The cause of this condition is not completely known but is thought to be related to changes in the body's hormones when pregnant. It could be from the high level of the pregnancy hormone or an increase in estrogen in the body.  SIGNS AND SYMPTOMS   Severe nausea and vomiting.  Nausea that does not go away.  Vomiting that does not allow you to keep any food down.  Weight loss and body fluid loss (dehydration).  Having no desire to eat or not liking food you have previously enjoyed. DIAGNOSIS  Your health care provider will do a physical exam and ask you about your symptoms.  He or she may also order blood tests and urine tests to make sure something else is not causing the problem.  TREATMENT  You may only need medicine to control the problem. If medicines do not control the nausea and vomiting, you will be treated in the hospital to prevent dehydration, increased acid in the blood (acidosis), weight loss, and changes in the electrolytes in your body that may harm the unborn baby (fetus). You may need IV fluids.  HOME CARE INSTRUCTIONS   Only take over-the-counter or prescription medicines as directed by your health care provider.  Try eating a couple of dry crackers or   toast in the morning before getting out of bed.  Avoid foods and smells that upset your stomach.  Avoid fatty and spicy foods.  Eat 5-6 small meals a day.  Do not drink when eating meals. Drink between meals.  For snacks, eat high-protein foods, such as cheese.  Eat or suck on things that have ginger in them. Ginger helps nausea.  Avoid food preparation. The smell of food can spoil your appetite.  Avoid iron pills and iron in your multivitamins until after 3-4 months of being pregnant. However, consult with your health care provider before stopping any prescribed iron pills. SEEK MEDICAL CARE IF:   Your abdominal pain increases.  You have a severe headache.  You have vision problems.  You are losing weight. SEEK IMMEDIATE MEDICAL CARE IF:   You are unable to keep fluids down.  You vomit blood.  You have constant nausea and vomiting.  You have excessive weakness.  You have extreme thirst.  You have dizziness or fainting.  You have a fever or persistent symptoms for more than 2-3 days.  You have a fever and your symptoms suddenly get worse. MAKE SURE YOU:   Understand these instructions.  Will watch your condition.  Will get help right away if you are not doing well or get worse.   This information is not intended to replace advice given to you by your health care  provider. Make sure you discuss any questions you have with your health care provider.   Document Released: 12/24/2004 Document Revised: 10/14/2012 Document Reviewed: 08/05/2012 Elsevier Interactive Patient Education 2016 [employer]. Commonly Asked Questions During Pregnancy  Cats: A parasite can be excreted in cat feces.  To avoid exposure you need to have another person empty the little box.  If you must empty the litter box you will need to wear gloves.  Wash your hands after handling your cat.  This parasite can also be found in raw or undercooked meat so this should also be avoided.  Colds, Sore Throats, Flu: Please check your medication sheet to see what you can take for symptoms.  If your symptoms are unrelieved by these medications please call the office.  Dental Work: Most any dental work your dentist recommends is permitted.  X-rays should only be taken during the first trimester if absolutely necessary.  Your abdomen should be shielded with a lead apron during all x-rays.  Please notify your provider prior to receiving any x-rays.  Novocaine is fine; gas is not recommended.  If your dentist requires a note from us prior to dental work please call the office and we will provide one for you.  Exercise: Exercise is an important part of staying healthy during your pregnancy.  You may continue most exercises you were accustomed to prior to pregnancy.  Later in your pregnancy you will most likely notice you have difficulty with activities requiring balance like riding a bicycle.  It is important that you listen to your body and avoid activities that put you at a higher risk of falling.  Adequate rest and staying well hydrated are a must!  If you have questions about the safety of specific activities ask your provider.    Exposure to Children with illness: Try to avoid obvious exposure; report any symptoms to us when noted,  If you have chicken pos, red measles or mumps, you should be immune to  these diseases.   Please do not take any vaccines while pregnant unless you have checked with   your OB provider.  Fetal Movement: After 28 weeks we recommend you do "kick counts" twice daily.  Lie or sit down in a calm quiet environment and count your baby movements "kicks".  You should feel your baby at least 10 times per hour.  If you have not felt 10 kicks within the first hour get up, walk around and have something sweet to eat or drink then repeat for an additional hour.  If count remains less than 10 per hour notify your provider.  Fumigating: Follow your pest control agent's advice as to how long to stay out of your home.  Ventilate the area well before re-entering.  Hemorrhoids:   Most over-the-counter preparations can be used during pregnancy.  Check your medication to see what is safe to use.  It is important to use a stool softener or fiber in your diet and to drink lots of liquids.  If hemorrhoids seem to be getting worse please call the office.   Hot Tubs:  Hot tubs Jacuzzis and saunas are not recommended while pregnant.  These increase your internal body temperature and should be avoided.  Intercourse:  Sexual intercourse is safe during pregnancy as long as you are comfortable, unless otherwise advised by your provider.  Spotting may occur after intercourse; report any bright red bleeding that is heavier than spotting.  Labor:  If you know that you are in labor, please go to the hospital.  If you are unsure, please call the office and let us help you decide what to do.  Lifting, straining, etc:  If your job requires heavy lifting or straining please check with your provider for any limitations.  Generally, you should not lift items heavier than that you can lift simply with your hands and arms (no back muscles)  Painting:  Paint fumes do not harm your pregnancy, but may make you ill and should be avoided if possible.  Latex or water based paints have less odor than oils.  Use adequate  ventilation while painting.  Permanents & Hair Color:  Chemicals in hair dyes are not recommended as they cause increase hair dryness which can increase hair loss during pregnancy.  " Highlighting" and permanents are allowed.  Dye may be absorbed differently and permanents may not hold as well during pregnancy.  Sunbathing:  Use a sunscreen, as skin burns easily during pregnancy.  Drink plenty of fluids; avoid over heating.  Tanning Beds:  Because their possible side effects are still unknown, tanning beds are not recommended.  Ultrasound Scans:  Routine ultrasounds are performed at approximately 20 weeks.  You will be able to see your baby's general anatomy an if you would like to know the gender this can usually be determined as well.  If it is questionable when you conceived you may also receive an ultrasound early in your pregnancy for dating purposes.  Otherwise ultrasound exams are not routinely performed unless there is a medical necessity.  Although you can request a scan we ask that you pay for it when conducted because insurance does not cover " patient request" scans.  Work: If your pregnancy proceeds without complications you may work until your due date, unless your physician or employer advises otherwise.  Round Ligament Pain/Pelvic Discomfort:  Sharp, shooting pains not associated with bleeding are fairly common, usually occurring in the second trimester of pregnancy.  They tend to be worse when standing up or when you remain standing for long periods of time.  These are the result   of pressure of certain pelvic ligaments called "round ligaments".  Rest, Tylenol and heat seem to be the most effective relief.  As the womb and fetus grow, they rise out of the pelvis and the discomfort improves.  Please notify the office if your pain seems different than that described.  It may represent a more serious condition.   

## 2015-04-03 NOTE — Progress Notes (Signed)
   Oliviana A [name] presents for NOB nurse interview visit. [facility] G-[facility].  P-1001. Pregnancy education material explained and given. No cats in the home. NOB labs ordered TSH/HbgA1c due to Increased BMI.  HIV labs and Drug screen were explained optional and she could opt out of tests but did not decline. Drug screen ordered. PNV encouraged. NT to discuss with provider. Ultrasound ordered due to irregular menses,LMP 02/10/2015 was early.  Pt was using the nuvaring. Pt also states she had placental abruption after delivery. Has history of frequent UTI's. Pt c/o lower back pain and urinary frequency. She states the frequency started about 3 wks ago and the last week back pain and urinary frequency. UA done in office-small leuk's. UC sent. Pt to contact office if symptoms increase.  Pt. To follow up with provider in 5 weeks for NOB physical.  All questions answered.  ZIKA EXPOSURE SCREEN:  The patient has not traveled to a [place] endemic area within the past 6 months, nor has she had unprotected sex with a partner who has travelled to a [place] endemic region within the past 6 months. The patient has been advised to notify us if these factors change any time during this current pregnancy, so adequate testing and monitoring can be initiated.

## 2015-04-05 LAB — CBC WITH DIFFERENTIAL/PLATELET
Basophils Absolute: 0 10*3/uL (ref 0.0–0.2)
Basos: 0 %
EOS (ABSOLUTE): 0.1 10*3/uL (ref 0.0–0.4)
EOS: 1 %
HEMATOCRIT: 44 % (ref 34.0–46.6)
Hemoglobin: 15.1 g/dL (ref 11.1–15.9)
IMMATURE GRANULOCYTES: 0 %
Immature Grans (Abs): 0 10*3/uL (ref 0.0–0.1)
LYMPHS ABS: 1.4 10*3/uL (ref 0.7–3.1)
Lymphs: 19 %
MCH: 31.7 pg (ref 26.6–33.0)
MCHC: 34.3 g/dL (ref 31.5–35.7)
MCV: 92 fL (ref 79–97)
Monocytes Absolute: 0.7 10*3/uL (ref 0.1–0.9)
Monocytes: 10 %
NEUTROS PCT: 70 %
Neutrophils Absolute: 5 10*3/uL (ref 1.4–7.0)
Platelets: 229 10*3/uL (ref 150–379)
RBC: 4.77 x10E6/uL (ref 3.77–5.28)
RDW: 12.9 % (ref 12.3–15.4)
WBC: 7.2 10*3/uL (ref 3.4–10.8)

## 2015-04-05 LAB — TSH: TSH: 1.43 u[IU]/mL (ref 0.450–4.500)

## 2015-04-05 LAB — HEMOGLOBIN A1C
Est. average glucose Bld gHb Est-mCnc: 100 mg/dL
HEMOGLOBIN A1C: 5.1 % (ref 4.8–5.6)

## 2015-04-05 LAB — ANTIBODY SCREEN: ANTIBODY SCREEN: NEGATIVE

## 2015-04-05 LAB — RPR: RPR: NONREACTIVE

## 2015-04-05 LAB — GC/CHLAMYDIA PROBE AMP
CHLAMYDIA, DNA PROBE: NEGATIVE
Neisseria gonorrhoeae by PCR: NEGATIVE

## 2015-04-05 LAB — PAIN MGT SCRN (14 DRUGS), UR
AMPHETAMINE SCRN UR: NEGATIVE ng/mL
BARBITURATE SCRN UR: NEGATIVE ng/mL
Benzodiazepine Screen, Urine: NEGATIVE ng/mL
Buprenorphine, Urine: NEGATIVE ng/mL
CANNABINOIDS UR QL SCN: POSITIVE ng/mL
Cocaine(Metab.)Screen, Urine: NEGATIVE ng/mL
Creatinine(Crt), U: 76.4 mg/dL (ref 20.0–300.0)
Fentanyl, Urine: NEGATIVE pg/mL
METHADONE SCREEN, URINE: NEGATIVE ng/mL
Meperidine Screen, Urine: NEGATIVE ng/mL
Opiate Scrn, Ur: NEGATIVE ng/mL
Oxycodone+Oxymorphone Ur Ql Scn: NEGATIVE ng/mL
PCP SCRN UR: NEGATIVE ng/mL
PH UR, DRUG SCRN: 7.6 (ref 4.5–8.9)
Propoxyphene, Screen: NEGATIVE ng/mL
TRAMADOL UR QL SCN: NEGATIVE ng/mL

## 2015-04-05 LAB — URINE CULTURE, OB REFLEX

## 2015-04-05 LAB — HEPATITIS B SURFACE ANTIGEN: HEP B S AG: NEGATIVE

## 2015-04-05 LAB — HIV ANTIBODY (ROUTINE TESTING W REFLEX): HIV Screen 4th Generation wRfx: NONREACTIVE

## 2015-04-05 LAB — NICOTINE SCREEN, URINE: COTININE UR QL SCN: NEGATIVE ng/mL

## 2015-04-05 LAB — VARICELLA ZOSTER ANTIBODY, IGM

## 2015-04-05 LAB — RH TYPE: Rh Factor: POSITIVE

## 2015-04-05 LAB — RUBELLA ANTIBODY, IGM: Rubella IgM: <20 AU/mL (ref 0.0–19.9)

## 2015-04-05 LAB — ABO

## 2015-04-05 LAB — CULTURE, OB URINE

## 2015-04-07 ENCOUNTER — Ambulatory Visit (INDEPENDENT_AMBULATORY_CARE_PROVIDER_SITE_OTHER): Payer: Medicaid Other

## 2015-04-07 DIAGNOSIS — N926 Irregular menstruation, unspecified: Secondary | ICD-10-CM

## 2015-04-07 DIAGNOSIS — Z3490 Encounter for supervision of normal pregnancy, unspecified, unspecified trimester: Secondary | ICD-10-CM

## 2015-04-07 DIAGNOSIS — Z331 Pregnant state, incidental: Secondary | ICD-10-CM

## 2015-04-07 DIAGNOSIS — Z369 Encounter for antenatal screening, unspecified: Secondary | ICD-10-CM

## 2015-04-07 DIAGNOSIS — Z3687 Encounter for antenatal screening for uncertain dates: Secondary | ICD-10-CM

## 2015-04-07 DIAGNOSIS — Z36 Encounter for antenatal screening of mother: Secondary | ICD-10-CM | POA: Diagnosis not present

## 2015-04-08 ENCOUNTER — Emergency Department
Admission: EM | Admit: 2015-04-08 | Discharge: 2015-04-08 | Disposition: A | Discharge status: Home / Self Care | Payer: Medicaid Other | Attending: Emergency Medicine | Admitting: Emergency Medicine

## 2015-04-08 ENCOUNTER — Emergency Department: Payer: Medicaid Other

## 2015-04-08 DIAGNOSIS — Z880 Allergy status to penicillin: Secondary | ICD-10-CM | POA: Diagnosis not present

## 2015-04-08 DIAGNOSIS — O9989 Other specified diseases and conditions complicating pregnancy, childbirth and the puerperium: Secondary | ICD-10-CM | POA: Diagnosis present

## 2015-04-08 DIAGNOSIS — Z3A01 Less than 8 weeks gestation of pregnancy: Secondary | ICD-10-CM | POA: Insufficient documentation

## 2015-04-08 DIAGNOSIS — Z79899 Other long term (current) drug therapy: Secondary | ICD-10-CM | POA: Insufficient documentation

## 2015-04-08 DIAGNOSIS — O99331 Smoking (tobacco) complicating pregnancy, first trimester: Secondary | ICD-10-CM | POA: Insufficient documentation

## 2015-04-08 DIAGNOSIS — O99351 Diseases of the nervous system complicating pregnancy, first trimester: Secondary | ICD-10-CM | POA: Insufficient documentation

## 2015-04-08 DIAGNOSIS — G43409 Hemiplegic migraine, not intractable, without status migrainosus: Secondary | ICD-10-CM | POA: Diagnosis not present

## 2015-04-08 DIAGNOSIS — F17210 Nicotine dependence, cigarettes, uncomplicated: Secondary | ICD-10-CM | POA: Diagnosis not present

## 2015-04-08 LAB — COMPREHENSIVE METABOLIC PANEL
ALBUMIN: 4.5 g/dL (ref 3.5–5.0)
ALK PHOS: 50 U/L (ref 38–126)
ALT: 17 U/L (ref 14–54)
ANION GAP: 9 (ref 5–15)
AST: 23 U/L (ref 15–41)
BUN: 8 mg/dL (ref 6–20)
CALCIUM: 9.3 mg/dL (ref 8.9–10.3)
CO2: 20 mmol/L — AB (ref 22–32)
Chloride: 106 mmol/L (ref 101–111)
Creatinine, Ser: 0.63 mg/dL (ref 0.44–1.00)
GFR calc non Af Amer: >60 mL/min (ref >60)
Glucose, Bld: 105 mg/dL — ABNORMAL HIGH (ref 65–99)
Potassium: 3.3 mmol/L — ABNORMAL LOW (ref 3.5–5.1)
Sodium: 135 mmol/L (ref 135–145)
Total Bilirubin: 0.7 mg/dL (ref 0.3–1.2)
Total Protein: 7.5 g/dL (ref 6.5–8.1)

## 2015-04-08 LAB — PROTIME-INR
INR: 1.03
Prothrombin Time: 13.7 seconds (ref 11.4–15.0)

## 2015-04-08 LAB — CBC
HEMATOCRIT: 42.7 % (ref 35.0–47.0)
HEMOGLOBIN: 14.8 g/dL (ref 12.0–16.0)
MCH: 31.4 pg (ref 26.0–34.0)
MCHC: 34.7 g/dL (ref 32.0–36.0)
MCV: 90.3 fL (ref 80.0–100.0)
Platelets: 226 10*3/uL (ref 150–440)
RBC: 4.73 MIL/uL (ref 3.80–5.20)
RDW: 12.5 % (ref 11.5–14.5)
WBC: 11.4 10*3/uL — ABNORMAL HIGH (ref 3.6–11.0)

## 2015-04-08 LAB — DIFFERENTIAL
BASOS ABS: 0 10*3/uL (ref 0–0.1)
Basophils Relative: 0 %
EOS ABS: 0 10*3/uL (ref 0–0.7)
Eosinophils Relative: 0 %
LYMPHS ABS: 1.6 10*3/uL (ref 1.0–3.6)
LYMPHS PCT: 14 %
MONOS PCT: 7 %
Monocytes Absolute: 0.8 10*3/uL (ref 0.2–0.9)
NEUTROS ABS: 8.9 10*3/uL — AB (ref 1.4–6.5)
NEUTROS PCT: 79 %

## 2015-04-08 LAB — GLUCOSE, CAPILLARY: GLUCOSE-CAPILLARY: 101 mg/dL — AB (ref 65–99)

## 2015-04-08 LAB — APTT: aPTT: 28 seconds (ref 24–36)

## 2015-04-08 LAB — TROPONIN I: Troponin I: <0.03 ng/mL (ref <0.031)

## 2015-04-08 MED ORDER — ONDANSETRON HCL 4 MG/2ML IJ SOLN
4.0000 mg | Freq: Once | INTRAMUSCULAR | Status: AC
  Administered 2015-04-08: 4 mg via INTRAVENOUS

## 2015-04-08 MED ORDER — PROMETHAZINE HCL 12.5 MG PO TABS: 12.5000 mg | ORAL_TABLET | Freq: Four times a day (QID) | ORAL | Status: DC | PRN

## 2015-04-08 MED ORDER — ACETAMINOPHEN 500 MG PO TABS
ORAL_TABLET | ORAL | Status: AC
  Filled 2015-04-08: qty 2

## 2015-04-08 MED ORDER — BUTALBITAL-APAP-CAFFEINE 50-325-40 MG PO TABS: 1.0000 | ORAL_TABLET | Freq: Four times a day (QID) | ORAL | Status: DC | PRN

## 2015-04-08 MED ORDER — SODIUM CHLORIDE 0.9 % IV BOLUS (SEPSIS)
500.0000 mL | Freq: Once | INTRAVENOUS | Status: AC
  Administered 2015-04-08: 500 mL via INTRAVENOUS

## 2015-04-08 MED ORDER — BUTALBITAL-APAP-CAFFEINE 50-325-40 MG PO TABS
2.0000 | ORAL_TABLET | Freq: Once | ORAL | Status: AC
  Administered 2015-04-08: 2 via ORAL
  Filled 2015-04-08: qty 2

## 2015-04-08 MED ORDER — METOCLOPRAMIDE HCL 5 MG/ML IJ SOLN
10.0000 mg | Freq: Once | INTRAMUSCULAR | Status: AC
  Administered 2015-04-08: 10 mg via INTRAVENOUS
  Filled 2015-04-08: qty 2

## 2015-04-08 MED ORDER — ONDANSETRON HCL 4 MG/2ML IJ SOLN
INTRAMUSCULAR | Status: AC
  Filled 2015-04-08: qty 2

## 2015-04-08 MED ORDER — ACETAMINOPHEN 500 MG PO TABS
1000.0000 mg | ORAL_TABLET | Freq: Once | ORAL | Status: AC
  Administered 2015-04-08: 1000 mg via ORAL

## 2015-04-08 MED ORDER — PROMETHAZINE HCL 12.5 MG RE SUPP: 12.5000 mg | Freq: Four times a day (QID) | RECTAL | Status: DC | PRN

## 2015-04-08 MED ORDER — PROCHLORPERAZINE EDISYLATE 5 MG/ML IJ SOLN
10.0000 mg | Freq: Once | INTRAMUSCULAR | Status: AC
  Administered 2015-04-08: 10 mg via INTRAVENOUS
  Filled 2015-04-08: qty 2

## 2015-04-08 MED ORDER — HYDROMORPHONE HCL 1 MG/ML IJ SOLN
0.5000 mg | Freq: Once | INTRAMUSCULAR | Status: AC
  Administered 2015-04-08: 0.5 mg via INTRAVENOUS
  Filled 2015-04-08: qty 1

## 2015-04-08 NOTE — ED Notes (Signed)
Pt reports history of migraine with aura with nausea and vomting. Migraine started today at 1230 then numbness and weakness to right arm and leg started at about 1245. Pt also [redacted] weeks pregnant right right arm drift and grip weakness

## 2015-04-08 NOTE — ED Provider Notes (Signed)
Time Seen: Approximately 1500  I have reviewed the triage notes  Chief Complaint: Numbness and Migraine   History of Present Illness: Rose [name] is a 25 y.o. female who presents with headache that started today at approximately 12:30 this afternoon while the patient was driving. She states that she is [redacted] weeks pregnant and has a history of complicated migraines in the last time she had these similar symptoms she was [redacted] weeks pregnant. Eyes any history of hypertension or diabetes with her previous pregnancy. She states no neck pain but does have some photophobia and INR with nausea and vomiting. Patient was initiated in stroke protocol based on her presentation in triage area. Past Medical History  Diagnosis Date  . Asthma   . Migraine   . Hx of chlamydia infection   . History of placental abruption     after delivery  . Personal history UTI     Patient Active Problem List   Diagnosis Date Noted  . Left arm numbness   . Acute appendicitis 02/03/2012  . Chlamydia 01/10/2012    Past Surgical History  Procedure Laterality Date  . Eye surgery    . Tympanostomy tube placement    . Lymph node biopsy      Left neck after cat scratch fever  . Laparoscopic appendectomy  02/03/2012    Procedure: APPENDECTOMY LAPAROSCOPIC;  Surgeon: Wilmon Arms. Corliss Skains, MD;  Location: [facility] OR;  Service: General;  Laterality: N/A;    Past Surgical History  Procedure Laterality Date  . Eye surgery    . Tympanostomy tube placement    . Lymph node biopsy      Left neck after cat scratch fever  . Laparoscopic appendectomy  02/03/2012    Procedure: APPENDECTOMY LAPAROSCOPIC;  Surgeon: Wilmon Arms. Corliss Skains, MD;  Location: [facility] OR;  Service: General;  Laterality: N/A;    Current Outpatient Rx  Name  Route  Sig  Dispense  Refill  . Prenatal Vit-Fe Fumarate-FA (PRENATAL MULTIVITAMIN) TABS tablet   Oral   Take 1 tablet by mouth daily at 12 noon.         . butalbital-acetaminophen-caffeine (FIORICET)  50-325-40 MG tablet   Oral   Take 1-2 tablets by mouth every 6 (six) hours as needed for headache.   20 tablet   0   . promethazine (PHENERGAN) 12.5 MG suppository   Rectal   Place 1 suppository (12.5 mg total) rectally every 6 (six) hours as needed for nausea or vomiting.   12 each   0   . promethazine (PHENERGAN) 12.5 MG tablet   Oral   Take 1 tablet (12.5 mg total) by mouth every 6 (six) hours as needed for nausea or vomiting.   30 tablet   0     Allergies:  Penicillins  Family History: Family History  Problem Relation Age of Onset  . Lung cancer Maternal Grandmother   . COPD Mother   . Asthma Mother     Social History: Social History  Substance Use Topics  . Smoking status: Current Every Day Smoker    Types: Cigarettes  . Smokeless tobacco: Never Used     Comment: One pack per week.  . Alcohol Use: No     Comment: Occasion use - socially     Review of Systems:   10 point review of systems was performed and was otherwise negative:  Constitutional: No fever Eyes: Photophobia without any visual field deficits ENT: No sore throat, ear pain Cardiac: No chest  pain Respiratory: No shortness of breath, wheezing, or stridor Abdomen: No abdominal pain, no vomiting, No diarrhea Endocrine: No weight loss, No night sweats Extremities: No peripheral edema, cyanosis Skin: No rashes, easy bruising Neurologic: Patient describes numbness over the right side of her body without any focal weakness. No difficulty with speech or swallowing Urologic: No dysuria, Hematuria, or urinary frequency *Patient denies any vaginal discharge or bleeding  Physical Exam:  ED Triage Vitals  Enc Vitals Group     BP 04/08/15 1451 127/86 mmHg     Pulse Rate 04/08/15 1451 59     Resp 04/08/15 1451 20     Temp 04/08/15 1451 97.1 F (36.2 C)     Temp Source 04/08/15 1451 Oral     SpO2 04/08/15 1451 97 %     Weight 04/08/15 1451 219 lb (99.338 kg)     Height 04/08/15 1451  (1.702  m)     Head Cir --      Peak Flow --      Pain Score 04/08/15 1906 5     Pain Loc --      Pain Edu? --      Excl. in GC? --     General: Awake , Alert , and Oriented times 3; GCS 15 patient actively vomiting at this time but no hematemesis or biliary emesis Head: Normal cephalic , atraumatic Eyes: Pupils equal , round, reactive to light Nose/Throat: No nasal drainage, patent upper airway without erythema or exudate.  Neck: Supple, Full range of motion, No anterior adenopathy or palpable thyroid masses Lungs: Clear to ascultation without wheezes , rhonchi, or rales Heart: Regular rate, regular rhythm without murmurs , gallops , or rubs Abdomen: Soft, non tender without rebound, guarding , or rigidity; bowel sounds positive and symmetric in all 4 quadrants. No organomegaly .        Extremities: 2 plus symmetric pulses. No edema, clubbing or cyanosis Neurologic: normal ambulation, Motor symmetric without deficits, sensory intact Skin: warm, dry, no rashes   Labs:   All laboratory work was reviewed including any pertinent negatives or positives listed below:  Labs Reviewed  CBC - Abnormal; Notable for the following:    WBC 11.4 (*)    All other components within normal limits  DIFFERENTIAL - Abnormal; Notable for the following:    Neutro Abs 8.9 (*)    All other components within normal limits  COMPREHENSIVE METABOLIC PANEL - Abnormal; Notable for the following:    Potassium 3.3 (*)    CO2 20 (*)    Glucose, Bld 105 (*)    All other components within normal limits  GLUCOSE, CAPILLARY - Abnormal; Notable for the following:    Glucose-Capillary 101 (*)    All other components within normal limits  PROTIME-INR  APTT  TROPONIN I  CBG MONITORING, ED  Laboratory work was reviewed and showed no clinically significant abnormalities.   Radiology   CLINICAL DATA: Migraine with aura, nausea, and vomiting beginning at 1230 hours today, developed numbness and weakness RIGHT arm  and leg at 1245 hours, RIGHT arm drift, grip weakness, [redacted] weeks pregnant  EXAM: CT HEAD WITHOUT CONTRAST  TECHNIQUE: Contiguous axial images were obtained from the base of the skull through the vertex without intravenous contrast.  COMPARISON: None  FINDINGS: Normal ventricular morphology.  No midline shift or mass effect.  Normal appearance of brain parenchyma.  No intracranial hemorrhage, mass lesion, or acute infarction.  Visualized paranasal sinuses and mastoid air cells  clear.  Bones unremarkable.  IMPRESSION: Normal exam.  Findings called to Dr. Darnelle CatalanMalinda on 04/08/2015 at 1517 hours.    I personally reviewed the radiologic studies    ED Course: [facility] Patient had received her CT prior to my evaluation. Neurologist agrees that this is most likely a presentation for complicated migraines especially based on the patient's previous history. Patient had some gradual symptomatic improvement. She was given first Zofran and Dilaudid but still had persistent pain and vomiting. She was then eventually given Compazine and 2 Darvocet which didn't relieve her headache. She had previously been on Topamax which was stopped by her primary neurologist when she became pregnant. She was advised that most of the medications we would use for symptomatic treatment heart type C drugs and overall are considered safe in pregnancy. She's been advised to stick to Tylenol as much as possible as is the only type A drug. She was advised continue drinking plenty of fluids and return here if she has any focal neurologic deficits, neck pain, fever, trouble with speech or swallowing or any other new concerns. She was advised also to follow up with her OB/GYN   Assessment: * Complicated migraine First trimester pregnancy   Final Clinical Impression:   Final diagnoses:  Hemiplegic migraine without status migrainosus, not intractable     Plan:  Outpatient  management Patient was advised to return immediately if condition worsens. Patient was advised to follow up with their primary care physician or other specialized physicians involved in their outpatient care. The patient and/or family member/power of attorney had laboratory results reviewed at the bedside. All questions and concerns were addressed and appropriate discharge instructions were distributed by the nursing staff.             Jennye MoccasinBrian S Cathyann Kilfoyle, MD 04/08/15 2118

## 2015-04-08 NOTE — Discharge Instructions (Signed)

## 2015-04-08 NOTE — ED Notes (Signed)
Pt actively vomiting in room. EDP made aware, see MAR

## 2015-05-04 ENCOUNTER — Telehealth: Payer: Self-pay | Admitting: Obstetrics and Gynecology

## 2015-05-04 ENCOUNTER — Encounter: Payer: Medicaid Other | Admitting: Obstetrics and Gynecology

## 2015-05-04 NOTE — Telephone Encounter (Signed)
10 wk 2days / pt has had a migraine headache for 1 week / dizzy and it won't go away. She has not seen a dr yet only ob intake visit. Tylenol not doing anything

## 2015-05-05 ENCOUNTER — Encounter: Payer: Self-pay | Admitting: Obstetrics and Gynecology

## 2015-05-05 MED ORDER — BUTALBITAL-APAP-CAFFEINE 50-325-40 MG PO TABS: 1.0000 | ORAL_TABLET | Freq: Four times a day (QID) | ORAL | Status: DC | PRN

## 2015-05-05 NOTE — Telephone Encounter (Signed)
Called pt and she states that she has been taking Fioricet also but only one maybe a day. Dr. Valentino Saxonherry ordered Fioricet. Advised pt to take 2 tabs every 6 hrs as needed. If she continues to need Fioricet after a couple days will need to see provider. Also that dizziness could be related decreased fluid intake, and food intake. To increase fluid intake and eat 6 small meals a day. Pt voiced understanding.

## 2015-05-18 ENCOUNTER — Ambulatory Visit (INDEPENDENT_AMBULATORY_CARE_PROVIDER_SITE_OTHER): Payer: Medicaid Other | Admitting: Obstetrics and Gynecology

## 2015-05-18 ENCOUNTER — Encounter: Payer: Self-pay | Admitting: Obstetrics and Gynecology

## 2015-05-18 VITALS — BP 100/65 | HR 69 | Wt 212.6 lb

## 2015-05-18 DIAGNOSIS — Z2839 Other underimmunization status: Secondary | ICD-10-CM

## 2015-05-18 DIAGNOSIS — O9989 Other specified diseases and conditions complicating pregnancy, childbirth and the puerperium: Secondary | ICD-10-CM

## 2015-05-18 DIAGNOSIS — Z720 Tobacco use: Secondary | ICD-10-CM

## 2015-05-18 DIAGNOSIS — Z3482 Encounter for supervision of other normal pregnancy, second trimester: Secondary | ICD-10-CM

## 2015-05-18 DIAGNOSIS — E669 Obesity, unspecified: Secondary | ICD-10-CM

## 2015-05-18 DIAGNOSIS — G43109 Migraine with aura, not intractable, without status migrainosus: Secondary | ICD-10-CM

## 2015-05-18 DIAGNOSIS — Z789 Other specified health status: Secondary | ICD-10-CM

## 2015-05-18 DIAGNOSIS — O09899 Supervision of other high risk pregnancies, unspecified trimester: Secondary | ICD-10-CM

## 2015-05-18 DIAGNOSIS — Z283 Underimmunization status: Secondary | ICD-10-CM

## 2015-05-18 DIAGNOSIS — Z3491 Encounter for supervision of normal pregnancy, unspecified, first trimester: Secondary | ICD-10-CM

## 2015-05-18 DIAGNOSIS — R634 Abnormal weight loss: Secondary | ICD-10-CM

## 2015-05-18 DIAGNOSIS — O09291 Supervision of pregnancy with other poor reproductive or obstetric history, first trimester: Secondary | ICD-10-CM

## 2015-05-18 LAB — POCT URINALYSIS DIPSTICK
Bilirubin, UA: NEGATIVE
GLUCOSE UA: NEGATIVE
Ketones, UA: NEGATIVE
Leukocytes, UA: NEGATIVE
NITRITE UA: NEGATIVE
PH UA: 7
Protein, UA: NEGATIVE
Spec Grav, UA: 1.01
UROBILINOGEN UA: NEGATIVE

## 2015-05-18 NOTE — Progress Notes (Signed)
OBSTETRIC INITIAL PRENATAL VISIT  Subjective:    Rose [name] is being seen today for her first obstetrical visit.  This is not a planned pregnancy. She is a 25 y.o. G2P1001 female at [redacted]w[redacted]d gestation, Estimated Date of Delivery: 11/29/15 by 6 week ultrasound, not consistent with patient's last menstrual period of 02/10/2015 (approximate).  Her obstetrical history is significant for asthma, migraines with aura, and postpartum hemorrhage in prior pregnancy. Relationship with FOB: significant other, living together. Patient does intend to breast feed. Pregnancy history fully reviewed.    Obstetric History   G2   P1   T1   P0   A0   TAB0   SAB0   E0   M0   L1     # Outcome Date GA Lbr Len/2nd Weight Sex Delivery Anes PTL Lv  2 Current           1 Term 2011 [redacted]w[redacted]d   M Vag-Spont None N Y    Obstetric Comments  Postpartum hemorrhage after 1st delivery, did not require blood transfusion.     Gynecologic History:  Last pap smear was 06/2014.  Results were normal.  Denies h/o abnormal pap smears in the past.  Denies/admits history of STIs.    Past Medical History  Diagnosis Date  . Asthma   . Hx of chlamydia infection     +12/22/11, TOC neg   . History of postpartum hemorrhage   . Personal history UTI   . 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   . IBD (inflammatory bowel disease)     Chron's disease    Family History  Problem Relation Age of Onset  . Lung cancer Maternal Grandmother   . COPD Mother   . Asthma Mother     Past Surgical History  Procedure Laterality Date  . Eye surgery    . Tympanostomy tube placement    . Lymph node biopsy      Left neck after cat scratch fever  . Laparoscopic appendectomy  02/03/2012    Procedure: APPENDECTOMY LAPAROSCOPIC;  Surgeon: Wilmon Arms. Corliss Skains, MD;  Location: [facility] OR;  Service: General;  Laterality: N/A;  . Appendectomy  2013    acute appendicitis    Social History   Social  History  . Marital Status: Single    Spouse Name: N/A  . Number of Children: 1  . Years of Education: 14   Occupational History  . 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 Smoking status: Current Every Day Smoker    Types: Cigarettes  . Smokeless tobacco: Never Used     Comment: One pack per week.  . Alcohol Use: No     Comment: Occasion use - socially  . Drug Use: Yes    Special: Marijuana  . Sexual Activity: Yes    Birth Control/ Protection: Pill   Other Topics Concern  . Not on file   Social History Narrative   Lives at home with her son.   Right-handed.   6-8 cups caffeine per day.    Current Outpatient Prescriptions on File Prior to Visit  Medication Sig Dispense Refill  . butalbital-acetaminophen-caffeine (FIORICET) 50-325-40 MG tablet Take 1-2 tablets by mouth every 6 (six) hours as needed for headache. 20 tablet 0  . promethazine (PHENERGAN) 12.5 MG tablet Take 1 tablet (12.5 mg total) by mouth every 6 (six) hours as needed for nausea or vomiting.  30 tablet 0  . [DISCONTINUED] clidinium-chlordiazePOXIDE (LIBRAX) 5-2.5 MG per capsule Take 1 capsule by mouth 3 (three) times daily before meals. (Patient not taking: Reported on 02/16/2014) 60 capsule 3  . [DISCONTINUED] omeprazole (PRILOSEC) 20 MG capsule Take one capsule PO twice a day for 3 days, then one capsule PO once a day. (Patient not taking: Reported on 04/26/2014) 20 capsule 0   No current facility-administered medications on file prior to visit.    Allergies  Allergen Reactions  . Penicillins Hives    Has patient had a PCN reaction causing immediate rash, facial/tongue/throat swelling, SOB or lightheadedness with hypotension: Yes Has patient had a PCN reaction causing severe rash involving mucus membranes or skin necrosis: No Has patient had a PCN reaction that required hospitalization Yes Has patient had a PCN reaction occurring within the last 10 years: No If all of the above answers are "NO",  then may proceed with Cephalosporin use. Has patient had a PCN reaction causing immediate rash, facial/tongue/throat swelling, SOB or lightheadedness with hypotension: Yes Has patient had a PCN reaction causing severe rash involving mucus membranes or skin necrosis: No Has patient had a PCN reaction that required hospitalization Yes Has patient had a PCN reaction occurring within the last 10 years: No If all of the above answers are "NO", then may proceed with Cephalosporin use.     Review of Systems General:Positive for Weight Loss (27 lbs since discovery of pregnancy). Not Present- Fever,  and Weight Gain. Skin:Not Present- Rash. HEENT:Not Present- Blurred Vision, Headache and Bleeding Gums. Respiratory:Not Present- Difficulty Breathing. Breast:Not Present- Breast Mass. Cardiovascular:Not Present- Chest Pain, Elevated Blood Pressure, Fainting / Blacking Out and Shortness of Breath. Gastrointestinal:Present - Nausea (has improved). Not Present- Abdominal Pain, Constipation, and Vomiting. Female Genitourinary:Not Present- Frequency, Painful Urination, Pelvic Pain, Vaginal Bleeding, Vaginal Discharge, Contractions, regular, Fetal Movements Decreased, Urinary Complaints and Vaginal Fluid. Musculoskeletal:Not Present- Back Pain and Leg Cramps. Neurological:Not Present- Dizziness. Psychiatric:Not Present- Depression.     Objective:   Blood pressure 100/65, pulse 69, weight 212 lb 9.6 oz (96.435 kg), last menstrual period 02/10/2015.  Body mass index is 33.29 kg/(m^2).  General Appearance:    Alert, cooperative, no distress, appears stated age, mildly obese  Head:    Normocephalic, without obvious abnormality, atraumatic  Eyes:    PERRL, conjunctiva/corneas clear, EOM's intact, both eyes  Ears:    Normal external ear canals, both ears  Nose:   Nares normal, septum midline, mucosa normal, no drainage or sinus tenderness  Throat:   Lips, mucosa, and tongue normal; teeth and gums  normal  Neck:   Supple, symmetrical, trachea midline, no adenopathy; thyroid: no enlargement/tenderness/nodules; no carotid bruit or JVD  Back:     Symmetric, no curvature, ROM normal, no CVA tenderness  Lungs:     Clear to auscultation bilaterally, respirations unlabored  Chest Wall:    No tenderness or deformity   Heart:    Regular rate and rhythm, S1 and S2 normal, no murmur, rub or gallop  Breast Exam:    No tenderness, masses, or nipple abnormality  Abdomen:     Soft, non-tender, bowel sounds active all four quadrants, no masses, no organomegaly.FHT 158 bpm.  Genitalia:    Pelvic:external genitalia normal, vagina without lesions, discharge, or tenderness, rectovaginal septum  normal. Cervix normal in appearance, no cervical motion tenderness, no adnexal masses or tenderness.  Pregnancy positive findings: uterine enlargement: 12 wk size, nontender.   Rectal:    Normal external sphincter.  No hemorrhoids appreciated. Internal exam not done.   Extremities:   Extremities normal, atraumatic, no cyanosis or edema  Pulses:   2+ and symmetric all extremities  Skin:   Skin color, texture, turgor normal, no rashes or lesions  Lymph nodes:   Cervical, supraclavicular, and axillary nodes normal  Neurologic:   CNII-XII intact, normal strength, sensation and reflexes throughout     Assessment:   Pregnancy at 12 and 1/7 weeks  Obesity (Class I) Weight loss (unintentional), secondary to nausea/vomiting at beginning of pregnancy Tobacco abuse Varicella and Rubella non-immune H/o postpartum hemorrhage in prior pregnancy Migraines with aura  Plan:    Initial labs reviewed. Prenatal vitamins encouraged. Problem list reviewed and updated. Nausea/vomiting has improved.  Only notes nausea, controlled with Phenergan.  Notes appetite has increased.  Encouraged smoking cessation.  Will need Varicella and Rubella vaccines postpartum.  Migraines with aura, patient currently switched to Fioricet during  pregnancy.  New OB counseling:  The patient has been given an overview regarding routine prenatal care.  Recommendations regarding diet, weight gain, and exercise in pregnancy were given. Prenatal testing, optional genetic testing, and ultrasound use in pregnancy were reviewed.  AFP3 discussed: ordered. Will need early glucola for diabetes screening due to obesity.  Benefits of Breast Feeding were discussed. The patient is encouraged to consider nursing her baby post partum. Discussed h/o postpartum hemorrhage, and risks of reoccurrence in current pregnancy.  Follow up in 4 weeks.  50% of 30 min visit spent on counseling and coordination of care.  Medicaid Home Pregnancy form completed today.    Hildred Laser, MD [facility]

## 2015-05-20 ENCOUNTER — Encounter: Payer: Self-pay | Admitting: Obstetrics and Gynecology

## 2015-05-20 DIAGNOSIS — Z720 Tobacco use: Secondary | ICD-10-CM | POA: Insufficient documentation

## 2015-05-20 DIAGNOSIS — E669 Obesity, unspecified: Secondary | ICD-10-CM | POA: Insufficient documentation

## 2015-05-20 DIAGNOSIS — R634 Abnormal weight loss: Secondary | ICD-10-CM | POA: Insufficient documentation

## 2015-05-20 DIAGNOSIS — G43109 Migraine with aura, not intractable, without status migrainosus: Secondary | ICD-10-CM | POA: Insufficient documentation

## 2015-05-20 DIAGNOSIS — O09299 Supervision of pregnancy with other poor reproductive or obstetric history, unspecified trimester: Secondary | ICD-10-CM | POA: Insufficient documentation

## 2015-05-24 ENCOUNTER — Other Ambulatory Visit: Payer: Self-pay | Admitting: Obstetrics and Gynecology

## 2015-05-24 DIAGNOSIS — Z3682 Encounter for antenatal screening for nuchal translucency: Secondary | ICD-10-CM

## 2015-05-25 ENCOUNTER — Other Ambulatory Visit: Payer: Medicaid Other

## 2015-06-06 ENCOUNTER — Encounter: Payer: Self-pay | Admitting: Obstetrics and Gynecology

## 2015-06-07 ENCOUNTER — Telehealth: Payer: Self-pay

## 2015-06-07 NOTE — Telephone Encounter (Signed)
Called pt informed her of negative genetic results. Pt's mother will pick up sex results.

## 2015-06-07 NOTE — Telephone Encounter (Signed)
-----   Message from Hildred LaserAnika Cherry, MD sent at 06/06/2015 10:38 PM EDT ----- Please inform patient of normal Panorama results.

## 2015-06-15 ENCOUNTER — Ambulatory Visit (INDEPENDENT_AMBULATORY_CARE_PROVIDER_SITE_OTHER): Payer: Medicaid Other | Admitting: Obstetrics and Gynecology

## 2015-06-15 VITALS — BP 118/73 | HR 66 | Wt 215.1 lb

## 2015-06-15 DIAGNOSIS — Z131 Encounter for screening for diabetes mellitus: Secondary | ICD-10-CM

## 2015-06-15 DIAGNOSIS — R634 Abnormal weight loss: Secondary | ICD-10-CM

## 2015-06-15 DIAGNOSIS — E669 Obesity, unspecified: Secondary | ICD-10-CM

## 2015-06-15 DIAGNOSIS — Z3482 Encounter for supervision of other normal pregnancy, second trimester: Secondary | ICD-10-CM

## 2015-06-15 DIAGNOSIS — Z3492 Encounter for supervision of normal pregnancy, unspecified, second trimester: Secondary | ICD-10-CM

## 2015-06-15 NOTE — Progress Notes (Signed)
ROB: Patient doing well, no major complaints. Does note decreased appetite. Advised on smoothies/shakes for meal supplementation.  Patient still gaining weight appropriately (although recovering from weight loss in 1st trimester from nausea/vomiting). Normal Panorama screen. RTC in 4 weeks for OB visit and anatomy scan. For early glucola today. Patient unable to void today.

## 2015-06-16 ENCOUNTER — Telehealth: Payer: Self-pay | Admitting: *Deleted

## 2015-06-16 NOTE — Telephone Encounter (Signed)
Called pt no answer. LM for pt informing her of the office closing soon, however if she can make it before closing she can can drop off a urine to be sent for cx. Informed pt that urinalysis performed at her appt this week was clear. Advised pt that if pain is severe she should seek care in nearest ED or urgent care.

## 2015-06-16 NOTE — Telephone Encounter (Signed)
Patient called and states that she is having lower right sided pain, and she thinking she has a UTI. She states the pain is really bad. Patient is wondering if her Urine sample she gave yesterday showed anything. Patient is requesting a call back [phone].

## 2015-06-20 ENCOUNTER — Telehealth: Payer: Self-pay | Admitting: Obstetrics and Gynecology

## 2015-06-20 DIAGNOSIS — G43109 Migraine with aura, not intractable, without status migrainosus: Secondary | ICD-10-CM

## 2015-06-20 MED ORDER — BUTALBITAL-APAP-CAFFEINE 50-325-40 MG PO TABS: 1.0000 | ORAL_TABLET | Freq: Four times a day (QID) | ORAL | Status: DC | PRN

## 2015-06-20 NOTE — Telephone Encounter (Signed)
Patient called requesting a refill on Fioricet sent to the [facility] on [address] in [place].Thanks

## 2015-06-20 NOTE — Telephone Encounter (Signed)
Done. Thanks.

## 2015-07-02 ENCOUNTER — Emergency Department
Admission: EM | Admit: 2015-07-02 | Discharge: 2015-07-02 | Disposition: A | Discharge status: Home / Self Care | Payer: Medicaid Other | Attending: Emergency Medicine | Admitting: Emergency Medicine

## 2015-07-02 ENCOUNTER — Encounter: Payer: Self-pay | Admitting: Emergency Medicine

## 2015-07-02 DIAGNOSIS — F17210 Nicotine dependence, cigarettes, uncomplicated: Secondary | ICD-10-CM | POA: Diagnosis not present

## 2015-07-02 DIAGNOSIS — F1290 Cannabis use, unspecified, uncomplicated: Secondary | ICD-10-CM | POA: Insufficient documentation

## 2015-07-02 DIAGNOSIS — J45909 Unspecified asthma, uncomplicated: Secondary | ICD-10-CM | POA: Insufficient documentation

## 2015-07-02 DIAGNOSIS — O200 Threatened abortion: Secondary | ICD-10-CM | POA: Insufficient documentation

## 2015-07-02 DIAGNOSIS — Z9622 Myringotomy tube(s) status: Secondary | ICD-10-CM | POA: Diagnosis not present

## 2015-07-02 DIAGNOSIS — Z3A18 18 weeks gestation of pregnancy: Secondary | ICD-10-CM | POA: Diagnosis not present

## 2015-07-02 DIAGNOSIS — Z79899 Other long term (current) drug therapy: Secondary | ICD-10-CM | POA: Insufficient documentation

## 2015-07-02 DIAGNOSIS — O4692 Antepartum hemorrhage, unspecified, second trimester: Secondary | ICD-10-CM | POA: Diagnosis present

## 2015-07-02 LAB — URINALYSIS COMPLETE WITH MICROSCOPIC (ARMC ONLY)
BACTERIA UA: NONE SEEN
Bilirubin Urine: NEGATIVE
GLUCOSE, UA: NEGATIVE mg/dL
Hgb urine dipstick: NEGATIVE
Ketones, ur: NEGATIVE mg/dL
Leukocytes, UA: NEGATIVE
Nitrite: NEGATIVE
PROTEIN: NEGATIVE mg/dL
RBC / HPF: NONE SEEN RBC/hpf (ref 0–5)
SPECIFIC GRAVITY, URINE: 1.011 (ref 1.005–1.030)
SQUAMOUS EPITHELIAL / LPF: NONE SEEN
WBC UA: NONE SEEN WBC/hpf (ref 0–5)
pH: 6 (ref 5.0–8.0)

## 2015-07-02 LAB — WET PREP, GENITAL
CLUE CELLS WET PREP: NONE SEEN
SPERM: NONE SEEN
TRICH WET PREP: NONE SEEN
YEAST WET PREP: NONE SEEN

## 2015-07-02 LAB — CHLAMYDIA/NGC RT PCR (ARMC ONLY)
Chlamydia Tr: NOT DETECTED
N gonorrhoeae: NOT DETECTED

## 2015-07-02 LAB — HCG, QUANTITATIVE, PREGNANCY: hCG, Beta Chain, Quant, S: 15971 m[IU]/mL — ABNORMAL HIGH (ref <5)

## 2015-07-02 NOTE — ED Notes (Signed)
Patient to ER for c/o light vaginal bleeding that began last night with slight pelvic discomfort. Patient states she has not felt movement today.

## 2015-07-02 NOTE — ED Notes (Addendum)
States has been stressed - low abd pain intermittent usually when she is up walking. States bleeding "like a period". Currently has no pad on and when she gave urine sample states had only a very small amt.

## 2015-07-02 NOTE — ED Provider Notes (Signed)
[facility] Emergency Department Provider Note   ____________________________________________  Time seen: Approximately 330 PM  I have reviewed the triage vital signs and the nursing notes.   HISTORY  Chief Complaint Vaginal Bleeding   HPI Rose [name] is a 25 y.o. female who is a G2 P1 at 18 weeks and 4 days was presenting today with vaginal bleeding as well as intermittent lower abdominal pain. She has had a confirmatory ultrasound which showed an intrauterine pregnancy at 8 weeks. She says the pain in her lower abdomen only lasts for split-second and seems like a shooting pain when she walks. She says that the bleeding started yesterday as spotting and then progressed to what appeared to be a period bleed this morning. However, she has not had any bleeding since coming to the hospital. He is denying any pain at this time. No nausea or vomiting. No diarrhea. History of chlamydia. Take prenatal vitamins and sees Dr. Valentino Saxonherry for her OB/GYN care.   Past Medical History  Diagnosis Date  . Asthma   . Hx of chlamydia infection     +12/22/11, TOC neg   . History of postpartum hemorrhage   . Personal history UTI   . 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   . IBD (inflammatory bowel disease)     Chron's disease    Patient Active Problem List   Diagnosis Date Noted  . H/O postpartum hemorrhage, currently pregnant 05/20/2015  . Rubella non-immune status, antepartum 05/20/2015  . Maternal varicella, non-immune 05/20/2015  . Tobacco abuse 05/20/2015  . Obesity (BMI 30.0-34.9) 05/20/2015  . Weight loss, unintentional 05/20/2015  . Migraine with aura and without status migrainosus, not intractable 05/20/2015  . Left arm numbness     Past Surgical History  Procedure Laterality Date  . Eye surgery    . Tympanostomy tube placement    . Lymph node biopsy      Left neck after cat scratch fever  .  Laparoscopic appendectomy  02/03/2012    Procedure: APPENDECTOMY LAPAROSCOPIC;  Surgeon: Wilmon ArmsMatthew K. Corliss Skainssuei, MD;  Location: [facility] OR;  Service: General;  Laterality: N/A;  . Appendectomy  2013    acute appendicitis    Current Outpatient Rx  Name  Route  Sig  Dispense  Refill  . acetaminophen (TYLENOL) 325 MG tablet   Oral   Take 650 mg by mouth every 6 (six) hours as needed.         Marland Kitchen. albuterol (PROVENTIL HFA;VENTOLIN HFA) 108 (90 Base) MCG/ACT inhaler   Inhalation   Inhale 2 puffs into the lungs every 6 (six) hours as needed for wheezing or shortness of breath.         . butalbital-acetaminophen-caffeine (FIORICET) 50-325-40 MG tablet   Oral   Take 1-2 tablets by mouth every 6 (six) hours as needed for headache.   20 tablet   3   . Prenatal Vit-Fe Fumarate-FA (PRENATAL VITAMIN) 27-0.8 MG TABS   Oral   Take 1 tablet by mouth every morning.          . promethazine (PHENERGAN) 12.5 MG tablet   Oral   Take 1 tablet (12.5 mg total) by mouth every 6 (six) hours as needed for nausea or vomiting. Patient not taking: Reported on 06/15/2015   30 tablet   0     Allergies Penicillins  Family History  Problem Relation Age of Onset  . Lung cancer Maternal  Grandmother   . COPD Mother   . Asthma Mother     Social History Social History  Substance Use Topics  . Smoking status: Current Every Day Smoker    Types: Cigarettes  . Smokeless tobacco: Never Used     Comment: One pack per week.  . Alcohol Use: No     Comment: Occasion use - socially    Review of Systems Constitutional: No fever/chills Eyes: No visual changes. ENT: No sore throat. Cardiovascular: Denies chest pain. Respiratory: Denies shortness of breath. Gastrointestinal:   No nausea, no vomiting.  No diarrhea.  No constipation. Genitourinary: Negative for dysuria. Musculoskeletal: Negative for back pain. Skin: Negative for rash. Neurological: Negative for headaches, focal weakness or numbness.  10-point ROS  otherwise negative.  ____________________________________________   PHYSICAL EXAM:  VITAL SIGNS: ED Triage Vitals  Enc Vitals Group     BP 07/02/15 1349 119/73 mmHg     Pulse Rate 07/02/15 1349 68     Resp 07/02/15 1349 16     Temp 07/02/15 1349 98.3 F (36.8 C)     Temp Source 07/02/15 1349 Oral     SpO2 07/02/15 1349 100 %     Weight 07/02/15 1349 215 lb (97.523 kg)     Height 07/02/15 1349 5\' 8"  (1.727 m)     Head Cir --      Peak Flow --      Pain Score 07/02/15 1403 7     Pain Loc --      Pain Edu? --      Excl. in GC? --     Constitutional: Alert and oriented. Well appearing and in no acute distress. Eyes: Conjunctivae are normal. PERRL. EOMI. Head: Atraumatic. Nose: No congestion/rhinnorhea. Mouth/Throat: Mucous membranes are moist.   Neck: No stridor.   Cardiovascular: Normal rate, regular rhythm. Grossly normal heart sounds.   Respiratory: Normal respiratory effort.  No retractions. Lungs CTAB. Gastrointestinal: Soft With gravid uterus with fundus palpated 1 cm below the umbilicus.  Genitourinary:  Normal external exam. Speculum exam with yellow discharge but no bleeding. Bimanual exam with a closed cervix. No CMT but mild uterine as well as adnexal tenderness to palpation without any masses palpated. Musculoskeletal: No lower extremity tenderness nor edema.  No joint effusions. Neurologic:  Normal speech and language. No gross focal neurologic deficits are appreciated. No gait instability. Skin:  Skin is warm, dry and intact. No rash noted. Psychiatric: Mood and affect are normal. Speech and behavior are normal.  ____________________________________________   LABS (all labs ordered are listed, but only abnormal results are displayed)  Labs Reviewed  WET PREP, GENITAL - Abnormal; Notable for the following:    WBC, Wet Prep HPF POC MODERATE (*)    All other components within normal limits  HCG, QUANTITATIVE, PREGNANCY - Abnormal; Notable for the following:      hCG, Beta Chain, Quant, S 15971 (*)    All other components within normal limits  URINALYSIS COMPLETEWITH MICROSCOPIC ([facility] ONLY) - Abnormal; Notable for the following:    Color, Urine YELLOW (*)    APPearance CLEAR (*)    All other components within normal limits  CHLAMYDIA/NGC RT PCR ([facility] ONLY)   ____________________________________________  EKG   ____________________________________________  RADIOLOGY   ____________________________________________   PROCEDURES   ____________________________________________   INITIAL IMPRESSION / ASSESSMENT AND PLAN / ED COURSE  Pertinent labs & imaging results that were available during my care of the patient were reviewed by me and considered in  my medical decision making (see chart for details).  Fetal heart tones at 140 in triage. Reviewed records and the patient's blood type is O+.  ----------------------------------------- 4:35 PM on 07/02/2015 -----------------------------------------  Patient continues to be resting comfortably at this time. Moderate WBC on the wet prep but this could be normal for pregnancy. Unclear cause of the bleeding but we did discuss pelvic rest. She is clear that she must not be having any vaginal intercourse putting anything in the vagina. Pending are gonorrhea and chlamydia PCR at this time but the patient says that she has already been tested this pregnancy and was negative for the sexually transmitted diseases. We also discussed that she should not be doing heavy lifting I'll be giving her work note. She will continue to take her prenatal vitamins. Due to this stage of pregnancy this will be called a threatened abortion. She knows to return to the emergency department immediately for any worsening or concerning symptoms. ____________________________________________   FINAL CLINICAL IMPRESSION(S) / ED DIAGNOSES  Threatened abortion.    NEW MEDICATIONS STARTED DURING THIS  VISIT:  New Prescriptions   No medications on file     Note:  This document was prepared using Dragon voice recognition software and may include unintentional dictation errors.    Myrna Blazer, MD 07/02/15 [phone]

## 2015-07-02 NOTE — Discharge Instructions (Signed)
Do not put anything in your vagina. This includes refraining from any vaginal intercourse. Also, you should not be doing any heavy lifting. Please discuss these restrictions further with your OB/GYN.  Vaginal Bleeding During Pregnancy, Second Trimester A small amount of bleeding (spotting) from the vagina is relatively common in pregnancy. It usually stops on its own. Various things can cause bleeding or spotting in pregnancy. Some bleeding may be related to the pregnancy, and some may not. Sometimes the bleeding is normal and is not a problem. However, bleeding can also be a sign of something serious. Be sure to tell your health care provider about any vaginal bleeding right away. Some possible causes of vaginal bleeding during the second trimester include:  Infection, inflammation, or growths on the cervix.   The placenta may be partially or completely covering the opening of the cervix inside the uterus (placenta previa).  The placenta may have separated from the uterus (abruption of the placenta).   You may be having early (preterm) labor.   The cervix may not be strong enough to keep a baby inside the uterus (cervical insufficiency).   Tiny cysts may have developed in the uterus instead of pregnancy tissue (molar pregnancy). HOME CARE INSTRUCTIONS  Watch your condition for any changes. The following actions may help to lessen any discomfort you are feeling:  Follow your health care provider's instructions for limiting your activity. If your health care provider orders bed rest, you may need to stay in bed and only get up to use the bathroom. However, your health care provider may allow you to continue light activity.  If needed, make plans for someone to help with your regular activities and responsibilities while you are on bed rest.  Keep track of the number of pads you use each day, how often you change pads, and how soaked (saturated) they are. Write this down.  Do not use  tampons. Do not douche.  Do not have sexual intercourse or orgasms until approved by your health care provider.  If you pass any tissue from your vagina, save the tissue so you can show it to your health care provider.  Only take over-the-counter or prescription medicines as directed by your health care provider.  Do not take aspirin because it can make you bleed.  Do not exercise or perform any strenuous activities or heavy lifting without your health care provider's permission.  Keep all follow-up appointments as directed by your health care provider. SEEK MEDICAL CARE IF:  You have any vaginal bleeding during any part of your pregnancy.  You have cramps or labor pains.  You have a fever, not controlled by medicine. SEEK IMMEDIATE MEDICAL CARE IF:   You have severe cramps in your back or belly (abdomen).  You have contractions.  You have chills.  You pass large clots or tissue from your vagina.  Your bleeding increases.  You feel light-headed or weak, or you have fainting episodes.  You are leaking fluid or have a gush of fluid from your vagina. MAKE SURE YOU:  Understand these instructions.  Will watch your condition.  Will get help right away if you are not doing well or get worse.   This information is not intended to replace advice given to you by your health care provider. Make sure you discuss any questions you have with your health care provider.   Document Released: 10/03/2004 Document Revised: 12/29/2012 Document Reviewed: 08/31/2012 Elsevier Interactive Patient Education [employer]2016 Elsevier Inc.

## 2015-07-05 ENCOUNTER — Encounter: Payer: Medicaid Other | Admitting: Obstetrics and Gynecology

## 2015-07-12 ENCOUNTER — Other Ambulatory Visit: Payer: Medicaid Other

## 2015-07-12 ENCOUNTER — Encounter: Payer: Medicaid Other | Admitting: Obstetrics and Gynecology

## 2015-07-12 ENCOUNTER — Telehealth: Payer: Self-pay | Admitting: Obstetrics and Gynecology

## 2015-07-12 NOTE — Telephone Encounter (Signed)
Dr. Valentino Saxonherry aware.

## 2015-07-12 NOTE — Telephone Encounter (Addendum)
Pt came for appt, but was late and was told that she wouldn't be able to be seen. Pt stated that she has been bleeding since she came in for that appt and that she really needs to be seen. Pt originally stated that she was going to transfer care to another OBGYN, but has decided that she is going to stick with Dr. Valentino Saxonherry. Pt was advised that there is nothing available today for U/S or Dr. Valentino Saxonherry and that we would consult with the MD to see when we would be able to get her in. Please advise.

## 2015-07-13 ENCOUNTER — Encounter: Payer: Self-pay | Admitting: Obstetrics and Gynecology

## 2015-07-13 ENCOUNTER — Ambulatory Visit (INDEPENDENT_AMBULATORY_CARE_PROVIDER_SITE_OTHER): Payer: Medicaid Other | Admitting: Obstetrics and Gynecology

## 2015-07-13 VITALS — BP 110/68 | HR 68 | Wt 206.4 lb

## 2015-07-13 DIAGNOSIS — B379 Candidiasis, unspecified: Secondary | ICD-10-CM | POA: Insufficient documentation

## 2015-07-13 DIAGNOSIS — Z369 Encounter for antenatal screening, unspecified: Secondary | ICD-10-CM

## 2015-07-13 DIAGNOSIS — Z36 Encounter for antenatal screening of mother: Secondary | ICD-10-CM

## 2015-07-13 DIAGNOSIS — Z1389 Encounter for screening for other disorder: Secondary | ICD-10-CM

## 2015-07-13 LAB — POCT URINALYSIS DIPSTICK
BILIRUBIN UA: NEGATIVE
Blood, UA: NEGATIVE
Glucose, UA: NEGATIVE
Ketones, UA: NEGATIVE
NITRITE UA: NEGATIVE
PROTEIN UA: NEGATIVE
Spec Grav, UA: 1.01
UROBILINOGEN UA: NEGATIVE
pH, UA: 6

## 2015-07-13 MED ORDER — FLUCONAZOLE 150 MG PO TABS: 150.0000 mg | ORAL_TABLET | Freq: Once | ORAL | Status: DC

## 2015-07-13 NOTE — Progress Notes (Signed)
Rob- spotting on/off x 2 weeks, last episode 3 days ago, lower pelvic pain, thin white d/c noted on speculum exam.  Microscopic wet-mount exam shows negative for pathogens, normal epithelial cells, lactobacilli.- rx sent in for Diflucan, reassured of probable cause of spotting, but to not put anything vaginally until ultrasound next week to confirm placental placement.

## 2015-07-18 ENCOUNTER — Ambulatory Visit (INDEPENDENT_AMBULATORY_CARE_PROVIDER_SITE_OTHER): Payer: Medicaid Other

## 2015-07-18 DIAGNOSIS — Z3492 Encounter for supervision of normal pregnancy, unspecified, second trimester: Secondary | ICD-10-CM

## 2015-07-18 DIAGNOSIS — Z3482 Encounter for supervision of other normal pregnancy, second trimester: Secondary | ICD-10-CM | POA: Diagnosis not present

## 2015-07-25 ENCOUNTER — Other Ambulatory Visit: Payer: Medicaid Other

## 2015-07-25 ENCOUNTER — Other Ambulatory Visit: Payer: Self-pay | Admitting: Obstetrics and Gynecology

## 2015-07-25 DIAGNOSIS — Z0489 Encounter for examination and observation for other specified reasons: Secondary | ICD-10-CM

## 2015-07-25 DIAGNOSIS — IMO0002 Reserved for concepts with insufficient information to code with codable children: Secondary | ICD-10-CM

## 2015-07-26 ENCOUNTER — Ambulatory Visit (INDEPENDENT_AMBULATORY_CARE_PROVIDER_SITE_OTHER): Payer: Medicaid Other

## 2015-07-26 DIAGNOSIS — Z36 Encounter for antenatal screening of mother: Secondary | ICD-10-CM

## 2015-07-26 DIAGNOSIS — Z0489 Encounter for examination and observation for other specified reasons: Secondary | ICD-10-CM

## 2015-07-26 DIAGNOSIS — IMO0002 Reserved for concepts with insufficient information to code with codable children: Secondary | ICD-10-CM

## 2015-08-09 ENCOUNTER — Ambulatory Visit (INDEPENDENT_AMBULATORY_CARE_PROVIDER_SITE_OTHER): Payer: Medicaid Other | Admitting: Obstetrics and Gynecology

## 2015-08-09 ENCOUNTER — Encounter: Payer: Self-pay | Admitting: Obstetrics and Gynecology

## 2015-08-09 VITALS — BP 95/60 | HR 73 | Wt 210.8 lb

## 2015-08-09 DIAGNOSIS — Z131 Encounter for screening for diabetes mellitus: Secondary | ICD-10-CM

## 2015-08-09 DIAGNOSIS — Z3492 Encounter for supervision of normal pregnancy, unspecified, second trimester: Secondary | ICD-10-CM

## 2015-08-09 DIAGNOSIS — Z3482 Encounter for supervision of other normal pregnancy, second trimester: Secondary | ICD-10-CM

## 2015-08-09 LAB — POCT URINALYSIS DIPSTICK
BILIRUBIN UA: NEGATIVE
Blood, UA: NEGATIVE
GLUCOSE UA: NEGATIVE
KETONES UA: NEGATIVE
Nitrite, UA: NEGATIVE
Protein, UA: NEGATIVE
Urobilinogen, UA: NEGATIVE
pH, UA: 7

## 2015-08-09 NOTE — Progress Notes (Signed)
ROB: Patient doing ok.  Notes a lot going on at home since FOB has been checked into rehab (is doing 90 day program).  Concerned about delivering early as last baby came 2 weeks early.  Given reassurance. S/p f/u anatomy scan, normal.  RTC in  4 weeks, for glucola at that time.

## 2015-09-05 ENCOUNTER — Ambulatory Visit: Payer: Medicaid Other | Admitting: Obstetrics and Gynecology

## 2015-09-07 ENCOUNTER — Telehealth: Payer: Self-pay | Admitting: Obstetrics and Gynecology

## 2015-09-07 NOTE — Telephone Encounter (Signed)
PT CALLED AND SHE IS ABOUT [redacted] WEEKS PREGNANT AND SHE HAS BEEN GETTING NOSE BLEEDS AND WANTED TO MAKE SURE IF THIS WAS NORMAL DURING PREGNANCY.

## 2015-09-08 NOTE — Telephone Encounter (Signed)
Called pt she states that she is having nose bleeds approx 2 everyday. Pt has done nothing for relief. Pt states that bleeding is minimal most times but has had one episode of "heavier bleeding". Advised pt on the use of Vaseline in the nostrils TID also the use of cool mist humidifier as well as cold compresses to the back of the neck. Will f/u at pt's next appointment in a few days to reaccess.

## 2015-09-13 ENCOUNTER — Other Ambulatory Visit: Payer: Medicaid Other

## 2015-09-13 ENCOUNTER — Ambulatory Visit (INDEPENDENT_AMBULATORY_CARE_PROVIDER_SITE_OTHER): Payer: Medicaid Other | Admitting: Obstetrics and Gynecology

## 2015-09-13 VITALS — BP 102/65 | HR 70 | Wt 221.3 lb

## 2015-09-13 DIAGNOSIS — Z3483 Encounter for supervision of other normal pregnancy, third trimester: Secondary | ICD-10-CM | POA: Diagnosis not present

## 2015-09-13 DIAGNOSIS — J302 Other seasonal allergic rhinitis: Secondary | ICD-10-CM

## 2015-09-13 DIAGNOSIS — Z131 Encounter for screening for diabetes mellitus: Secondary | ICD-10-CM

## 2015-09-13 DIAGNOSIS — Z3493 Encounter for supervision of normal pregnancy, unspecified, third trimester: Secondary | ICD-10-CM

## 2015-09-13 DIAGNOSIS — Z23 Encounter for immunization: Secondary | ICD-10-CM

## 2015-09-13 LAB — POCT URINALYSIS DIPSTICK
BILIRUBIN UA: NEGATIVE
Glucose, UA: NEGATIVE
KETONES UA: NEGATIVE
NITRITE UA: NEGATIVE
PH UA: 7.5
Protein, UA: NEGATIVE
RBC UA: NEGATIVE
Spec Grav, UA: 1.01
Urobilinogen, UA: NEGATIVE

## 2015-09-13 MED ORDER — TETANUS-DIPHTH-ACELL PERTUSSIS 5-2.5-18.5 LF-MCG/0.5 IM SUSP
0.5000 mL | Freq: Once | INTRAMUSCULAR | Status: AC
  Administered 2015-09-13: 0.5 mL via INTRAMUSCULAR

## 2015-09-13 NOTE — Progress Notes (Signed)
ROB: Notes occasional seasonal allergies.  Advised on Benadryl, Zyrtec, or Claritin. For 28 week labs today.  Unsure about breast vs bottle feeding,  Desires Nexplanon for contraception. For Tdap today, signed blood consent, discussed cord blood banking. RTC in 2 weeks.

## 2015-09-14 LAB — CBC
Hematocrit: 35.4 % (ref 34.0–46.6)
Hemoglobin: 11.8 g/dL (ref 11.1–15.9)
MCH: 30.1 pg (ref 26.6–33.0)
MCHC: 33.3 g/dL (ref 31.5–35.7)
MCV: 90 fL (ref 79–97)
PLATELETS: 168 10*3/uL (ref 150–379)
RBC: 3.92 x10E6/uL (ref 3.77–5.28)
RDW: 13.8 % (ref 12.3–15.4)
WBC: 10.3 10*3/uL (ref 3.4–10.8)

## 2015-09-14 LAB — VARICELLA ZOSTER ANTIBODY, IGG: Varicella zoster IgG: 1603 index (ref >165)

## 2015-09-14 LAB — RUBELLA SCREEN: Rubella Antibodies, IGG: 3.82 index (ref >0.99)

## 2015-09-14 LAB — GLUCOSE, 1 HOUR GESTATIONAL: Gestational Diabetes Screen: 106 mg/dL (ref 65–139)

## 2015-09-18 ENCOUNTER — Encounter: Payer: Self-pay | Admitting: Emergency Medicine

## 2015-09-18 ENCOUNTER — Inpatient Hospital Stay
Admission: EM | Admit: 2015-09-18 | Discharge: 2015-09-18 | Disposition: A | Discharge status: Home / Self Care | Payer: Medicaid Other | Source: Home / Self Care | Admitting: Obstetrics & Gynecology

## 2015-09-18 ENCOUNTER — Emergency Department
Admission: EM | Admit: 2015-09-18 | Discharge: 2015-09-18 | Disposition: A | Discharge status: Home / Self Care | Payer: Medicaid Other | Attending: Emergency Medicine | Admitting: Emergency Medicine

## 2015-09-18 DIAGNOSIS — Y9389 Activity, other specified: Secondary | ICD-10-CM | POA: Insufficient documentation

## 2015-09-18 DIAGNOSIS — S5001XA Contusion of right elbow, initial encounter: Secondary | ICD-10-CM | POA: Insufficient documentation

## 2015-09-18 DIAGNOSIS — S300XXA Contusion of lower back and pelvis, initial encounter: Secondary | ICD-10-CM | POA: Insufficient documentation

## 2015-09-18 DIAGNOSIS — O9A213 Injury, poisoning and certain other consequences of external causes complicating pregnancy, third trimester: Secondary | ICD-10-CM | POA: Insufficient documentation

## 2015-09-18 DIAGNOSIS — Z3A29 29 weeks gestation of pregnancy: Secondary | ICD-10-CM | POA: Insufficient documentation

## 2015-09-18 DIAGNOSIS — F1290 Cannabis use, unspecified, uncomplicated: Secondary | ICD-10-CM | POA: Diagnosis not present

## 2015-09-18 DIAGNOSIS — Z79899 Other long term (current) drug therapy: Secondary | ICD-10-CM | POA: Diagnosis not present

## 2015-09-18 DIAGNOSIS — Y990 Civilian activity done for income or pay: Secondary | ICD-10-CM | POA: Insufficient documentation

## 2015-09-18 DIAGNOSIS — F17210 Nicotine dependence, cigarettes, uncomplicated: Secondary | ICD-10-CM | POA: Insufficient documentation

## 2015-09-18 DIAGNOSIS — J45909 Unspecified asthma, uncomplicated: Secondary | ICD-10-CM | POA: Insufficient documentation

## 2015-09-18 DIAGNOSIS — W11XXXA Fall on and from ladder, initial encounter: Secondary | ICD-10-CM | POA: Insufficient documentation

## 2015-09-18 DIAGNOSIS — Y929 Unspecified place or not applicable: Secondary | ICD-10-CM | POA: Diagnosis not present

## 2015-09-18 NOTE — Discharge Instructions (Signed)
You were evaluated after a fall, and no serious injuries suspected. He may take Tylenol as needed while you're pregnant for aches and pains.  Return to the emergency room for any worsening pain, and certainly any weakness or numbness. Return for any abdominal pain, vaginal bleeding or vaginal leakage of fluid.  Go to OB/GYN for evaluation and possible monitoring today directly.

## 2015-09-18 NOTE — ED Triage Notes (Addendum)
Pt presents to ED with c/o falling at work today off a ladder, pt reports lost balance, fell onto the wall and hit back and butt on wall. Denies LOC, states has not felt baby move but reports it is normal. States did not hit abdomen. Pt c/o lower abdominal pain. Pt states does not want to file workers comp. Pt alert and oriented x 4, no increased work in breathing noted.

## 2015-09-18 NOTE — Discharge Instructions (Signed)
Call provider or return to [facility]place with: ? ?1. Regular contractions ?2. Leaking of fluid from your vagina ?3. Vaginal bleeding: Bright red or heavy like a period ?4. Decreased Fetal movement  ?

## 2015-09-18 NOTE — ED Notes (Signed)
Discharge instructions reviewed with patient. Patient verbalized understanding. Patient taken to L&D via wheelchair for observation

## 2015-09-18 NOTE — OB Triage Note (Signed)
Pt presents to L&D after falling at work around 1530 off of a ladder. Pt reports she was up 4 steps (approximatl;y counter hight) when she fell backwads. Reports her back hitting first then her butt. No trauma to abd. No loss of consciousness. Reports no vaginal bleeding, no leaking of fluid and no abd pains. Reports good fetal movemnt since arrival. EFM and toco applied and explained. Plan to monitor fetal and maternal well being.

## 2015-09-18 NOTE — ED Provider Notes (Signed)
[facility] Emergency Department Provider Note ____________________________________________   I have reviewed the triage vital signs and the triage nursing note.  HISTORY  Chief Complaint Fall   Historian Patient  HPI Rose [name] is a 25 y.o. female who is [redacted] weeks pregnant, fell backwards off of ladder step landing on buttock/tailbone area, and is here for trauma evaluation. She states that she would not have come if she was not pregnant, and she is mostly just worried to make sure everything is okay with the baby. She doesn't usually feel the baby move to much, but nothing is really a friend or out of the ordinary in terms of that. She's not had any vaginal bleeding or vaginal leakage of fluid. She has some pelvic fullness which is typical, no new abdominal pain or pelvic pain.    Past Medical History:  Diagnosis Date  . Asthma   . History of postpartum hemorrhage   . Hx of chlamydia infection    +12/22/11, TOC neg   . IBD (inflammatory bowel disease)    Chron's disease  . 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   . Personal history UTI     Patient Active Problem List   Diagnosis Date Noted  . Yeast infection 07/13/2015  . H/O postpartum hemorrhage, currently pregnant 05/20/2015  . Rubella non-immune status, antepartum 05/20/2015  . Maternal varicella, non-immune 05/20/2015  . Tobacco abuse 05/20/2015  . Obesity (BMI 30.0-34.9) 05/20/2015  . Weight loss, unintentional 05/20/2015  . Migraine with aura and without status migrainosus, not intractable 05/20/2015  . Left arm numbness     Past Surgical History:  Procedure Laterality Date  . APPENDECTOMY  2013   acute appendicitis  . EYE SURGERY    . LAPAROSCOPIC APPENDECTOMY  02/03/2012   Procedure: APPENDECTOMY LAPAROSCOPIC;  Surgeon: Wilmon Arms. Corliss Skains, MD;  Location: [facility] OR;  Service: General;  Laterality: N/A;  . lymph node biopsy     Left  neck after cat scratch fever  . TYMPANOSTOMY TUBE PLACEMENT      Prior to Admission medications   Medication Sig Start Date End Date Taking? Authorizing Provider  acetaminophen (TYLENOL) 325 MG tablet Take 650 mg by mouth every 6 (six) hours as needed.    Historical Provider, MD  albuterol (PROVENTIL HFA;VENTOLIN HFA) 108 (90 Base) MCG/ACT inhaler Inhale 2 puffs into the lungs every 6 (six) hours as needed for wheezing or shortness of breath.    Historical Provider, MD  butalbital-acetaminophen-caffeine (FIORICET) 50-325-40 MG tablet Take 1-2 tablets by mouth every 6 (six) hours as needed for headache. 06/20/15 06/19/16  Hildred Laser, MD  Prenatal Vit-Fe Fumarate-FA (PRENATAL VITAMIN) 27-0.8 MG TABS Take 1 tablet by mouth every morning.     Historical Provider, MD  promethazine (PHENERGAN) 12.5 MG tablet Take 1 tablet (12.5 mg total) by mouth every 6 (six) hours as needed for nausea or vomiting. 04/08/15   Jennye Moccasin, MD    Allergies  Allergen Reactions  . Penicillins Hives    Has patient had a PCN reaction causing immediate rash, facial/tongue/throat swelling, SOB or lightheadedness with hypotension: Yes Has patient had a PCN reaction causing severe rash involving mucus membranes or skin necrosis: No Has patient had a PCN reaction that required hospitalization Yes Has patient had a PCN reaction occurring within the last 10 years: No If all of the above answers are "NO", then may proceed with Cephalosporin use.  Family History  Problem Relation Age of Onset  . COPD Mother   . Asthma Mother   . Lung cancer Maternal Grandmother     Social History Social History  Substance Use Topics  . Smoking status: Current Every Day Smoker    Types: Cigarettes  . Smokeless tobacco: Never Used     Comment: One pack per week.  . Alcohol use No     Comment: Occasion use - socially    Review of Systems  Constitutional: Negative for fever. Eyes: Negative for visual changes. ENT: Negative  for sore throat. Cardiovascular: Negative for chest pain. Respiratory: Negative for shortness of breath. Gastrointestinal: Negative for  vomiting and diarrhea. Genitourinary: Negative for dysuria. Musculoskeletal: Positive for soreness at the low back  Skin: Negative for rash. Neurological: Negative for headache.  No head injury 10 point Review of Systems otherwise negative ____________________________________________   PHYSICAL EXAM:  VITAL SIGNS: ED Triage Vitals [09/18/15 1913]  Enc Vitals Group     BP 133/72     Pulse Rate 87     Resp 18     Temp 98.3 F (36.8 C)     Temp Source Oral     SpO2 98 %     Weight 221 lb (100.2 kg)     Height 5\' 9"  (1.753 m)     Head Circumference      Peak Flow      Pain Score 6     Pain Loc      Pain Edu?      Excl. in GC?      Constitutional: Alert and oriented. Well appearing and in no distress. HEENT   Head: Normocephalic and atraumatic.      Eyes: Conjunctivae are normal. PERRL. Normal extraocular movements.      Ears:         Nose: No congestion/rhinnorhea.   Mouth/Throat: Mucous membranes are moist.   Neck: No stridor. Cardiovascular/Chest: Normal rate, regular rhythm.  No murmurs, rubs, or gallops. Respiratory: Normal respiratory effort without tachypnea nor retractions. Breath sounds are clear and equal bilaterally. No wheezes/rales/rhonchi. Gastrointestinal: Soft. No distention, no guarding, no rebound. Nontender. Gravid at near umbilicus  Genitourinary/rectal:Deferred Musculoskeletal: Right elbow medially small bruise normal range of motion. Neurologic:  Normal speech and language. No gross or focal neurologic deficits are appreciated. Skin:  Skin is warm, dry and intact. No rash noted. Psychiatric: Mood and affect are normal. Speech and behavior are normal. Patient exhibits appropriate insight and judgment.   ____________________________________________  LABS (pertinent positives/negatives)  Labs Reviewed -  No data to display  ____________________________________________    EKG I, Governor Rooksebecca Chantel Teti, MD, the attending physician have personally viewed and interpreted all ECGs.  None ____________________________________________  RADIOLOGY All Xrays were viewed by me. Imaging interpreted by Radiologist.  None __________________________________________  PROCEDURES  Procedure(s) performed: None  Critical Care performed: None  ____________________________________________   ED COURSE / ASSESSMENT AND PLAN  Pertinent labs & imaging results that were available during my care of the patient were reviewed by me and considered in my medical decision making (see chart for details).   Her exam is reassuring. She did state that she would've come here on account of the fall other than the fact that she is pregnant. No evidence of significant traumatic injury. Fetal heart tones done here. Patient will be sent up for OB evaluation of possible monitoring.     CONSULTATIONS:   None   Patient / Family / Caregiver informed of clinical  course, medical decision-making process, and agree with plan.   I discussed return precautions, follow-up instructions, and discharge instructions with patient and/or family.   ___________________________________________   FINAL CLINICAL IMPRESSION(S) / ED DIAGNOSES   Final diagnoses:  Contusion of lower back, initial encounter  Contusion of right elbow, initial encounter              Note: This dictation was prepared with Dragon dictation. Any transcriptional errors that result from this process are unintentional    Governor Rooks, MD 09/18/15 [phone]

## 2015-09-18 NOTE — ED Notes (Signed)
Spoke with L&D to let them know that pt has been cleared by ER physician and would need to come up to L&D for observation. Pt to go to room observation 1 for Dr. Tiburcio PeaHarris.

## 2015-09-18 NOTE — Progress Notes (Signed)
Dr Tiburcio PeaHarris called, MD covering for Dr Valentino Saxonherry. History recived as well as pt report of fall. Reviewed VS, FHR tracing reactive and no contractions. Order received to d/c home.

## 2015-10-03 ENCOUNTER — Ambulatory Visit (INDEPENDENT_AMBULATORY_CARE_PROVIDER_SITE_OTHER): Payer: Medicaid Other | Admitting: Obstetrics and Gynecology

## 2015-10-03 ENCOUNTER — Ambulatory Visit (INDEPENDENT_AMBULATORY_CARE_PROVIDER_SITE_OTHER): Payer: Medicaid Other

## 2015-10-03 VITALS — BP 103/67 | HR 88 | Wt 224.9 lb

## 2015-10-03 DIAGNOSIS — Z3483 Encounter for supervision of other normal pregnancy, third trimester: Secondary | ICD-10-CM

## 2015-10-03 DIAGNOSIS — O26843 Uterine size-date discrepancy, third trimester: Secondary | ICD-10-CM

## 2015-10-03 DIAGNOSIS — Z3493 Encounter for supervision of normal pregnancy, unspecified, third trimester: Secondary | ICD-10-CM | POA: Diagnosis not present

## 2015-10-03 DIAGNOSIS — Z23 Encounter for immunization: Secondary | ICD-10-CM

## 2015-10-03 LAB — POCT URINALYSIS DIPSTICK
Bilirubin, UA: NEGATIVE
Glucose, UA: NEGATIVE
Ketones, UA: NEGATIVE
NITRITE UA: NEGATIVE
PH UA: 7.5
PROTEIN UA: NEGATIVE
Spec Grav, UA: 1.01
UROBILINOGEN UA: NEGATIVE

## 2015-10-03 NOTE — Progress Notes (Signed)
ROB: Notes fall from a ladder last week, was evaluated in ER with no trauma to patient or fetus noted.  Notes hip pain. Discussed pregnancy band, birthing ball, yoga poses. Size>dates today, will get growth scan. For flu shot today. RTC in 2 weeks.

## 2015-10-09 ENCOUNTER — Telehealth: Payer: Self-pay | Admitting: Obstetrics and Gynecology

## 2015-10-09 NOTE — Telephone Encounter (Signed)
[redacted]  wk pregnant, had flu shot last tuesday, pt felt crappy since, sat and Sun felt worse, woke up with fever of 101 today.

## 2015-10-09 NOTE — Telephone Encounter (Signed)
Called pt she states that since receiving the flu vaccine last Wednesday she has felt very fatigued, sore throat, chest pain, and today began with fever. Pt states she is progressively getting worse. Advised pt to seek care at urgent care or Ed as there is no provider in the office currently. Pt gave verbal understanding.

## 2015-10-16 ENCOUNTER — Telehealth: Payer: Self-pay | Admitting: Obstetrics and Gynecology

## 2015-10-16 ENCOUNTER — Encounter: Payer: Self-pay | Admitting: Obstetrics and Gynecology

## 2015-10-16 DIAGNOSIS — O3660X0 Maternal care for excessive fetal growth, unspecified trimester, not applicable or unspecified: Secondary | ICD-10-CM | POA: Insufficient documentation

## 2015-10-16 NOTE — Telephone Encounter (Signed)
Called pt she states that she is having inconsistent contractions. Pt states that she has been having these contraction on and off all night. Pt states that the contractions are not too painful (able to talk to me without hesitating or difficulty) pt states she is just concerned because she 1st delivery went fast. Pt denies bleeding, leaking fluid, but does not she lost mucus plug a few days ago. Gave pt true labor precautions, will give not for work today.

## 2015-10-16 NOTE — Telephone Encounter (Signed)
Pt is 33wk 5 dys .Marland Kitchen. Pt has had contractions all nite 12-17 mins apart. She wants someone to call her and let her know what to do.

## 2015-10-17 ENCOUNTER — Encounter: Payer: Self-pay | Admitting: Obstetrics and Gynecology

## 2015-10-17 ENCOUNTER — Ambulatory Visit (INDEPENDENT_AMBULATORY_CARE_PROVIDER_SITE_OTHER): Payer: Medicaid Other | Admitting: Obstetrics and Gynecology

## 2015-10-17 VITALS — BP 110/70 | HR 71 | Wt 221.5 lb

## 2015-10-17 DIAGNOSIS — O99343 Other mental disorders complicating pregnancy, third trimester: Secondary | ICD-10-CM

## 2015-10-17 DIAGNOSIS — F329 Major depressive disorder, single episode, unspecified: Secondary | ICD-10-CM

## 2015-10-17 DIAGNOSIS — O3663X0 Maternal care for excessive fetal growth, third trimester, not applicable or unspecified: Secondary | ICD-10-CM

## 2015-10-17 DIAGNOSIS — R634 Abnormal weight loss: Secondary | ICD-10-CM

## 2015-10-17 DIAGNOSIS — Z3483 Encounter for supervision of other normal pregnancy, third trimester: Secondary | ICD-10-CM

## 2015-10-17 LAB — POCT URINALYSIS DIPSTICK
Bilirubin, UA: NEGATIVE
Blood, UA: NEGATIVE
Glucose, UA: NEGATIVE
KETONES UA: NEGATIVE
NITRITE UA: NEGATIVE
PH UA: 6.5
Spec Grav, UA: 1.015
Urobilinogen, UA: NEGATIVE

## 2015-10-17 MED ORDER — S-ADENOSYLMETHIONINE 400 MG PO TABS: 1.0000 | ORAL_TABLET | Freq: Two times a day (BID) | ORAL | 2 refills | Status: DC

## 2015-10-17 NOTE — Progress Notes (Signed)
ROB: Patient c/o depression symptoms over the last month, symptoms worsening. Very tearful today. Notes not wanting to care for her kids, agitation and mood swings. Denies SI/HI. PHQ-9 score is 22.  Discussed treatment options, including recommendations for psychotherapy with medications.  Patient declined medications. Referred to counseling. Offered treatment with natural supplement SAM-E.    Also discussed patient's recent ultrasound results, currently with macrosomia ( EFW: 2670g (5lb 14 oz) Williams 94th percentile).  Normal AFI of 17.4 cm.  Patient actually with weight loss since last visit (3 lbs). Will continue to follow patient's growth with serial ultrasounds. Discussed that if fetus remains macrosomic near time of delivery, may need to consider alternative method of delivery (i.e. C-section).  Patient notes understanding.

## 2015-10-26 ENCOUNTER — Telehealth: Payer: Self-pay | Admitting: Obstetrics and Gynecology

## 2015-10-26 NOTE — Telephone Encounter (Signed)
Patient states she has a lump on her breast that she believes is a clogged milk duct. She states the spot is hot to the touch.

## 2015-10-26 NOTE — Telephone Encounter (Signed)
Put on Melod[facility] schedule on Friday. Patient was unable to come in today

## 2015-10-26 NOTE — Telephone Encounter (Signed)
Per Dr.Cherry the pt needs to be seen. Amy says Melody will see her, please add her to their afternoon schedule. Thanks

## 2015-10-27 ENCOUNTER — Ambulatory Visit (INDEPENDENT_AMBULATORY_CARE_PROVIDER_SITE_OTHER): Payer: Medicaid Other | Admitting: Obstetrics and Gynecology

## 2015-10-27 VITALS — BP 95/62 | HR 77 | Wt 221.4 lb

## 2015-10-27 DIAGNOSIS — Z3493 Encounter for supervision of normal pregnancy, unspecified, third trimester: Secondary | ICD-10-CM

## 2015-10-27 DIAGNOSIS — L02411 Cutaneous abscess of right axilla: Secondary | ICD-10-CM

## 2015-10-27 DIAGNOSIS — Z113 Encounter for screening for infections with a predominantly sexual mode of transmission: Secondary | ICD-10-CM

## 2015-10-27 DIAGNOSIS — Z3685 Encounter for antenatal screening for Streptococcus B: Secondary | ICD-10-CM

## 2015-10-27 LAB — POCT URINALYSIS DIPSTICK
BILIRUBIN UA: NEGATIVE
Blood, UA: NEGATIVE
GLUCOSE UA: NEGATIVE
KETONES UA: NEGATIVE
LEUKOCYTES UA: NEGATIVE
Nitrite, UA: NEGATIVE
SPEC GRAV UA: 1.015
Urobilinogen, UA: 0.2
pH, UA: 6.5

## 2015-10-27 LAB — OB RESULTS CONSOLE GBS: GBS: NEGATIVE

## 2015-10-27 MED ORDER — LIDOCAINE HCL 2 % EX GEL: 1.0000 "application " | CUTANEOUS | 2 refills | Status: DC | PRN

## 2015-10-27 MED ORDER — CEFUROXIME AXETIL 500 MG PO TABS: 500.0000 mg | ORAL_TABLET | Freq: Two times a day (BID) | ORAL | 0 refills | Status: DC

## 2015-10-27 NOTE — Progress Notes (Signed)
Work-in OB- cultures obtained due to report of regular contractions and spotting this am. Labor precautions discussed; Initially here due to boil under right anticubital x3-4 days; getting larger and more painful- used warm compresses; 1.5 x3cm abscess noted exquistly tender without drainage, superficial, no signs of trauma, antibiotics and lidocaine gel ordered.

## 2015-10-27 NOTE — Progress Notes (Signed)
OB WORK IN- R arm pit, sore to touch, x 6 days

## 2015-10-27 NOTE — Addendum Note (Signed)
Addended by: Rosine BeatLONTZ, Remonia Otte L on: 10/27/2015 02:23 PM   Modules accepted: Orders

## 2015-10-28 ENCOUNTER — Encounter: Payer: Self-pay | Admitting: *Deleted

## 2015-10-28 ENCOUNTER — Observation Stay
Admission: EM | Admit: 2015-10-28 | Discharge: 2015-10-28 | Disposition: A | Discharge status: Home / Self Care | Payer: Medicaid Other | Attending: Obstetrics and Gynecology | Admitting: Obstetrics and Gynecology

## 2015-10-28 DIAGNOSIS — O4703 False labor before 37 completed weeks of gestation, third trimester: Secondary | ICD-10-CM | POA: Diagnosis not present

## 2015-10-28 DIAGNOSIS — Z3A35 35 weeks gestation of pregnancy: Secondary | ICD-10-CM | POA: Insufficient documentation

## 2015-10-28 NOTE — OB Triage Note (Signed)
C/O no fetal movement x 15 hours. Rose [name], [name]

## 2015-10-29 LAB — STREP GP B NAA+RFLX: STREP GP B NAA+RFLX: NEGATIVE

## 2015-10-29 LAB — GC/CHLAMYDIA PROBE AMP
CHLAMYDIA, DNA PROBE: NEGATIVE
NEISSERIA GONORRHOEAE BY PCR: NEGATIVE

## 2015-10-29 NOTE — Discharge Summary (Signed)
L&D OB Triage Note  SUBJECTIVE Rose [name] is a 25 y.o. G2P1001 female at 2773w4d, EDD Estimated Date of Delivery: 11/29/15 who presented to triage with complaints of contractions.  Patient has not gone to health department for follow-up prenatal care since her last visit 1 month ago   OBJECTIVE Nursing Evaluation: BP 122/72 (BP Location: Left Arm)   Pulse 92   Temp 98.8 F (37.1 C) (Oral)   Resp 16   Ht 5\' 8"  (1.727 m)   Wt 221 lb (100.2 kg)   LMP 02/10/2015 (Approximate) Comment: nuvaring  BMI 33.60 kg/m  no significant findings for labor  NST was performed and has been reviewed by me.  NST INTERPRETATION: Indications: Contractions  Mode: External (intermittent tracing due to maternal/fetal movement) Baseline Rate (A): 130 bpm Variability: Moderate Accelerations: 15 x 15 Decelerations: None     Contraction Frequency (min): irreg with ui  ASSESSMENT Impression:  1. Pregnancy:  G2P1001 at 7773w4d , EDD Estimated Date of Delivery: 11/29/15 2.  reactive NST  PLAN 1. Reassurance given 2. Discharge home with bleeding/labor precautions.  3. Continue routine prenatal care. Patient is to follow-up at [facility] next week for establishment of care, due to not being seen by the health department because of conflicting recommendations (no appointment availability for this patient until after her due date)   Herold HarmsMartin A Anjeli Casad, MD

## 2015-10-31 ENCOUNTER — Ambulatory Visit (INDEPENDENT_AMBULATORY_CARE_PROVIDER_SITE_OTHER): Payer: Medicaid Other | Admitting: Obstetrics and Gynecology

## 2015-10-31 ENCOUNTER — Ambulatory Visit (INDEPENDENT_AMBULATORY_CARE_PROVIDER_SITE_OTHER): Payer: Medicaid Other

## 2015-10-31 VITALS — BP 106/72 | HR 81 | Wt 226.3 lb

## 2015-10-31 DIAGNOSIS — O3663X0 Maternal care for excessive fetal growth, third trimester, not applicable or unspecified: Secondary | ICD-10-CM | POA: Diagnosis not present

## 2015-10-31 DIAGNOSIS — E669 Obesity, unspecified: Secondary | ICD-10-CM

## 2015-10-31 DIAGNOSIS — O09293 Supervision of pregnancy with other poor reproductive or obstetric history, third trimester: Secondary | ICD-10-CM

## 2015-10-31 DIAGNOSIS — Z3483 Encounter for supervision of other normal pregnancy, third trimester: Secondary | ICD-10-CM

## 2015-10-31 NOTE — Assessment & Plan Note (Signed)
98%ile at 36 weeks (8 lb 6 oz)

## 2015-10-31 NOTE — Progress Notes (Signed)
ROB: Patient denies complaints. Notes increased pressure. Had ctx over the weekend but have resolved.  Notes depression sx have improved some. GBS neg.  Growth scan with fetus at 98%ile (8 lbs 6 oz). Discussed LGA fetus and delivery no further than 40 weeks. Patient reports possible h/o PPH in prior pregnancy.  RTC in 1 week. Patient voided prior to visit.

## 2015-11-02 DIAGNOSIS — Z0289 Encounter for other administrative examinations: Secondary | ICD-10-CM

## 2015-11-05 ENCOUNTER — Observation Stay
Admission: EM | Admit: 2015-11-05 | Discharge: 2015-11-05 | Disposition: A | Discharge status: Home / Self Care | Payer: Managed Care, Other (non HMO) | Attending: Obstetrics & Gynecology | Admitting: Obstetrics & Gynecology

## 2015-11-05 DIAGNOSIS — O479 False labor, unspecified: Secondary | ICD-10-CM | POA: Diagnosis not present

## 2015-11-05 DIAGNOSIS — Z3A00 Weeks of gestation of pregnancy not specified: Secondary | ICD-10-CM | POA: Insufficient documentation

## 2015-11-05 NOTE — Discharge Summary (Signed)
Patient discharged with instructions on follow up appointments, labor precautions, fetal kick count and when to seek medical attention. Patient ambulatory at discharge with steady gait and no complaints. Patient discharged with significant other.

## 2015-11-05 NOTE — OB Triage Note (Signed)
Patient came in for observation for possible SROM at 2000. Patient reports irregular uterine contractions.  Patient complains of leaking of fluid at 2000 while sitting at the kitchen table at 2000 but denies vaginal bleeding and spotting. Patient reports decreased fetal movement not feeling the baby ,ove but six times today., but has been feeling the baby move fine since then. States the baby's movement has been about the same as the past few weeks. Vital signs stable and patient afebrile. FHR baseline 130 with moderate variability with accelerations 15 x 15 and no decelerations. Significant at bedside. Will continue to monitor.

## 2015-11-08 ENCOUNTER — Ambulatory Visit (INDEPENDENT_AMBULATORY_CARE_PROVIDER_SITE_OTHER): Payer: Medicaid Other | Admitting: Obstetrics and Gynecology

## 2015-11-08 VITALS — BP 128/80 | HR 90 | Wt 227.5 lb

## 2015-11-08 DIAGNOSIS — E66811 Obesity, class 1: Secondary | ICD-10-CM

## 2015-11-08 DIAGNOSIS — O3663X0 Maternal care for excessive fetal growth, third trimester, not applicable or unspecified: Secondary | ICD-10-CM

## 2015-11-08 DIAGNOSIS — Z3483 Encounter for supervision of other normal pregnancy, third trimester: Secondary | ICD-10-CM

## 2015-11-08 DIAGNOSIS — Z720 Tobacco use: Secondary | ICD-10-CM

## 2015-11-08 DIAGNOSIS — E669 Obesity, unspecified: Secondary | ICD-10-CM

## 2015-11-08 LAB — POCT URINALYSIS DIPSTICK
BILIRUBIN UA: NEGATIVE
Glucose, UA: NEGATIVE
KETONES UA: NEGATIVE
Nitrite, UA: NEGATIVE
PH UA: 6.5
PROTEIN UA: NEGATIVE
SPEC GRAV UA: 1.015
Urobilinogen, UA: NEGATIVE

## 2015-11-08 NOTE — Progress Notes (Signed)
ROB: Patient complains of increased pelvic pressure. Reiterated smoking cessation (patient with tobacco odor in exam room). Labor precautions given.  RTC in 1 week.

## 2015-11-14 ENCOUNTER — Encounter: Payer: Medicaid Other | Admitting: Obstetrics and Gynecology

## 2015-11-14 ENCOUNTER — Inpatient Hospital Stay: Payer: Managed Care, Other (non HMO) | Admitting: Anesthesiology

## 2015-11-14 ENCOUNTER — Inpatient Hospital Stay
Admission: EM | Admit: 2015-11-14 | Discharge: 2015-11-16 | DRG: 774 | Disposition: A | Discharge status: Home / Self Care | Payer: Managed Care, Other (non HMO) | Attending: Obstetrics and Gynecology | Admitting: Obstetrics and Gynecology

## 2015-11-14 DIAGNOSIS — O3663X0 Maternal care for excessive fetal growth, third trimester, not applicable or unspecified: Principal | ICD-10-CM | POA: Diagnosis present

## 2015-11-14 DIAGNOSIS — J45909 Unspecified asthma, uncomplicated: Secondary | ICD-10-CM | POA: Diagnosis present

## 2015-11-14 DIAGNOSIS — Z3A37 37 weeks gestation of pregnancy: Secondary | ICD-10-CM | POA: Diagnosis not present

## 2015-11-14 DIAGNOSIS — F1290 Cannabis use, unspecified, uncomplicated: Secondary | ICD-10-CM | POA: Diagnosis present

## 2015-11-14 DIAGNOSIS — Z3403 Encounter for supervision of normal first pregnancy, third trimester: Secondary | ICD-10-CM | POA: Diagnosis present

## 2015-11-14 DIAGNOSIS — O9952 Diseases of the respiratory system complicating childbirth: Secondary | ICD-10-CM | POA: Diagnosis present

## 2015-11-14 DIAGNOSIS — E669 Obesity, unspecified: Secondary | ICD-10-CM | POA: Diagnosis present

## 2015-11-14 DIAGNOSIS — O99324 Drug use complicating childbirth: Secondary | ICD-10-CM | POA: Diagnosis present

## 2015-11-14 DIAGNOSIS — Z3483 Encounter for supervision of other normal pregnancy, third trimester: Secondary | ICD-10-CM

## 2015-11-14 DIAGNOSIS — O09299 Supervision of pregnancy with other poor reproductive or obstetric history, unspecified trimester: Secondary | ICD-10-CM

## 2015-11-14 DIAGNOSIS — Z87891 Personal history of nicotine dependence: Secondary | ICD-10-CM

## 2015-11-14 DIAGNOSIS — O3660X0 Maternal care for excessive fetal growth, unspecified trimester, not applicable or unspecified: Secondary | ICD-10-CM | POA: Diagnosis present

## 2015-11-14 DIAGNOSIS — E66811 Obesity, class 1: Secondary | ICD-10-CM | POA: Diagnosis present

## 2015-11-14 LAB — CBC
HCT: 37.9 % (ref 35.0–47.0)
Hemoglobin: 13.1 g/dL (ref 12.0–16.0)
MCH: 30.8 pg (ref 26.0–34.0)
MCHC: 34.4 g/dL (ref 32.0–36.0)
MCV: 89.4 fL (ref 80.0–100.0)
PLATELETS: 220 10*3/uL (ref 150–440)
RBC: 4.24 MIL/uL (ref 3.80–5.20)
RDW: 12.2 % (ref 11.5–14.5)
WBC: 14 10*3/uL — ABNORMAL HIGH (ref 3.6–11.0)

## 2015-11-14 LAB — TYPE AND SCREEN
ABO/RH(D): O POS
Antibody Screen: NEGATIVE

## 2015-11-14 MED ORDER — DIPHENHYDRAMINE HCL 50 MG/ML IJ SOLN: 12.5000 mg | INTRAMUSCULAR | Status: DC | PRN

## 2015-11-14 MED ORDER — ACETAMINOPHEN 325 MG PO TABS
650.0000 mg | ORAL_TABLET | Freq: Four times a day (QID) | ORAL | Status: DC | PRN
  Administered 2015-11-14: 650 mg via ORAL

## 2015-11-14 MED ORDER — LACTATED RINGERS IV SOLN: INTRAVENOUS | Status: DC

## 2015-11-14 MED ORDER — DIPHENHYDRAMINE HCL 25 MG PO CAPS: 25.0000 mg | ORAL_CAPSULE | ORAL | Status: DC | PRN

## 2015-11-14 MED ORDER — DIPHENHYDRAMINE HCL 25 MG PO CAPS: 25.0000 mg | ORAL_CAPSULE | Freq: Four times a day (QID) | ORAL | Status: DC | PRN

## 2015-11-14 MED ORDER — OXYTOCIN 40 UNITS IN LACTATED RINGERS INFUSION - SIMPLE MED
INTRAVENOUS | Status: AC
  Administered 2015-11-14: 999 mL/h
  Filled 2015-11-14: qty 1000

## 2015-11-14 MED ORDER — LIDOCAINE HCL (PF) 1 % IJ SOLN
INTRAMUSCULAR | Status: DC | PRN
  Administered 2015-11-14 (×2): 1 mL via INTRADERMAL

## 2015-11-14 MED ORDER — NALOXONE HCL 0.4 MG/ML IJ SOLN: 0.4000 mg | INTRAMUSCULAR | Status: DC | PRN

## 2015-11-14 MED ORDER — DIBUCAINE 1 % RE OINT: 1.0000 "application " | TOPICAL_OINTMENT | RECTAL | Status: DC | PRN

## 2015-11-14 MED ORDER — NALBUPHINE HCL 10 MG/ML IJ SOLN: 5.0000 mg | Freq: Once | INTRAMUSCULAR | Status: DC | PRN

## 2015-11-14 MED ORDER — SENNOSIDES-DOCUSATE SODIUM 8.6-50 MG PO TABS
2.0000 | ORAL_TABLET | ORAL | Status: DC
  Administered 2015-11-14 – 2015-11-16 (×2): 2 via ORAL
  Filled 2015-11-14 (×2): qty 2

## 2015-11-14 MED ORDER — FENTANYL 2.5 MCG/ML W/ROPIVACAINE 0.2% IN NS 100 ML EPIDURAL INFUSION (ARMC-ANES)
EPIDURAL | Status: DC | PRN
  Administered 2015-11-14: 10 mL/h via EPIDURAL

## 2015-11-14 MED ORDER — SODIUM CHLORIDE FLUSH 0.9 % IV SOLN
INTRAVENOUS | Status: AC
  Filled 2015-11-14: qty 10

## 2015-11-14 MED ORDER — OXYTOCIN 10 UNIT/ML IJ SOLN
INTRAMUSCULAR | Status: AC
  Filled 2015-11-14: qty 2

## 2015-11-14 MED ORDER — FENTANYL 2.5 MCG/ML W/ROPIVACAINE 0.2% IN NS 100 ML EPIDURAL INFUSION (ARMC-ANES)
EPIDURAL | Status: AC
  Filled 2015-11-14: qty 100

## 2015-11-14 MED ORDER — BUTORPHANOL TARTRATE 1 MG/ML IJ SOLN
INTRAMUSCULAR | Status: AC
  Administered 2015-11-14: 1 mg via INTRAVENOUS
  Filled 2015-11-14: qty 1

## 2015-11-14 MED ORDER — PRENATAL MULTIVITAMIN CH
1.0000 | ORAL_TABLET | Freq: Every day | ORAL | Status: DC
  Administered 2015-11-14 – 2015-11-15 (×2): 1 via ORAL
  Filled 2015-11-14 (×2): qty 1

## 2015-11-14 MED ORDER — SODIUM CHLORIDE 0.9 % IV SOLN
INTRAVENOUS | Status: DC | PRN
  Administered 2015-11-14 (×2): 5 mL via EPIDURAL

## 2015-11-14 MED ORDER — SIMETHICONE 80 MG PO CHEW: 80.0000 mg | CHEWABLE_TABLET | ORAL | Status: DC | PRN

## 2015-11-14 MED ORDER — COCONUT OIL OIL: 1.0000 "application " | TOPICAL_OIL | Status: DC | PRN

## 2015-11-14 MED ORDER — NALBUPHINE HCL 10 MG/ML IJ SOLN: 5.0000 mg | INTRAMUSCULAR | Status: DC | PRN

## 2015-11-14 MED ORDER — AMMONIA AROMATIC IN INHA
RESPIRATORY_TRACT | Status: AC
  Filled 2015-11-14: qty 10

## 2015-11-14 MED ORDER — METHYLERGONOVINE MALEATE 0.2 MG/ML IJ SOLN
INTRAMUSCULAR | Status: AC
  Administered 2015-11-14: 0.2 mg
  Filled 2015-11-14: qty 1

## 2015-11-14 MED ORDER — FENTANYL 2.5 MCG/ML W/ROPIVACAINE 0.2% IN NS 100 ML EPIDURAL INFUSION (ARMC-ANES): 10.0000 mL/h | EPIDURAL | Status: DC

## 2015-11-14 MED ORDER — ACETAMINOPHEN 325 MG PO TABS
650.0000 mg | ORAL_TABLET | ORAL | Status: DC | PRN
Start: 2015-11-14 — End: 2015-11-16
  Administered 2015-11-15: 650 mg via ORAL
  Filled 2015-11-14: qty 2

## 2015-11-14 MED ORDER — LIDOCAINE-EPINEPHRINE (PF) 1.5 %-1:200000 IJ SOLN
INTRAMUSCULAR | Status: DC | PRN
  Administered 2015-11-14: 3 mL via EPIDURAL

## 2015-11-14 MED ORDER — ONDANSETRON HCL 4 MG PO TABS
4.0000 mg | ORAL_TABLET | ORAL | Status: DC | PRN
Start: 2015-11-14 — End: 2015-11-16

## 2015-11-14 MED ORDER — IBUPROFEN 600 MG PO TABS
600.0000 mg | ORAL_TABLET | Freq: Four times a day (QID) | ORAL | Status: DC
  Administered 2015-11-14 – 2015-11-16 (×7): 600 mg via ORAL
  Filled 2015-11-14 (×7): qty 1

## 2015-11-14 MED ORDER — SODIUM CHLORIDE 0.9% FLUSH: 3.0000 mL | INTRAVENOUS | Status: DC | PRN

## 2015-11-14 MED ORDER — ACETAMINOPHEN 325 MG PO TABS
ORAL_TABLET | ORAL | Status: AC
  Administered 2015-11-14: 650 mg via ORAL
  Filled 2015-11-14: qty 2

## 2015-11-14 MED ORDER — METHYLERGONOVINE MALEATE 0.2 MG PO TABS: 0.2000 mg | ORAL_TABLET | ORAL | Status: DC | PRN

## 2015-11-14 MED ORDER — BUTORPHANOL TARTRATE 1 MG/ML IJ SOLN
1.0000 mg | INTRAMUSCULAR | Status: DC | PRN
  Administered 2015-11-14: 1 mg via INTRAVENOUS

## 2015-11-14 MED ORDER — OXYCODONE-ACETAMINOPHEN 5-325 MG PO TABS
2.0000 | ORAL_TABLET | ORAL | Status: DC | PRN
Start: 2015-11-14 — End: 2015-11-16

## 2015-11-14 MED ORDER — WITCH HAZEL-GLYCERIN EX PADS: 1.0000 "application " | MEDICATED_PAD | CUTANEOUS | Status: DC | PRN

## 2015-11-14 MED ORDER — ZOLPIDEM TARTRATE 5 MG PO TABS
5.0000 mg | ORAL_TABLET | Freq: Every evening | ORAL | Status: DC | PRN
Start: 2015-11-14 — End: 2015-11-16

## 2015-11-14 MED ORDER — METHYLERGONOVINE MALEATE 0.2 MG/ML IJ SOLN: 0.2000 mg | INTRAMUSCULAR | Status: DC | PRN

## 2015-11-14 MED ORDER — LIDOCAINE HCL (PF) 1 % IJ SOLN
INTRAMUSCULAR | Status: AC
  Filled 2015-11-14: qty 30

## 2015-11-14 MED ORDER — FERROUS SULFATE 325 (65 FE) MG PO TABS
325.0000 mg | ORAL_TABLET | Freq: Two times a day (BID) | ORAL | Status: DC
  Administered 2015-11-14 – 2015-11-16 (×4): 325 mg via ORAL
  Filled 2015-11-14 (×4): qty 1

## 2015-11-14 MED ORDER — BENZOCAINE-MENTHOL 20-0.5 % EX AERO: 1.0000 "application " | INHALATION_SPRAY | CUTANEOUS | Status: DC | PRN

## 2015-11-14 MED ORDER — OXYCODONE-ACETAMINOPHEN 5-325 MG PO TABS
1.0000 | ORAL_TABLET | ORAL | Status: DC | PRN
Start: 2015-11-14 — End: 2015-11-16

## 2015-11-14 MED ORDER — ONDANSETRON HCL 4 MG/2ML IJ SOLN
4.0000 mg | INTRAMUSCULAR | Status: DC | PRN
Start: 2015-11-14 — End: 2015-11-16

## 2015-11-14 MED ORDER — MISOPROSTOL 200 MCG PO TABS
ORAL_TABLET | ORAL | Status: AC
  Administered 2015-11-14: 800 ug
  Filled 2015-11-14: qty 4

## 2015-11-14 MED ORDER — NALOXONE HCL 2 MG/2ML IJ SOSY: 1.0000 ug/kg/h | PREFILLED_SYRINGE | INTRAVENOUS | Status: DC | PRN

## 2015-11-14 NOTE — H&P (Signed)
Obstetric History and Physical  Rose [name] is a 25 y.o. G2P1001 with IUP at 8698w6d presenting for complaints of contractions since yesterday, worseing overnight.  Patient states she has been having  regular, every 3-4 minutes contractions, none vaginal bleeding, intact membranes, with active fetal movement.    Prenatal Course Source of Care: [facility]  with onset of care at 10 weeks Pregnancy complications or risks: Patient Active Problem List   Diagnosis Date Noted  . Labor and delivery, indication for care 11/05/2015  . LGA (large for gestational age) fetus affecting management of mother 10/16/2015  . H/O postpartum hemorrhage, currently pregnant 05/20/2015  . Tobacco abuse 05/20/2015  . Obesity (BMI 30.0-34.9) 05/20/2015  . Weight loss, unintentional 05/20/2015  . Migraine with aura and without status migrainosus, not intractable 05/20/2015  . Left arm numbness    She plans to breast and bottle feed.  She desires Nexplanon for postpartum contraception.   Prenatal labs and studies: ABO, Rh: --/--/O POS (11/07 0153) Antibody: NEG (11/07 0153) Rubella: 3.82 (09/06 [id number]) RPR: Non Reactive (03/27 1200)  HBsAg: Negative (03/27 1200)  HIV: Non Reactive (03/27 1200)  JXB:JYNWGNFAGBS:Negative (10/20 1512) 1 hr Glucola  Normal (106) Genetic screening normal Anatomy US normal   Past Medical History:  Diagnosis Date  . Asthma   . History of postpartum hemorrhage   . Hx of chlamydia infection    +12/22/11, TOC neg   . IBD (inflammatory bowel disease)    Chron's disease  . 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   . Personal history UTI     Past Surgical History:  Procedure Laterality Date  . APPENDECTOMY  2013   acute appendicitis  . EYE SURGERY    . LAPAROSCOPIC APPENDECTOMY  02/03/2012   Procedure: APPENDECTOMY LAPAROSCOPIC;  Surgeon: Wilmon ArmsMatthew K. Corliss Skainssuei, MD;  Location: [facility] OR;  Service: General;  Laterality: N/A;  .  lymph node biopsy     Left neck after cat scratch fever  . TYMPANOSTOMY TUBE PLACEMENT      OB History  Gravida Para Term Preterm AB Living  2 1 1     1   SAB TAB Ectopic Multiple Live Births          1    # Outcome Date GA Lbr Len/2nd Weight Sex Delivery Anes PTL Lv  2 Current           1 Term 2011 5312w0d   M Vag-Spont None N LIV    Obstetric Comments  Postpartum hemorrhage after 1st delivery, did not require blood transfusion.     Social History   Social History  . Marital status: Single    Spouse name: N/A  . Number of children: 1  . Years of education: 7514   Occupational History  . Cashier at [employer]    Social History Main Topics  . Smoking status: Former Smoker    Types: Cigarettes    Quit date: 06/08/2015  . Smokeless tobacco: Never Used     Comment: One pack per week.  . Alcohol use No     Comment: Occasion use - socially  . Drug use:     Types: Marijuana  . Sexual activity: Yes    Birth control/ protection: Pill   Other Topics Concern  . None   Social History Narrative   Lives at home with her son.   Right-handed.   6-8 cups caffeine per day.  Family History  Problem Relation Age of Onset  . COPD Mother   . Asthma Mother   . Lung cancer Maternal Grandmother     Prescriptions Prior to Admission  Medication Sig Dispense Refill Last Dose  . acetaminophen (TYLENOL) 325 MG tablet Take 650 mg by mouth every 6 (six) hours as needed.   Not Taking at Unknown time  . albuterol (PROVENTIL HFA;VENTOLIN HFA) 108 (90 Base) MCG/ACT inhaler Inhale 2 puffs into the lungs every 6 (six) hours as needed for wheezing or shortness of breath.   Not Taking at Unknown time  . butalbital-acetaminophen-caffeine (FIORICET) 50-325-40 MG tablet Take 1-2 tablets by mouth every 6 (six) hours as needed for headache. (Patient not taking: Reported on 11/14/2015) 20 tablet 3 Not Taking at Unknown time  . docusate sodium (COLACE) 100 MG capsule Take 100 mg by mouth daily.   Not Taking  at Unknown time  . Prenatal Vit-Fe Fumarate-FA (PRENATAL VITAMIN) 27-0.8 MG TABS Take 1 tablet by mouth every morning.    Not Taking at Unknown time    Allergies  Allergen Reactions  . Penicillins Hives    Has patient had a PCN reaction causing immediate rash, facial/tongue/throat swelling, SOB or lightheadedness with hypotension: Yes Has patient had a PCN reaction causing severe rash involving mucus membranes or skin necrosis: No Has patient had a PCN reaction that required hospitalization Yes Has patient had a PCN reaction occurring within the last 10 years: No If all of the above answers are "NO", then may proceed with Cephalosporin use.     Review of Systems: Negative except for what is mentioned in HPI.  Physical Exam: BP 113/61 (BP Location: Left Arm)   Pulse 70   Temp 98 F (36.7 C) (Oral)   Resp 20   Ht 5\' 8"  (1.727 m)   Wt 226 lb (102.5 kg)   LMP 02/10/2015 (Approximate) Comment: nuvaring  SpO2 99%   BMI 34.36 kg/m  CONSTITUTIONAL: Well-developed, well-nourished female in no acute distress.  HENT:  Normocephalic, atraumatic, External right and left ear normal. Oropharynx is clear and moist EYES: Conjunctivae and EOM are normal. Pupils are equal, round, and reactive to light. No scleral icterus.  NECK: Normal range of motion, supple, no masses SKIN: Skin is warm and dry. No rash noted. Not diaphoretic. No erythema. No pallor. NEUROLOGIC: Alert and oriented to person, place, and time. Normal reflexes, muscle tone coordination. No cranial nerve deficit noted. PSYCHIATRIC: Normal mood and affect. Normal behavior. Normal judgment and thought content. CARDIOVASCULAR: Normal heart rate noted, regular rhythm RESPIRATORY: Effort and breath sounds normal, no problems with respiration noted ABDOMEN: Soft, nontender, nondistended, gravid. MUSCULOSKELETAL: Normal range of motion. No edema and no tenderness. 2+ distal pulses.  Cervical Exam: Dilatation 7 cm   Effacement 80%    Station -1   Presentation: cephalic FHT:  Baseline rate 140 bpm   Variability moderate  Accelerations present   Decelerations none Contractions: Every 2-3 mins   Pertinent Labs/Studies:   Results for orders placed or performed during the hospital encounter of 11/14/15 (from the past 24 hour(s))  CBC     Status: Abnormal   Collection Time: 11/14/15  1:53 AM  Result Value Ref Range   WBC 14.0 (H) 3.6 - 11.0 K/uL   RBC 4.24 3.80 - 5.20 MIL/uL   Hemoglobin 13.1 12.0 - 16.0 g/dL   HCT 16.1 09.6 - 04.5 %   MCV 89.4 80.0 - 100.0 fL   MCH 30.8 26.0 - 34.0 pg  MCHC 34.4 32.0 - 36.0 g/dL   RDW 96.012.2 45.411.5 - 09.814.5 %   Platelets 220 150 - 440 K/uL  Type and screen [facility]     Status: None   Collection Time: 11/14/15  1:53 AM  Result Value Ref Range   ABO/RH(D) O POS    Antibody Screen NEG    Sample Expiration 11/17/2015      ULTRASOUND REPORT  Location: [facility] Date of Service: 10/31/15  Indications:Macrosomia Findings:  Mason JimSingleton intrauterine pregnancy is visualized with FHR at 132 BPM. Biometrics give an (U/S) Gestational age of 24 2/7 weeks and an (U/S) EDD of 11/05/15; this DOES NOT correlate with the clinically established EDD of 11/29/15.  Fetal presentation is Vertex.  EFW: 3645g (8lb 1oz) Williams 98th percentile. Placenta: Anterior, Grade 2-3, remote to cervix. AFI: 17.6 cm.   Impression: 1. 39 2/7 week Viable Singleton Intrauterine pregnancy by U/S. 2. (U/S) EDD is NOT consistent with Clinically established (LMP) EDD of 11/29/15. 3. Measures larger than dates by fetal biometry  Recommendations: 1.Clinical correlation with the patient's History and Physical Exam.   Assessment : Rose [name] is a 25 y.o. G2P1001 at 5365w6d being admitted for labor, LGA fetus, h/o PPH in prior pregnancy.  Plan: Labor: Expectant management.  Induction/Augmentation as needed, per protocol.  AROM'd with clear fluid.  Preparations for  possible PPH in current pregnancy being made.  FWB: Reassuring fetal heart tracing.  GBS negative Delivery plan: Hopeful for vaginal delivery   Hildred LaserAnika Tiffny Gemmer, MD [facility]

## 2015-11-14 NOTE — Anesthesia Procedure Notes (Signed)
Epidural Patient location during procedure: OB Start time: 11/14/2015 4:07 AM End time: 11/14/2015 4:14 AM  Staffing Anesthesiologist: Margorie JohnPISCITELLO, Laurajean Hosek K Performed: anesthesiologist   Preanesthetic Checklist Completed: patient identified, site marked, surgical consent, pre-op evaluation, timeout performed, IV checked, risks and benefits discussed and monitors and equipment checked  Epidural Patient position: sitting Prep: Betadine Patient monitoring: heart rate, continuous pulse ox and blood pressure Approach: midline Location: L4-L5 Injection technique: LOR saline  Needle:  Needle type: Tuohy  Needle gauge: 17 G Needle length: 9 cm and 9 Needle insertion depth: 7 cm Catheter type: closed end flexible Catheter size: 19 Gauge Catheter at skin depth: 12 cm Test dose: negative and 1.5% lidocaine with Epi 1:200 K  Assessment Sensory level: T10 Events: blood not aspirated, injection not painful, no injection resistance, negative IV test and no paresthesia  Additional Notes Left sided paresthesia with placement that resolved with needle redirection.   Pt. Evaluated and documentation done after procedure finished. Patient identified. Risks/Benefits/Options discussed with patient including but not limited to bleeding, infection, nerve damage, paralysis, failed block, incomplete pain control, headache, blood pressure changes, nausea, vomiting, reactions to medication both or allergic, itching and postpartum back pain. Confirmed with bedside nurse the patient's most recent platelet count. Confirmed with patient that they are not currently taking any anticoagulation, have any bleeding history or any family history of bleeding disorders. Patient expressed understanding and wished to proceed. All questions were answered. Sterile technique was used throughout the entire procedure. Please see nursing notes for vital signs. Test dose was given through epidural catheter and negative prior to  continuing to dose epidural or start infusion. Warning signs of high block given to the patient including shortness of breath, tingling/numbness in hands, complete motor block, or any concerning symptoms with instructions to call for help. Patient was given instructions on fall risk and not to get out of bed. All questions and concerns addressed with instructions to call with any issues or inadequate analgesia.   Patient tolerated the insertion well without immediate complications.Reason for block:procedure for pain

## 2015-11-14 NOTE — Progress Notes (Signed)
RN called to the BS, "I just had a big gush." VSS; pt.'s color pink, O2 sats on room air 100%.  Dr. Valentino Saxonherry updated, methergine given,  0.2 ml given IM via left thigh.  Pad & Chux weight 440 g.  RN assist pt. Up to BR for void, 5 medium-large size clots dispelled, with mod bleeding. Fundus remains firm at U.    Assist with pericare, pt. Unable to void. Clean pad in place; pt. Back to bed. [facility]  Pt. texting with friends.

## 2015-11-14 NOTE — OB Triage Note (Signed)
Patient came in with contractions that began around 21:30 and states that they are every 2-5 mins apart. Denies vaginal bleeding, denies vaginal discharge. Reports positive fetal movement. Rates contractions pain a 7 out of 10.

## 2015-11-14 NOTE — Anesthesia Preprocedure Evaluation (Signed)
Anesthesia Evaluation  Patient identified by MRN, date of birth, ID band Patient awake    Reviewed: Allergy & Precautions, H&P , NPO status , Patient's Chart, lab work & pertinent test results  History of Anesthesia Complications Negative for: history of anesthetic complications  Airway Mallampati: III  TM Distance: >3 FB Neck ROM: full    Dental  (+) Poor Dentition   Pulmonary neg shortness of breath, asthma , former smoker,    Pulmonary exam normal breath sounds clear to auscultation       Cardiovascular Exercise Tolerance: Good (-) hypertensionnegative cardio ROS Normal cardiovascular exam Rhythm:regular Rate:Normal     Neuro/Psych  Headaches,    GI/Hepatic negative GI ROS,   Endo/Other    Renal/GU   negative genitourinary   Musculoskeletal   Abdominal   Peds  Hematology negative hematology ROS (+)   Anesthesia Other Findings Past Medical History: No date: Asthma No date: History of postpartum hemorrhage No date: Hx of chlamydia infection     Comment: +12/22/11, TOC neg  No date: IBD (inflammatory bowel disease)     Comment: Chron's disease No date: Migraine with aura     Comment: aura includes left side goes numb, blindness               (lasting 30-90 min).  No date: Personal history UTI  Past Surgical History: 2013: APPENDECTOMY     Comment: acute appendicitis No date: EYE SURGERY 02/03/2012: LAPAROSCOPIC APPENDECTOMY     Comment: Procedure: APPENDECTOMY LAPAROSCOPIC;                Surgeon: Wilmon ArmsMatthew K. Corliss Skainssuei, MD;  Location: [facility]               OR;  Service: General;  Laterality: N/A; No date: lymph node biopsy     Comment: Left neck after cat scratch fever No date: TYMPANOSTOMY TUBE PLACEMENT  BMI    Body Mass Index:  34.36 kg/m      Reproductive/Obstetrics (+) Pregnancy                             Anesthesia Physical Anesthesia Plan  ASA: III  Anesthesia Plan:  Epidural   Post-op Pain Management:    Induction:   Airway Management Planned:   Additional Equipment:   Intra-op Plan:   Post-operative Plan:   Informed Consent: I have reviewed the patients History and Physical, chart, labs and discussed the procedure including the risks, benefits and alternatives for the proposed anesthesia with the patient or authorized representative who has indicated his/her understanding and acceptance.     Plan Discussed with: Anesthesiologist  Anesthesia Plan Comments:         Anesthesia Quick Evaluation

## 2015-11-15 LAB — RPR: RPR Ser Ql: NONREACTIVE

## 2015-11-15 LAB — CBC
HCT: 27.2 % — ABNORMAL LOW (ref 35.0–47.0)
HEMOGLOBIN: 9.5 g/dL — AB (ref 12.0–16.0)
MCH: 31.1 pg (ref 26.0–34.0)
MCHC: 34.9 g/dL (ref 32.0–36.0)
MCV: 89.1 fL (ref 80.0–100.0)
PLATELETS: 165 10*3/uL (ref 150–440)
RBC: 3.05 MIL/uL — ABNORMAL LOW (ref 3.80–5.20)
RDW: 12.8 % (ref 11.5–14.5)
WBC: 11.2 10*3/uL — ABNORMAL HIGH (ref 3.6–11.0)

## 2015-11-15 NOTE — Anesthesia Postprocedure Evaluation (Signed)
Anesthesia Post Note  Patient: Rose [name]  Procedure(s) Performed: * No procedures listed *  Patient location during evaluation: Mother Baby Anesthesia Type: Epidural Level of consciousness: awake, awake and alert and oriented Pain management: pain level controlled Vital Signs Assessment: post-procedure vital signs reviewed and stable Respiratory status: spontaneous breathing, nonlabored ventilation and respiratory function stable Cardiovascular status: blood pressure returned to baseline Postop Assessment: no headache, no backache, patient able to bend at knees, no signs of nausea or vomiting and adequate PO intake Anesthetic complications: no    Last Vitals:  Vitals:   11/15/15 0413 11/15/15 0739  BP: 118/69 121/70  Pulse: 76 81  Resp: 20 16  Temp: 36.6 C 36.8 C    Last Pain:  Vitals:   11/15/15 0739  TempSrc: Oral  PainSc:                  Marlana SalvageSandra Zekiel Torian

## 2015-11-15 NOTE — Progress Notes (Signed)
Post Partum Day # 1, s/p SVD  Subjective: no complaints, up ad lib, voiding and tolerating PO  Objective: Temp:  [97.8 F (36.6 C)-98.9 F (37.2 C)] 98.2 F (36.8 C) (11/08 0739) Pulse Rate:  [72-81] 81 (11/08 0739) Resp:  [16-20] 16 (11/08 0739) BP: (118-134)/(61-79) 121/70 (11/08 0739) SpO2:  [99 %-100 %] 100 % (11/07 1606)  Physical Exam:  General: alert and no distress  Lungs: clear to auscultation bilaterally Breasts: normal appearance, no masses or tenderness Heart: regular rate and rhythm, S1, S2 normal, no murmur, click, rub or gallop Pelvis: Lochia: appropriate, Uterine Fundus: firm Extremities: DVT Evaluation: No evidence of DVT seen on physical exam. Negative Homan's sign. No cords or calf tenderness. No significant calf/ankle edema.   Recent Labs  11/14/15 0153 11/15/15 0533  HGB 13.1 9.5*  HCT 37.9 27.2*    Assessment/Plan: Plan for discharge tomorrow, Breastfeeding, Circumcision prior to discharge and Contraception Nexplanon Postpartum hemorrhage (patient with 500 ml EBL immediately postpartum, then 2 episodes of heavy vaginal bleeding (~ another 500-600 ml total) several hrs later.  Patient doing well, asymptomatic.  Will treat with PO iron.     LOS: 1 day   Hildred LaserAnika Tineka Uriegas [facility]

## 2015-11-16 MED ORDER — IBUPROFEN 800 MG PO TABS: 800.0000 mg | ORAL_TABLET | Freq: Three times a day (TID) | ORAL | 1 refills | Status: DC | PRN

## 2015-11-16 MED ORDER — FERROUS SULFATE 325 (65 FE) MG PO TABS: 325.0000 mg | ORAL_TABLET | Freq: Two times a day (BID) | ORAL | 1 refills | Status: DC

## 2015-11-16 NOTE — Progress Notes (Signed)
Patient verbalizes understanding of all discharge instructions and the need to make follow up appointments. Reviewed signs and symptoms of postpartum blues and postpartum depression, patient verbalized understanding. Patient discharge via wheelchair with auxillary.

## 2015-11-16 NOTE — Discharge Instructions (Signed)

## 2015-11-16 NOTE — Discharge Summary (Signed)
Obstetric Discharge Summary Reason for Admission: onset of labor Prenatal Procedures: ultrasound Intrapartum Procedures: spontaneous vaginal delivery Postpartum Procedures: none Complications-Operative and Postpartum: hemorrhage (treated with Cytotec and Methergine)  Hemoglobin  Date Value Ref Range Status  11/15/2015 9.5 (L) 12.0 - 16.0 g/dL Final    Comment:    RESULT REPEATED AND VERIFIED   HCT  Date Value Ref Range Status  11/15/2015 27.2 (L) 35.0 - 47.0 % Final   Hematocrit  Date Value Ref Range Status  09/13/2015 35.4 34.0 - 46.6 % Final    Physical Exam:  Blood pressure (!) 125/59, pulse 75, temperature 98.3 F (36.8 C), temperature source Oral, resp. rate 20, height 5\' 8"  (1.727 m), weight 226 lb (102.5 kg), last menstrual period 02/10/2015, SpO2 100 %, unknown if currently breastfeeding.  General: alert and no distress Lochia: appropriate Uterine Fundus: firm Incision: none DVT Evaluation: No evidence of DVT seen on physical exam. Negative Homan's sign. No cords or calf tenderness. No significant calf/ankle edema.  Discharge Diagnoses: Term Pregnancy-delivered and LGA infant and postpartum hemorrhage  Discharge Information: Date: 11/16/2015 Activity: pelvic rest Diet: routine Medications: PNV and Ibuprofen Condition: stable Instructions: refer to practice specific booklet Discharge to: home   Newborn Data: Live born female  Birth Weight: 8 lb 10.3 oz (3920 g) APGAR: 9, 9  Home with mother.  Rose [name] 11/16/2015, 6:06 AM

## 2015-11-16 NOTE — Progress Notes (Addendum)
Post Partum Day # 2, s/p SVD  Subjective: no complaints, up ad lib, voiding and tolerating PO  Objective: Blood pressure (!) 125/59, pulse 75, temperature 98.3 F (36.8 C), temperature source Oral, resp. rate 20, height 5\' 8"  (1.727 m), weight 226 lb (102.5 kg), last menstrual period 02/10/2015, SpO2 100 %, unknown if currently breastfeeding.  Physical Exam:  General: alert and no distress  Lungs: clear to auscultation bilaterally Breasts: normal appearance, no masses or tenderness Heart: regular rate and rhythm, S1, S2 normal, no murmur, click, rub or gallop Pelvis: Lochia: appropriate, Uterine Fundus: firm Extremities: DVT Evaluation: No evidence of DVT seen on physical exam. Negative Homan's sign. No cords or calf tenderness. No significant calf/ankle edema.   Recent Labs  11/14/15 0153 11/15/15 0533  HGB 13.1 9.5*  HCT 37.9 27.2*    Assessment/Plan: Discharge home, Circumcision after discharge and Contraception Nexplanon, Breastfeeding and bottle feeding.  Postpartum hemorrhage -  Patient continues to do well, asymptomatic mild anemia.  Continue to treat with PO iron.     LOS: 2 days   Hildred LaserAnika Velmer Woelfel [facility]

## 2015-11-22 ENCOUNTER — Encounter: Payer: Medicaid Other | Admitting: Obstetrics and Gynecology

## 2015-11-22 NOTE — Discharge Summary (Signed)
Patient presented for evaluation of labor and rupture of membranes.  Patient had cervical exam by RN and this was reported to me. I reviewed her vital signs and fetal tracing, both of which were reassuring. She had nitrazine testing which was negative and she had no obvious leakage of fluid. Patient was discharge as she was not laboring and did not have rupture of membranes.  I have reviewed her medical and obstetrical history.  Patient discharged with the usual precaution instructions/precautions with recommended routine follow up.  Thomasene MohairStephen Jackson, MD 11/22/2015 10:44 AM

## 2015-12-18 ENCOUNTER — Telehealth: Payer: Self-pay | Admitting: Obstetrics and Gynecology

## 2015-12-18 NOTE — Telephone Encounter (Signed)
PT CALLED NA D SHE HAD HER BABY 5 WEEKS AGO AND SHE STATED THAT SHE HAS HAD TROUBLE GOING TO THE BATHROOM AND HAVING A BM, SHE THOUGHT MAYBE IT WAS JUST DUE TO HAVING A BABY BUT ITS BEEN 5 WEEKS AND ITS GOTTEN HARDER AND HARDER TO USE THE BATHROOM, SHE STATED THAT SHE IS BLEEDING FROM HER RECTUM WHEN SHE DOES HAVE A BM,.

## 2015-12-18 NOTE — Telephone Encounter (Signed)
DONE

## 2015-12-18 NOTE — Telephone Encounter (Signed)
Called pt she states that she is having significant rectal bleeding with BMs (filled toliet bowl up yesterday) pt is very concerned about this and wishes to be seen. Pt also notes that she has a possible boil/cyst under her arm (multiple knots) had to be on antibiotic previously. Per A.Clontz will have pt added to [facility] schedule.  Please add pt to [facility] schedule for tomorrow at 11:15am.

## 2015-12-19 ENCOUNTER — Ambulatory Visit (INDEPENDENT_AMBULATORY_CARE_PROVIDER_SITE_OTHER): Payer: Medicaid Other | Admitting: Obstetrics and Gynecology

## 2015-12-19 ENCOUNTER — Encounter: Payer: Self-pay | Admitting: Obstetrics and Gynecology

## 2015-12-19 VITALS — BP 119/74 | HR 64 | Ht 68.0 in | Wt 214.4 lb

## 2015-12-19 DIAGNOSIS — G43B1 Ophthalmoplegic migraine, intractable: Secondary | ICD-10-CM

## 2015-12-19 DIAGNOSIS — K625 Hemorrhage of anus and rectum: Secondary | ICD-10-CM

## 2015-12-19 DIAGNOSIS — L0293 Carbuncle, unspecified: Secondary | ICD-10-CM

## 2015-12-19 MED ORDER — CEFIXIME 400 MG PO TABS: 400.0000 mg | ORAL_TABLET | Freq: Every day | ORAL | 1 refills | Status: DC

## 2015-12-19 MED ORDER — SUMATRIPTAN SUCCINATE 100 MG PO TABS: 100.0000 mg | ORAL_TABLET | Freq: Once | ORAL | 11 refills | Status: DC | PRN

## 2015-12-19 MED ORDER — HYDROCORTISONE ACE-PRAMOXINE 2.5-1 % RE CREA: 1.0000 "application " | TOPICAL_CREAM | Freq: Three times a day (TID) | RECTAL | 0 refills | Status: DC

## 2015-12-19 NOTE — Progress Notes (Signed)
Subjective:     Patient ID: Rose [name], female   DOB: 09/26/1990, 25 y.o.   MRN: [id number]  HPI Reports return of "boils under right arm 1 week ago and now small area under left arm. Very painful-has been doing epsom salt soaks as previously instructed, and one lesion on right side started draining this am. Has felt malaise but no fever.   Secondly, reports return of migraines since delivery, lasting 3-5 hours, cannot see when they occur and feels numbness on one side. Took Fiorecet and it helped a little. Occurring 3-4 times a week in last two weeks. [place] in [place], but desires a second opinion.  Third, reports rectal pain with daily BMs for 3 weeks, and bright red blood in tissue. Feels like a burning sensation, denies any rectal bulging noted. Does report having to strain to get stool out.   Stopped Breast feeding last week.  Review of Systems Negative except stated above    Objective:   Physical Exam A&O x4 Well groomed female  Blood pressure 119/74, pulse 64, height 5\' 8"  (1.727 m), weight 214 lb 6.4 oz (97.3 kg),  reight axilla with multiple boils, suggestive of hidraneditis, and one noted in left axilla, exquistely tender with scant yellow drainage noted at right bra line. Rectal exam: tenderness noted with internal exam and small rectal tear palpated at 2 o'clock, no blood seen on exam..    Assessment:     Recurrent boils in axilla Rectal tear Intractable migraines     Plan:     suprax daily x 10 days, culture obtained, will consider referral to dermatology if the return.  analapram HCL bid prn for one week, to increase water intake and fiber intake.  [facility] for second opinion per patient request. Imitrex 100mg  daily as needed. RTC as scheduled for PPV  Inesha Sow [place], CNM

## 2015-12-19 NOTE — Patient Instructions (Signed)
Hidradenitis Suppurativa Introduction Hidradenitis suppurativa is a long-term (chronic) skin disease that starts with blocked sweat glands or hair follicles. Bacteria may grow in these blocked openings of your skin. Hidradenitis suppurativa is like a severe form of acne that develops in areas of your body where acne would be unusual. It is most likely to affect the areas of your body where skin rubs against skin and becomes moist. This includes your:  Underarms.  Groin.  Genital areas.  Buttocks.  Upper thighs.  Breasts. Hidradenitis suppurativa may start out with small pimples. The pimples can develop into deep sores that break open (rupture) and drain pus. Over time your skin may thicken and become scarred. Hidradenitis suppurativa cannot be passed from person to person. What are the causes? The exact cause of hidradenitis suppurativa is not known. This condition may be due to:  Female and female hormones. The condition is rare before and after puberty.  An overactive body defense system (immune system). Your immune system may overreact to the blocked hair follicles or sweat glands and cause swelling and pus-filled sores. What increases the risk? You may have a higher risk of hidradenitis suppurativa if you:  Are a woman.  Are between ages 36 and 30.  Have a family history of hidradenitis suppurativa.  Have a personal history of acne.  Are overweight.  Smoke.  Take the drug lithium. What are the signs or symptoms? The first signs of an outbreak are usually painful skin bumps that look like pimples. As the condition progresses:  Skin bumps may get bigger and grow deeper into the skin.  Bumps under the skin may rupture and drain smelly pus.  Skin may become itchy and infected.  Skin may thicken and scar.  Drainage may continue through tunnels under the skin (fistulas).  Walking and moving your arms can become painful. How is this diagnosed? Your health care Marisa Hufstetler  may diagnose hidradenitis suppurativa based on your medical history and your signs and symptoms. A physical exam will also be done. You may need to see a health care Kristyanna Barcelo who specializes in skin diseases (dermatologist). You may also have tests done to confirm the diagnosis. These can include:  Swabbing a sample of pus or drainage from your skin so it can be sent to the lab and tested for infection.  Blood tests to check for infection. How is this treated? The same treatment will not work for everybody with hidradenitis suppurativa. Your treatment will depend on how severe your symptoms are. You may need to try several treatments to find what works best for you. Part of your treatment may include cleaning and bandaging (dressing) your wounds. You may also have to take medicines, such as the following:  Antibiotics.  Acne medicines.  Medicines to block or suppress the immune system.  A diabetes medicine (metformin) is sometimes used to treat this condition.  For women, birth control pills can sometimes help relieve symptoms. You may need surgery if you have a severe case of hidradenitis suppurativa that does not respond to medicine. Surgery may involve:  Using a laser to clear the skin and remove hair follicles.  Opening and draining deep sores.  Removing the areas of skin that are diseased and scarred. Follow these instructions at home:  Learn as much as you can about your disease, and work closely with your health care providers.  Take medicines only as directed by your health care Saara Kijowski.  If you were prescribed an antibiotic medicine, finish it all  even if you start to feel better.  If you are overweight, losing weight may be very helpful. Try to reach and maintain a healthy weight.  Do not use any tobacco products, including cigarettes, chewing tobacco, or electronic cigarettes. If you need help quitting, ask your health care Calene Paradiso.  Do not shave the areas where you  get hidradenitis suppurativa.  Do not wear deodorant.  Wear loose-fitting clothes.  Try not to overheat and get sweaty.  Take a daily bleach bath as directed by your health care Ikeem Cleckler.  Fill your bathtub halfway with water.  Pour in  cup of unscented household bleach.  Soak for 5-10 minutes.  Cover sore areas with a warm, clean washcloth (compress) for 5-10 minutes. Contact a health care Annika Selke if:  You have a flare-up of hidradenitis suppurativa.  You have chills or a fever.  You are having trouble controlling your symptoms at home. This information is not intended to replace advice given to you by your health care Akeelah Seppala. Make sure you discuss any questions you have with your health care Dashonna Chagnon. Document Released: 08/08/2003 Document Revised: 06/01/2015 Document Reviewed: 03/26/2013  2017 Elsevier Migraine Headache A migraine headache is an intense, throbbing pain on one side or both sides of the head. Migraines may also cause other symptoms, such as nausea, vomiting, and sensitivity to light and noise. What are the causes? Doing or taking certain things may also trigger migraines, such as:  Alcohol.  Smoking.  Medicines, such as:  Medicine used to treat chest pain (nitroglycerine).  Birth control pills.  Estrogen pills.  Certain blood pressure medicines.  Aged cheeses, chocolate, or caffeine.  Foods or drinks that contain nitrates, glutamate, aspartame, or tyramine.  Physical activity. Other things that may trigger a migraine include:  Menstruation.  Pregnancy.  Hunger.  Stress, lack of sleep, too much sleep, or fatigue.  Weather changes. What increases the risk? The following factors may make you more likely to experience migraine headaches:  Age. Risk increases with age.  Family history of migraine headaches.  Being Caucasian.  Depression and anxiety.  Obesity.  Being a woman.  Having a hole in the heart (patent foramen  ovale) or other heart problems. What are the signs or symptoms? The main symptom of this condition is pulsating or throbbing pain. Pain may:  Happen in any area of the head, such as on one side or both sides.  Interfere with daily activities.  Get worse with physical activity.  Get worse with exposure to bright lights or loud noises. Other symptoms may include:  Nausea.  Vomiting.  Dizziness.  General sensitivity to bright lights, loud noises, or smells. Before you get a migraine, you may get warning signs that a migraine is developing (aura). An aura may include:  Seeing flashing lights or having blind spots.  Seeing bright spots, halos, or zigzag lines.  Having tunnel vision or blurred vision.  Having numbness or a tingling feeling.  Having trouble talking.  Having muscle weakness. How is this diagnosed? A migraine headache can be diagnosed based on:  Your symptoms.  A physical exam.  Tests, such as CT scan or MRI of the head. These imaging tests can help rule out other causes of headaches.  Taking fluid from the spine (lumbar puncture) and analyzing it (cerebrospinal fluid analysis, or CSF analysis). How is this treated? A migraine headache is usually treated with medicines that:  Relieve pain.  Relieve nausea.  Prevent migraines from coming back. Treatment may also  include:  Acupuncture.  Lifestyle changes like avoiding foods that trigger migraines. Follow these instructions at home: Medicines  Take over-the-counter and prescription medicines only as told by your health care Essie Gehret.  Do not drive or use heavy machinery while taking prescription pain medicine.  To prevent or treat constipation while you are taking prescription pain medicine, your health care Roshan Roback may recommend that you:  Drink enough fluid to keep your urine clear or pale yellow.  Take over-the-counter or prescription medicines.  Eat foods that are high in fiber, such as  fresh fruits and vegetables, whole grains, and beans.  Limit foods that are high in fat and processed sugars, such as fried and sweet foods. Lifestyle  Avoid alcohol use.  Do not use any products that contain nicotine or tobacco, such as cigarettes and e-cigarettes. If you need help quitting, ask your health care Marien Manship.  Get at least 8 hours of sleep every night.  Limit your stress. General instructions  Keep a journal to find out what may trigger your migraine headaches. For example, write down:  What you eat and drink.  How much sleep you get.  Any change to your diet or medicines.  If you have a migraine:  Avoid things that make your symptoms worse, such as bright lights.  It may help to lie down in a dark, quiet room.  Do not drive or use heavy machinery.  Ask your health care Eugen Jeansonne what activities are safe for you while you are experiencing symptoms.  Keep all follow-up visits as told by your health care Yohanna Tow. This is important. Contact a health care Taneasha Fuqua if:  You develop symptoms that are different or more severe than your usual migraine symptoms. Get help right away if:  Your migraine becomes severe.  You have a fever.  You have a stiff neck.  You have vision loss.  Your muscles feel weak or like you cannot control them.  You start to lose your balance often.  You develop trouble walking.  You faint. This information is not intended to replace advice given to you by your health care Nazly Digilio. Make sure you discuss any questions you have with your health care Traylon Schimming. Document Released: 12/24/2004 Document Revised: 07/14/2015 Document Reviewed: 06/12/2015 Elsevier Interactive Patient Education  2017 [employer]

## 2015-12-22 NOTE — Progress Notes (Signed)
   OBSTETRICS POSTPARTUM CLINIC PROGRESS NOTE  Subjective:     Rose [name] is a 25 y.o. [id number] female who presents for a postpartum visit. She is 6 weeks postpartum following a spontaneous vaginal delivery. I have fully reviewed the prenatal and intrapartum course. The delivery was at 37 gestational weeks.  Anesthesia: epidural. Postpartum course has been complicated by migraines, rectal tear and hidradenitis which the patient [facility]. Baby's course has been complicated by colic. Baby is feeding by bottle - Enfamil Nutramigen. Bleeding: patient has resumed menses, with LMP 12/28/2015. Bowel function is normal. Bladder function is normal. Patient is not sexually active. Contraception method desired is Nexplanon. Postpartum depression screening: positive. PHQ-9 Score 23  Of note, patient notes that she is still having recurrent boils under her arms and along her back.  Was seen ~ 2 weeks ago and was given a prescription for an antibiotic for hidradenitis, but was unable to get it filled as it was on bback order.  Also notes that she is bruising easily.    The following portions of the patient's history were reviewed and updated as appropriate: allergies, current medications, past family history, past medical history, past social history, past surgical history and problem list.  Review of Systems Pertinent items noted in HPI and remainder of comprehensive ROS otherwise negative.   Objective:    BP 105/68 (BP Location: Left Arm, Patient Position: Sitting, Cuff Size: Large)   Pulse 76   Ht 5\' 8"  (1.727 m)   Wt 211 lb (95.7 kg)   LMP 12/28/2015   Breastfeeding? No   BMI 32.08 kg/m   General:  alert and no distress   Breasts:  inspection negative, no nipple discharge or bleeding, no masses or nodularity palpable  Lungs: clear to auscultation bilaterally  Heart:  regular rate and rhythm, S1, S2 normal, no murmur, click, rub or gallop  Abdomen: soft,  non-tender; bowel sounds normal; no masses,  no organomegaly.     Vulva:  normal  Vagina: normal vagina, no discharge, exudate, lesion, or erythema  Cervix:  no cervical motion tenderness and no lesions  Corpus: normal size, contour, position, consistency, mobility, non-tender  Adnexa:  normal adnexa and no mass, fullness, tenderness  Rectal Exam: Not performed.         Labs:  Lab Results  Component Value Date   HGB 9.5 (L) 11/15/2015    Assessment:   Routine postpartum exam.  Severe postpartum depression (See PHQ-9 questionnaire) Skin infection (boils) Migraines Anemia postpartum  Plan:   1. Contraception: desires Nexplanon.  Will have patient return in 1-2 weeks for insertion 2. Will check Hgb for h/o anemia.  3. Severe postpartum depression.  Lengthy discussion had with patient regarding symptoms.  Also had [facility] social worker involved with patient. Ref[facility].  Also discussed initation of medications. Will initiate on Prozac, start at 20 mg and titrate up to 40 mg.  4. Skin infections, likely hidradenitis.  Changed prescription to Bactrim DS, and also prescribed Bactroban ointment.  5. Migraines worsening an appointment with Neurology in January.  6. Follow up in: 2 weeks to reassess symptoms, or before as needed.    Hildred LaserAnika Ireta Pullman, MD [facility]

## 2015-12-26 ENCOUNTER — Telehealth: Payer: Self-pay | Admitting: Obstetrics and Gynecology

## 2015-12-26 ENCOUNTER — Encounter: Payer: Self-pay | Admitting: Obstetrics and Gynecology

## 2015-12-26 NOTE — Telephone Encounter (Signed)
error 

## 2015-12-28 ENCOUNTER — Encounter: Payer: Self-pay | Admitting: Obstetrics and Gynecology

## 2015-12-28 ENCOUNTER — Ambulatory Visit (INDEPENDENT_AMBULATORY_CARE_PROVIDER_SITE_OTHER): Payer: Medicaid Other | Admitting: Obstetrics and Gynecology

## 2015-12-28 DIAGNOSIS — F53 Postpartum depression: Secondary | ICD-10-CM

## 2015-12-28 DIAGNOSIS — L732 Hidradenitis suppurativa: Secondary | ICD-10-CM

## 2015-12-28 DIAGNOSIS — G43009 Migraine without aura, not intractable, without status migrainosus: Secondary | ICD-10-CM

## 2015-12-28 DIAGNOSIS — O9081 Anemia of the puerperium: Secondary | ICD-10-CM

## 2015-12-28 DIAGNOSIS — O99345 Other mental disorders complicating the puerperium: Secondary | ICD-10-CM

## 2015-12-28 MED ORDER — FLUOXETINE HCL 40 MG PO CAPS: 40.0000 mg | ORAL_CAPSULE | Freq: Every day | ORAL | 6 refills | Status: DC

## 2015-12-28 MED ORDER — SULFAMETHOXAZOLE-TRIMETHOPRIM 800-160 MG PO TABS: 1.0000 | ORAL_TABLET | Freq: Two times a day (BID) | ORAL | 1 refills | Status: DC

## 2015-12-28 MED ORDER — MUPIROCIN CALCIUM 2 % EX CREA: 1.0000 "application " | TOPICAL_CREAM | Freq: Two times a day (BID) | CUTANEOUS | 1 refills | Status: DC

## 2015-12-28 MED ORDER — FLUOXETINE HCL 40 MG PO CAPS: 40.0000 mg | ORAL_CAPSULE | ORAL | 3 refills | Status: DC

## 2015-12-31 ENCOUNTER — Emergency Department
Admission: EM | Admit: 2015-12-31 | Discharge: 2015-12-31 | Disposition: A | Discharge status: Home / Self Care | Payer: Managed Care, Other (non HMO) | Attending: Emergency Medicine | Admitting: Emergency Medicine

## 2015-12-31 ENCOUNTER — Encounter: Payer: Self-pay | Admitting: Emergency Medicine

## 2015-12-31 ENCOUNTER — Emergency Department: Payer: Managed Care, Other (non HMO)

## 2015-12-31 DIAGNOSIS — Z87891 Personal history of nicotine dependence: Secondary | ICD-10-CM | POA: Insufficient documentation

## 2015-12-31 DIAGNOSIS — J45909 Unspecified asthma, uncomplicated: Secondary | ICD-10-CM | POA: Insufficient documentation

## 2015-12-31 DIAGNOSIS — L03112 Cellulitis of left axilla: Secondary | ICD-10-CM | POA: Insufficient documentation

## 2015-12-31 DIAGNOSIS — F1290 Cannabis use, unspecified, uncomplicated: Secondary | ICD-10-CM | POA: Diagnosis not present

## 2015-12-31 DIAGNOSIS — Z79899 Other long term (current) drug therapy: Secondary | ICD-10-CM | POA: Diagnosis not present

## 2015-12-31 DIAGNOSIS — R079 Chest pain, unspecified: Secondary | ICD-10-CM | POA: Diagnosis not present

## 2015-12-31 DIAGNOSIS — L539 Erythematous condition, unspecified: Secondary | ICD-10-CM | POA: Diagnosis present

## 2015-12-31 LAB — CBC WITH DIFFERENTIAL/PLATELET
BASOS ABS: 0 10*3/uL (ref 0–0.1)
Basophils Relative: 1 %
EOS ABS: 0.2 10*3/uL (ref 0–0.7)
Eosinophils Relative: 2 %
HCT: 36.7 % (ref 35.0–47.0)
HEMOGLOBIN: 12.2 g/dL (ref 12.0–16.0)
LYMPHS ABS: 1.6 10*3/uL (ref 1.0–3.6)
Lymphocytes Relative: 20 %
MCH: 29.3 pg (ref 26.0–34.0)
MCHC: 33.3 g/dL (ref 32.0–36.0)
MCV: 88.1 fL (ref 80.0–100.0)
Monocytes Absolute: 0.4 10*3/uL (ref 0.2–0.9)
Monocytes Relative: 6 %
NEUTROS PCT: 71 %
Neutro Abs: 5.7 10*3/uL (ref 1.4–6.5)
PLATELETS: 311 10*3/uL (ref 150–440)
RBC: 4.16 MIL/uL (ref 3.80–5.20)
RDW: 12.8 % (ref 11.5–14.5)
WBC: 8 10*3/uL (ref 3.6–11.0)

## 2015-12-31 LAB — COMPREHENSIVE METABOLIC PANEL
ALT: 14 U/L (ref 14–54)
AST: 18 U/L (ref 15–41)
Albumin: 4.1 g/dL (ref 3.5–5.0)
Alkaline Phosphatase: 86 U/L (ref 38–126)
Anion gap: 7 (ref 5–15)
BILIRUBIN TOTAL: 0.5 mg/dL (ref 0.3–1.2)
BUN: 12 mg/dL (ref 6–20)
CHLORIDE: 108 mmol/L (ref 101–111)
CO2: 24 mmol/L (ref 22–32)
CREATININE: 1.03 mg/dL — AB (ref 0.44–1.00)
Calcium: 9 mg/dL (ref 8.9–10.3)
Glucose, Bld: 86 mg/dL (ref 65–99)
POTASSIUM: 3.9 mmol/L (ref 3.5–5.1)
Sodium: 139 mmol/L (ref 135–145)
TOTAL PROTEIN: 7.3 g/dL (ref 6.5–8.1)

## 2015-12-31 LAB — POCT PREGNANCY, URINE: PREG TEST UR: NEGATIVE

## 2015-12-31 MED ORDER — CLINDAMYCIN PHOSPHATE 600 MG/50ML IV SOLN
600.0000 mg | Freq: Once | INTRAVENOUS | Status: AC
  Administered 2015-12-31: 600 mg via INTRAVENOUS
  Filled 2015-12-31: qty 50

## 2015-12-31 MED ORDER — SODIUM CHLORIDE 0.9 % IV BOLUS (SEPSIS)
1000.0000 mL | Freq: Once | INTRAVENOUS | Status: AC
  Administered 2015-12-31: 1000 mL via INTRAVENOUS

## 2015-12-31 MED ORDER — IOPAMIDOL (ISOVUE-300) INJECTION 61%
75.0000 mL | Freq: Once | INTRAVENOUS | Status: AC | PRN
  Administered 2015-12-31: 75 mL via INTRAVENOUS
  Filled 2015-12-31: qty 75

## 2015-12-31 MED ORDER — CLINDAMYCIN HCL 300 MG PO CAPS: 300.0000 mg | ORAL_CAPSULE | Freq: Four times a day (QID) | ORAL | 0 refills | Status: DC

## 2015-12-31 MED ORDER — HYDROCODONE-ACETAMINOPHEN 5-325 MG PO TABS: 1.0000 | ORAL_TABLET | ORAL | 0 refills | Status: DC | PRN

## 2015-12-31 MED ORDER — CLINDAMYCIN HCL 150 MG PO CAPS
1200.0000 mg | ORAL_CAPSULE | Freq: Once | ORAL | Status: AC
  Administered 2015-12-31: 1200 mg via ORAL
  Filled 2015-12-31: qty 8

## 2015-12-31 NOTE — ED Notes (Signed)
Pt returned from CT °

## 2015-12-31 NOTE — ED Triage Notes (Signed)
Pt with abscess to left axilla. Pt states she is on a antibiotic for same. Pt states symptoms began 5 days pta. Skin pwd, area of induration approx 3 inches to left upper axilla.

## 2015-12-31 NOTE — ED Notes (Signed)
Pt reports to ED w/ c/o abscess to L axilla, first appeared 5 days ago and she has been taking antibiotic for.  2" red raised area noted on L axilla, tender to palpation.

## 2015-12-31 NOTE — ED Provider Notes (Signed)
[facility] Emergency Department Provider Note  ____________________________________________  Time seen: Approximately 7:53 PM  I have reviewed the triage vital signs and the nursing notes.   HISTORY  Chief Complaint Abscess    HPI Rose [name] is a 25 y.o. female who presents emergency department complaining of abscess to the left axillary region. Patient states that in October of this previous year she had skin infection while pregnant. Patient was placed on oral antibiotics with complete resolution. Originally, symptoms were in the right axillary region. Patient states that this past week symptoms returned in the right axilla. She was seen by her physician and placed on oral antibiotics for which she is unsure of name. Patient states that there was no improvement with oral antibiotics and she has also developed a large, erythematous lesion to the left axilla. Patient denies any drainage. She reports pain "deep" into the axilla. She denies any fevers or chills, chest pain, shortness of breath, abdominal pain, nausea or vomiting. Patient has been consistent with her antibiotics with no missed doses. No other complaints at this time.   Past Medical History:  Diagnosis Date  . Asthma   . History of postpartum hemorrhage   . Hx of chlamydia infection    +12/22/11, TOC neg   . IBD (inflammatory bowel disease)    Chron's disease  . 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   . Personal history UTI     Patient Active Problem List   Diagnosis Date Noted  . Postpartum hemorrhage 11/14/2015  . Vaginal delivery 11/14/2015  . Labor and delivery indication for care or intervention 11/05/2015  . Tobacco abuse 05/20/2015  . Obesity (BMI 30.0-34.9) 05/20/2015  . Migraine with aura and without status migrainosus, not intractable 05/20/2015  . Left arm numbness     Past Surgical History:  Procedure Laterality  Date  . APPENDECTOMY  2013   acute appendicitis  . EYE SURGERY    . LAPAROSCOPIC APPENDECTOMY  02/03/2012   Procedure: APPENDECTOMY LAPAROSCOPIC;  Surgeon: Wilmon ArmsMatthew K. Corliss Skainssuei, MD;  Location: [facility] OR;  Service: General;  Laterality: N/A;  . lymph node biopsy     Left neck after cat scratch fever  . TYMPANOSTOMY TUBE PLACEMENT      Prior to Admission medications   Medication Sig Start Date End Date Taking? Authorizing Provider  albuterol (PROVENTIL HFA;VENTOLIN HFA) 108 (90 Base) MCG/ACT inhaler Inhale 2 puffs into the lungs every 6 (six) hours as needed for wheezing or shortness of breath.    Historical Provider, MD  cefixime (SUPRAX) 400 MG tablet Take 1 tablet (400 mg total) by mouth daily. Patient not taking: Reported on 12/28/2015 12/19/15   Melody N Shambley, CNM  clindamycin (CLEOCIN) 300 MG capsule Take 1 capsule (300 mg total) by mouth 4 (four) times daily. 12/31/15   Delorise RoyalsJonathan D Cuthriell, PA-C  docusate sodium (COLACE) 100 MG capsule Take 100 mg by mouth daily.    Historical Provider, MD  ferrous sulfate 325 (65 FE) MG tablet Take 1 tablet (325 mg total) by mouth 2 (two) times daily with a meal. 11/16/15   Hildred LaserAnika Cherry, MD  FLUoxetine (PROZAC) 40 MG capsule Take 1 capsule (40 mg total) by mouth every other day. Then increase to 40mg  daily. 12/28/15   Hildred LaserAnika Cherry, MD  HYDROcodone-acetaminophen (NORCO/VICODIN) 5-325 MG tablet Take 1 tablet by mouth every 4 (four) hours as needed for moderate pain. 12/31/15   Delorise RoyalsJonathan D Cuthriell,  PA-C  hydrocortisone-pramoxine (ANALPRAM HC) 2.5-1 % rectal cream Place 1 application rectally 3 (three) times daily. 12/19/15   Melody N Shambley, CNM  ibuprofen (ADVIL,MOTRIN) 800 MG tablet Take 1 tablet (800 mg total) by mouth every 8 (eight) hours as needed. 11/16/15   Hildred Laser, MD  mupirocin cream (BACTROBAN) 2 % Apply 1 application topically 2 (two) times daily. 12/28/15   Hildred Laser, MD  Prenatal Vit-Fe Fumarate-FA (PRENATAL VITAMIN) 27-0.8 MG TABS Take  1 tablet by mouth every morning.     Historical Provider, MD  sulfamethoxazole-trimethoprim (BACTRIM DS,SEPTRA DS) 800-160 MG tablet Take 1 tablet by mouth 2 (two) times daily. 12/28/15   Hildred Laser, MD  SUMAtriptan (IMITREX) 100 MG tablet Take 1 tablet (100 mg total) by mouth once as needed for migraine. May repeat in 2 hours if headache persists or recurs. 12/19/15   Melody N Shambley, CNM    Allergies Penicillins  Family History  Problem Relation Age of Onset  . COPD Mother   . Asthma Mother   . Lung cancer Maternal Grandmother     Social History Social History  Substance Use Topics  . Smoking status: Former Smoker    Types: Cigarettes    Quit date: 06/08/2015  . Smokeless tobacco: Never Used     Comment: One pack per week.  . Alcohol use No     Comment: Occasion use - socially     Review of Systems  Constitutional: No fever/chills Eyes: No visual changes.  Cardiovascular: no chest pain. Respiratory: no cough. No SOB. Gastrointestinal: No abdominal pain.  No nausea, no vomiting.  No diarrhea.  No constipation. Musculoskeletal: Negative for musculoskeletal pain. Skin: Negative for rash, abrasions, lacerations, ecchymosis.Positive for "abscess" to the left axilla. Neurological: Negative for headaches, focal weakness or numbness. 10-point ROS otherwise negative.  ____________________________________________   PHYSICAL EXAM:  VITAL SIGNS: ED Triage Vitals  Enc Vitals Group     BP 12/31/15 1929 129/69     Pulse Rate 12/31/15 1929 62     Resp 12/31/15 1929 16     Temp 12/31/15 1929 98.1 F (36.7 C)     Temp Source 12/31/15 1929 Oral     SpO2 12/31/15 1929 99 %     Weight 12/31/15 1930 211 lb (95.7 kg)     Height 12/31/15 1930 5\' 8"  (1.727 m)     Head Circumference --      Peak Flow --      Pain Score 12/31/15 1931 7     Pain Loc --      Pain Edu? --      Excl. in GC? --      Constitutional: Alert and oriented. Well appearing and in no acute  distress. Eyes: Conjunctivae are normal. PERRL. EOMI. Head: Atraumatic. Neck: No stridor.   Hematological/Lymphatic/Immunilogical: No cervical lymphadenopathy. Cardiovascular: Normal rate, regular rhythm. Normal S1 and S2.  Good peripheral circulation. Respiratory: Normal respiratory effort without tachypnea or retractions. Lungs CTAB. Good air entry to the bases with no decreased or absent breath sounds. Gastrointestinal: Bowel sounds 4 quadrants. Soft and nontender to palpation. No guarding or rigidity. No palpable masses. No distention Musculoskeletal: Full range of motion to all extremities. No gross deformities appreciated. Neurologic:  Normal speech and language. No gross focal neurologic deficits are appreciated.  Skin:  Skin is warm, dry and intact. No rash noted.Erythematous and edematous skin lesion noted to the left axillary region. Mild induration. No fluctuance. No drainage appreciated. Abdomen is plus erythematous with  region is approximately 8 cm in diameter. No axillary lymphadenopathy identified. Patient is tender deep into axilla with no palpable abnormality. Psychiatric: Mood and affect are normal. Speech and behavior are normal. Patient exhibits appropriate insight and judgement.   ____________________________________________   LABS (all labs ordered are listed, but only abnormal results are displayed)  Labs Reviewed  COMPREHENSIVE METABOLIC PANEL - Abnormal; Notable for the following:       Result Value   Creatinine, Ser 1.03 (*)    All other components within normal limits  CULTURE, BLOOD (ROUTINE X 2)  CULTURE, BLOOD (ROUTINE X 2)  CBC WITH DIFFERENTIAL/PLATELET  POC URINE PREG, ED  POCT PREGNANCY, URINE   ____________________________________________  EKG   ____________________________________________  RADIOLOGY Festus Barren Cuthriell, personally viewed and evaluated these images (plain radiographs) as part of my medical decision making, as well as  reviewing the written report by the radiologist.  Ct Chest W Contrast  Result Date: 12/31/2015 CLINICAL DATA:  Left axillary abscess.  Left chest pain. EXAM: CT CHEST WITH CONTRAST TECHNIQUE: Multidetector CT imaging of the chest was performed during intravenous contrast administration. CONTRAST:  75mL ISOVUE-300 IOPAMIDOL (ISOVUE-300) INJECTION 61% COMPARISON:  None. FINDINGS: Cardiovascular: Upper normal heart size. Mediastinum/Nodes: Expected thymic tissue anteriorly. No adenopathy. Lungs/Pleura: Unremarkable Upper Abdomen: Unremarkable Musculoskeletal: There is some asymmetric skin thickening in the left axilla. I do not see a definite fluid collection. There is some mildly asymmetric subcutaneous edema in the left axilla which could be from cellulitis. Left axillary lymph nodes measure up to 1.3 cm in short axis. Right axillary lymph nodes measure up to 1.1 cm in short axis. No significant asymmetry of breast glandular tissues. IMPRESSION: 1. Asymmetric skin thickening and subcutaneous stranding along the left axilla, likely from cellulitis, and remaining localized to the axilla. I do not see a drainable abscess. Reactive left axillary lymph nodes measuring up to 1.3 cm in short axis. Electronically Signed   By: Gaylyn Rong M.D.   On: 12/31/2015 21:38    ____________________________________________    PROCEDURES  Procedure(s) performed:    Procedures    Medications  sodium chloride 0.9 % bolus 1,000 mL (0 mLs Intravenous Stopped 12/31/15 2204)  iopamidol (ISOVUE-300) 61 % injection 75 mL (75 mLs Intravenous Contrast Given 12/31/15 2101)  clindamycin (CLEOCIN) IVPB 600 mg (0 mg Intravenous Stopped 12/31/15 2234)  clindamycin (CLEOCIN) capsule 1,200 mg (1,200 mg Oral Given 12/31/15 2315)     ____________________________________________   INITIAL IMPRESSION / ASSESSMENT AND PLAN / ED COURSE  Pertinent labs & imaging results that were available during my care of the patient  were reviewed by me and considered in my medical decision making (see chart for details).  Review of the [place] CSRS was performed in accordance of the [facility] prior to dispensing any controlled drugs.  Clinical Course     Patient's diagnosis is consistent with Cellulitis to the left axilla. Patient presented to the emergency department with worsening skin infection while on oral antibiotics. While there was no appreciable abscess on inspection, due to patient's complaints and symptoms of pain extending deep into the axilla, labs and CT scan were ordered. These returned with reassuring results with no crease leukocytosis and no tract or drainable abscess. At this time, patient is given IV antibiotics and will be discharged home with prescription for clindamycin.. Patient will follow-up with primary care as needed. Patient is given ED precautions to return to the ED for any worsening or new symptoms.     ____________________________________________  FINAL CLINICAL IMPRESSION(S) / ED DIAGNOSES  Final diagnoses:  Cellulitis of left axilla      NEW MEDICATIONS STARTED DURING THIS VISIT:  Discharge Medication List as of 12/31/2015 11:12 PM    START taking these medications   Details  clindamycin (CLEOCIN) 300 MG capsule Take 1 capsule (300 mg total) by mouth 4 (four) times daily., Starting Sun 12/31/2015, Print    HYDROcodone-acetaminophen (NORCO/VICODIN) 5-325 MG tablet Take 1 tablet by mouth every 4 (four) hours as needed for moderate pain., Starting Sun 12/31/2015, Print            This chart was dictated using voice recognition software/Dragon. Despite best efforts to proofread, errors can occur which can change the meaning. Any change was purely unintentional.    Racheal PatchesJonathan D Cuthriell, PA-C 12/31/15 2353    Phineas SemenGraydon Goodman, MD 01/01/16 [phone]

## 2015-12-31 NOTE — ED Notes (Signed)
Patient transported to CT 

## 2016-01-04 ENCOUNTER — Other Ambulatory Visit: Payer: Self-pay

## 2016-01-04 DIAGNOSIS — L0293 Carbuncle, unspecified: Secondary | ICD-10-CM

## 2016-01-04 MED ORDER — MUPIROCIN 2 % EX OINT
1.0000 | TOPICAL_OINTMENT | Freq: Two times a day (BID) | CUTANEOUS | 1 refills | Status: DC
Start: 2016-01-04 — End: 2016-01-11

## 2016-01-05 LAB — CULTURE, BLOOD (ROUTINE X 2)
CULTURE: NO GROWTH
Culture: NO GROWTH

## 2016-01-11 ENCOUNTER — Encounter: Payer: Self-pay | Admitting: Obstetrics and Gynecology

## 2016-01-11 ENCOUNTER — Ambulatory Visit (INDEPENDENT_AMBULATORY_CARE_PROVIDER_SITE_OTHER): Payer: Medicaid Other | Admitting: Obstetrics and Gynecology

## 2016-01-11 VITALS — BP 103/68 | HR 72 | Ht 68.0 in | Wt 220.5 lb

## 2016-01-11 DIAGNOSIS — F53 Postpartum depression: Secondary | ICD-10-CM

## 2016-01-11 DIAGNOSIS — L732 Hidradenitis suppurativa: Secondary | ICD-10-CM

## 2016-01-11 DIAGNOSIS — O99345 Other mental disorders complicating the puerperium: Secondary | ICD-10-CM

## 2016-01-11 DIAGNOSIS — Z30017 Encounter for initial prescription of implantable subdermal contraceptive: Secondary | ICD-10-CM

## 2016-01-11 LAB — POCT URINE PREGNANCY: Preg Test, Ur: NEGATIVE

## 2016-01-11 NOTE — Progress Notes (Signed)
    GYNECOLOGY PROGRESS NOTE  Subjective:    Patient ID: Rose [name], female    DOB: 05/05/1990, 26 y.o.   MRN: [id number]  HPI  Patient is a 26 y.o. 22P2002 female who presents for 2 week f/u of postpartum depression, and for Nexplanon insertion.  Patient was placed on Prozac for depression symptoms and referred for counseling.  She states that she is doing much better since her last visit.  Notes that the medication is helping. No longer feels as agitated, or tearful.  Is motivated now to do daily activities.   Patient also reports that her hydradenitis has also improved with the use of the topical and oral antibiotics prescribed last visit.   The following portions of the patient's history were reviewed and updated as appropriate: allergies, current medications, past family history, past medical history, past social history, past surgical history and problem list.  Review of Systems Pertinent items noted in HPI and remainder of comprehensive ROS otherwise negative.   Objective:   Blood pressure 103/68, pulse 72, height 5\' 8"  (1.727 m), weight 220 lb 8 oz (100 kg), last menstrual period 12/28/2015, not currently breastfeeding. General appearance: alert and no distress Psychologic: normal thoughts, normal affect, behavior appropriate. Remainder of exam deferred.   Assessment:   Postpartum depression Hydradenitis Nexplanon insertion   Plan:   1. Postpartum depression.  Patient doing much better on medications, notes symptoms have improved significantly.  Will continue on Prozac 50 mg daily.  Will f/u in 3 months at annual exam.  To return sooner if needed.  Continue psychotherapy.   2. Hydradenitis much improved with antibiotics. No further follow up needed at this time.   3. Nexplanon insertion, see procedure note below.     GYNECOLOGY OFFICE PROCEDURE NOTE  Rose [name] is a 26 y.o. [id number] here for Nexplanon insertion.  Last pap smear was on 07/2014 and was  normal.  No other gynecologic concerns.  Nexplanon Insertion Procedure Patient identified, informed consent performed, consent signed.   Patient does understand that irregular bleeding is a very common side effect of this medication. She was advised to have backup contraception for one week after placement. Pregnancy test in clinic today was negative.  Appropriate time out taken.  Patient's left arm was prepped and draped in the usual sterile fashion. The ruler used to measure and mark insertion area.  Patient was prepped with alcohol swab and then injected with 3 ml of 1% lidocaine.  She was prepped with betadine, Nexplanon removed from packaging,  Device confirmed in needle, then inserted full length of needle and withdrawn per handbook instructions. Nexplanon was able to palpated in the patient's arm; patient palpated the insert herself. There was minimal blood loss.  Patient insertion site covered with guaze and a pressure bandage to reduce any bruising.  The patient tolerated the procedure well and was given post procedure instructions.    Exp: 03/2018 Lot: [id number]    Hildred LaserAnika Madelin Weseman, MD [facility]

## 2016-01-12 ENCOUNTER — Other Ambulatory Visit: Payer: Self-pay

## 2016-01-12 DIAGNOSIS — L0293 Carbuncle, unspecified: Secondary | ICD-10-CM

## 2016-01-12 MED ORDER — MUPIROCIN 2 % EX OINT
1.0000 | TOPICAL_OINTMENT | Freq: Two times a day (BID) | CUTANEOUS | 0 refills | Status: DC
Start: 2016-01-12 — End: 2016-04-18

## 2016-02-11 IMAGING — MR MR HEAD W/O CM
9 of 11 series · 34 of 48 positions shown · non-contrast
Comparison: CT of the head April 26, 2014 at 2157 hours

CLINICAL DATA: Migraine headache beginning at 3 p.m. today, with
LEFT arm numbness and weakness.

EXAM:
MRI HEAD WITHOUT CONTRAST
TECHNIQUE: Multiplanar, multiecho pulse sequences of the brain and surrounding
structures were obtained without intravenous contrast.

[Series 2: FLAIR · sagittal · 5.0mm · 0.47mm/px · 2 of 23 slices shown (1 of 2)]
[im 1/23]
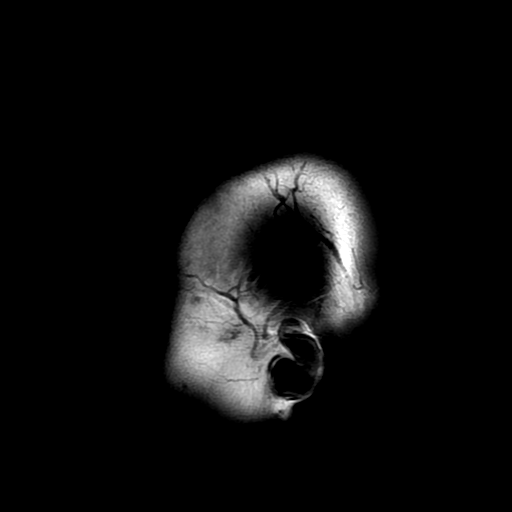
[im 23/23]
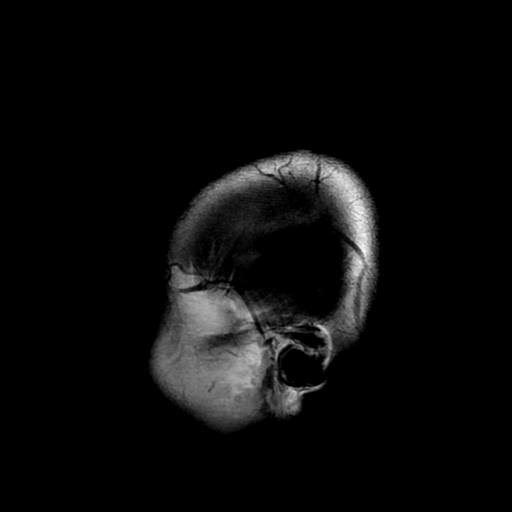

[Series 4: DWI · axial · 3.0mm · 0.94mm/px · z∈[-81,+66]mm · 9 of 100 slices shown (1 of 4)]
[im 1/100]
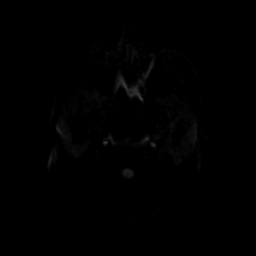
[im 13/100]
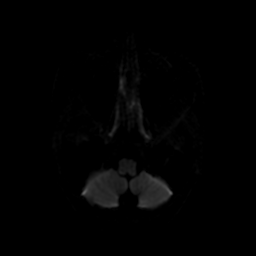
[im 25/100]
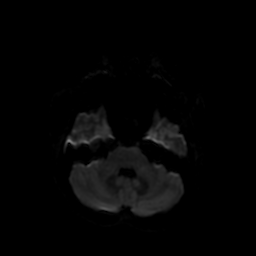
[im 38/100]
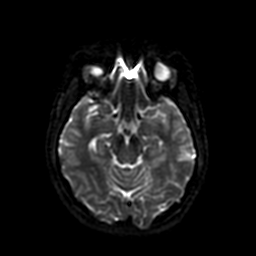
[im 50/100]
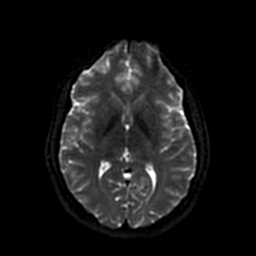
[im 62/100]
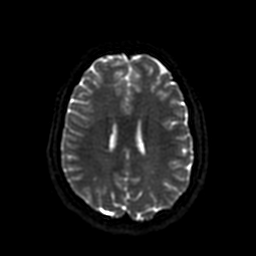
[im 75/100]
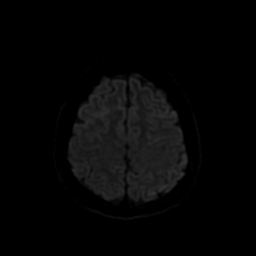
[im 87/100]
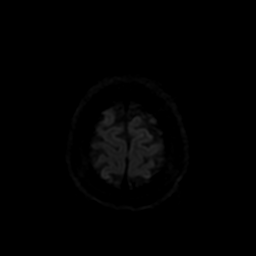
[im 100/100]
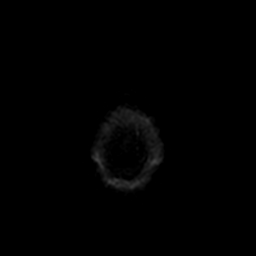

[Series 5: T2 · axial · 5.0mm · 0.47mm/px · z∈[-80,+64]mm · 2 of 25 slices shown (1 of 2)]
[im 1/25]
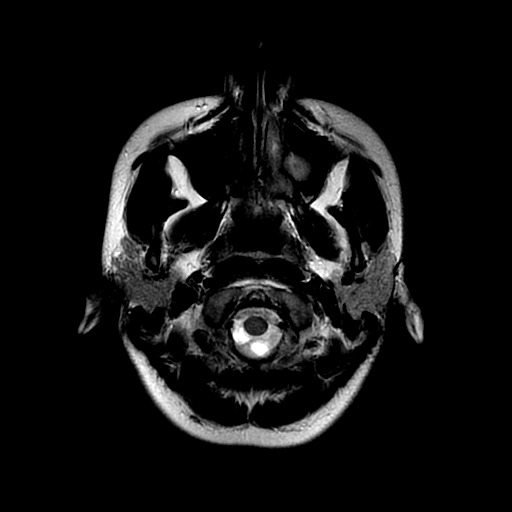
[im 25/25]
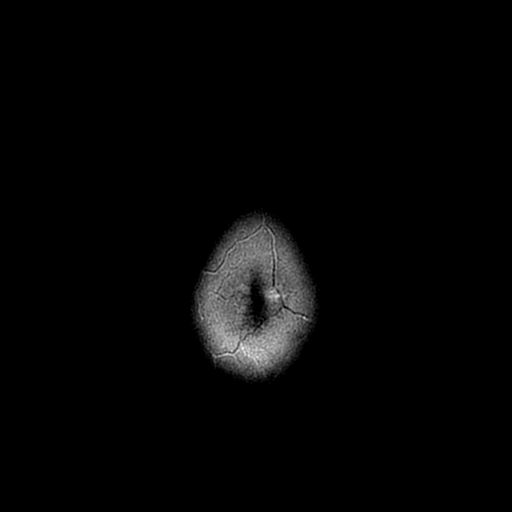

[Series 6: FLAIR · axial · 5.0mm · 0.47mm/px · z∈[-80,+64]mm · 2 of 25 slices shown (2 of 2)]
[im 1/25]
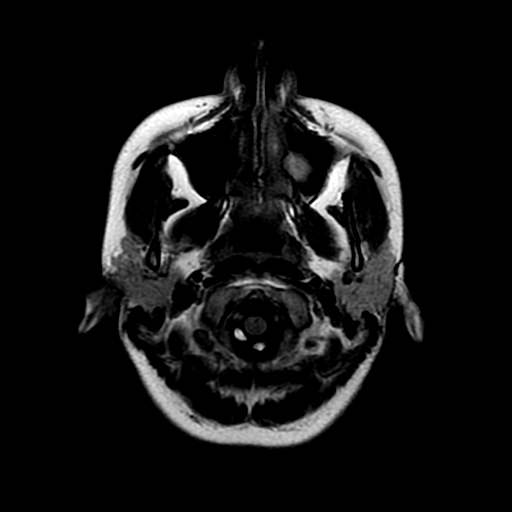
[im 25/25]
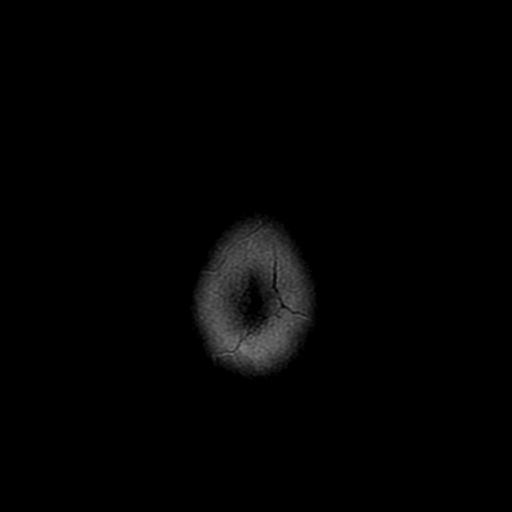

[Series 8: DWI · coronal · 5.0mm · 0.94mm/px · 6 of 64 slices shown (2 of 4)]
[im 1/64]
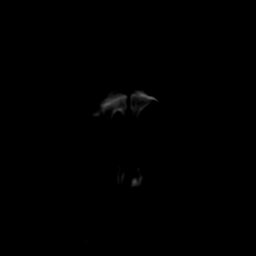
[im 13/64]
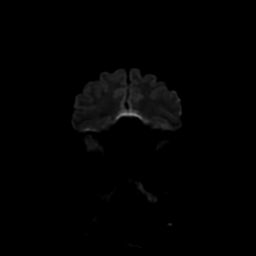
[im 26/64]
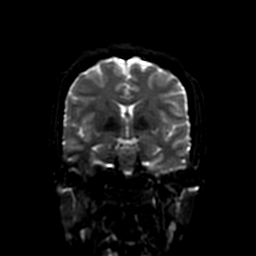
[im 38/64]
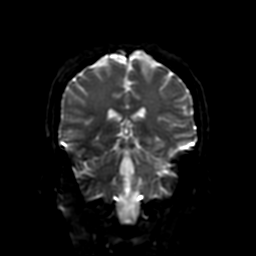
[im 51/64]
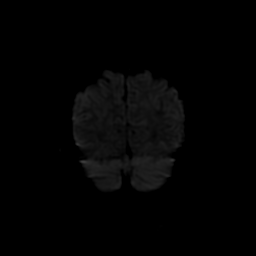
[im 64/64]
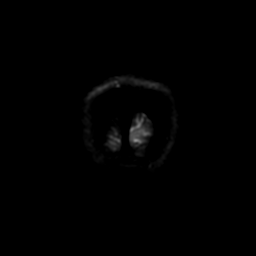

[Series 9: (person_name) · axial · 3.0mm · 0.47mm/px · z∈[-81,-26]mm · 4 of 100 slices shown]
[im 1/100]
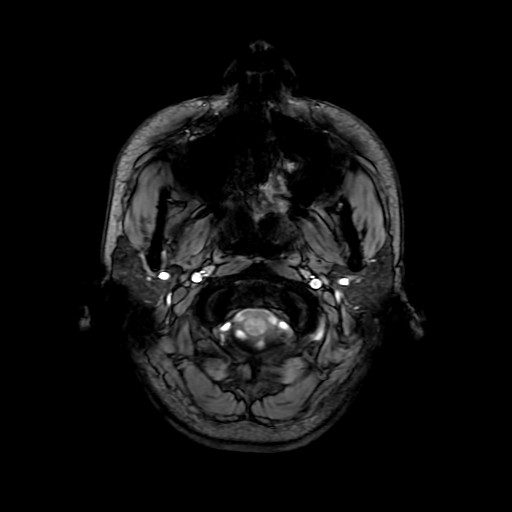
[im 13/100]
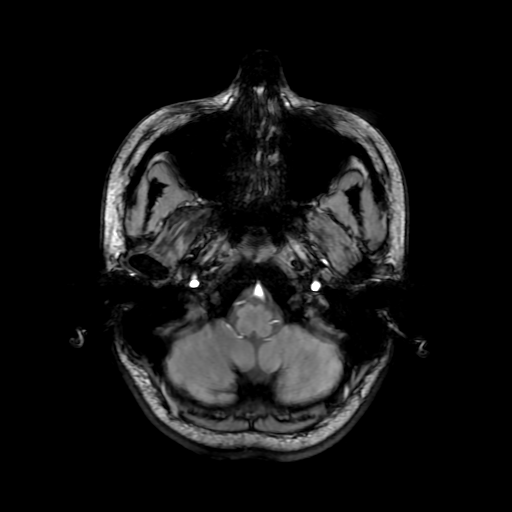
[im 25/100]
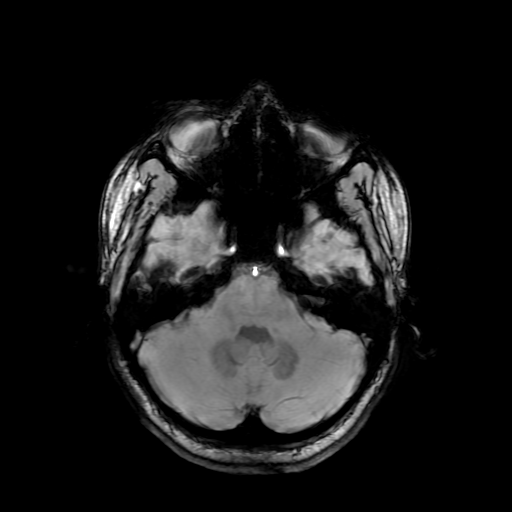
[im 38/100]
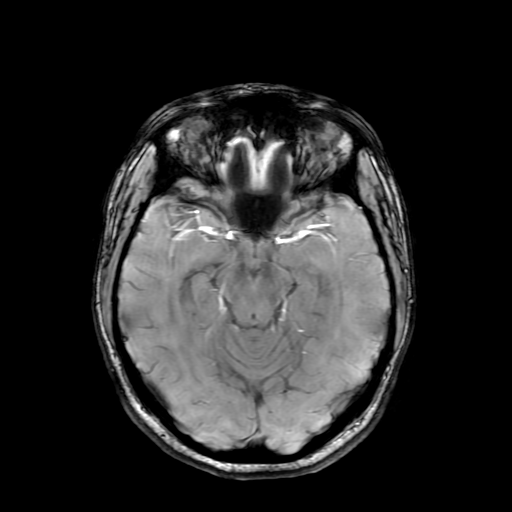

[Series 11: T2 · coronal · 5.0mm · 0.39mm/px · 2 of 27 slices shown (2 of 2)]
[im 1/27]
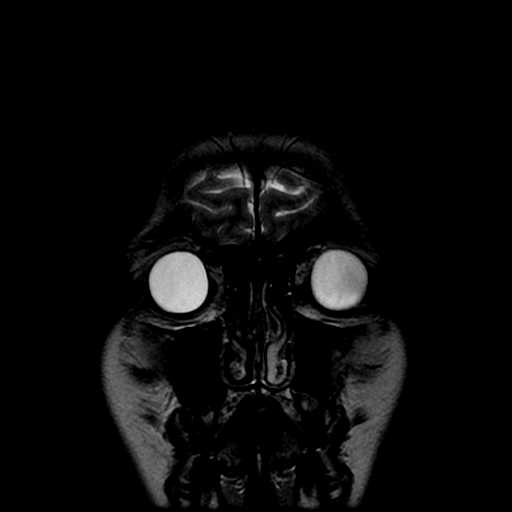
[im 27/27]
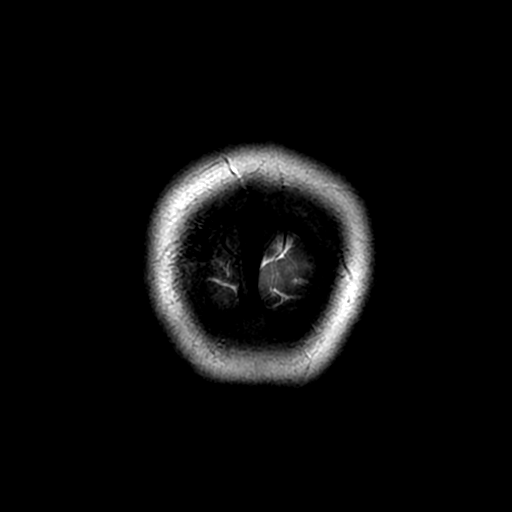

[Series 400: DWI · axial · 3.0mm · 0.94mm/px · z∈[-81,+66]mm · 4 of 50 slices shown (3 of 4)]
[im 1/50]
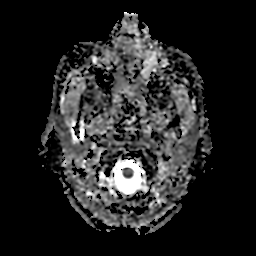
[im 17/50]
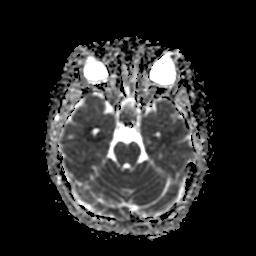
[im 33/50]
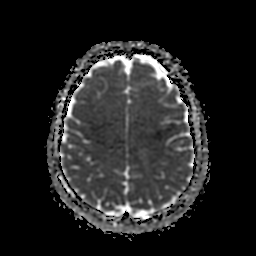
[im 50/50]
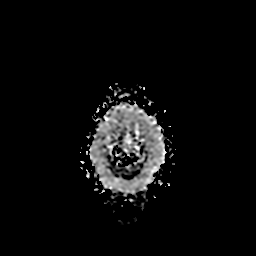

[Series 800: DWI · coronal · 5.0mm · 0.94mm/px · 3 of 32 slices shown (4 of 4)]
[im 1/32]
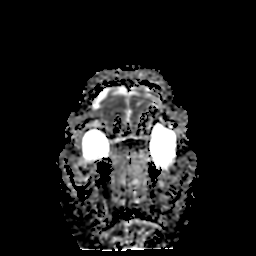
[im 16/32]
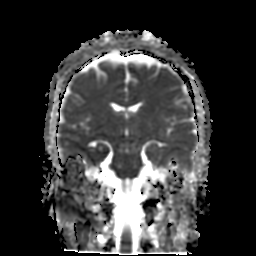
[im 32/32]
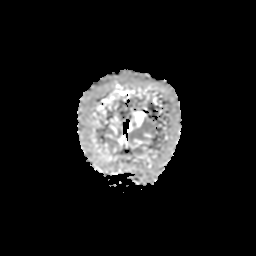

[34 of 48 positions shown; findings below may reference images not displayed]

FINDINGS: The ventricles and sulci are normal for patient's age. No abnormal
parenchymal signal, mass lesions, mass effect. No reduced diffusion
to suggest acute ischemia. No susceptibility artifact to suggest
hemorrhage.

No abnormal extra-axial fluid collections. No extra-axial masses
though, contrast enhanced sequences would be more sensitive. Normal
major intracranial vascular flow voids seen at the skull base.

Ocular globes and orbital contents are unremarkable though not
tailored for evaluation. No abnormal sellar expansion. Partially
imaged LEFT maxillary sinus mucosal retention cyst without paranasal
sinus air-fluid levels. The mastoid air cells are well aerated. No
suspicious calvarial bone marrow signal. No abnormal sellar
expansion. Craniocervical junction maintained.
IMPRESSION: Normal noncontrast MRI of the brain.

By: Drey Jim

## 2016-04-18 ENCOUNTER — Ambulatory Visit (INDEPENDENT_AMBULATORY_CARE_PROVIDER_SITE_OTHER): Payer: Medicaid Other | Admitting: Obstetrics and Gynecology

## 2016-04-18 ENCOUNTER — Encounter: Payer: Self-pay | Admitting: Obstetrics and Gynecology

## 2016-04-18 VITALS — BP 118/79 | HR 87 | Ht 68.0 in | Wt 226.0 lb

## 2016-04-18 DIAGNOSIS — R42 Dizziness and giddiness: Secondary | ICD-10-CM

## 2016-04-18 DIAGNOSIS — Z0001 Encounter for general adult medical examination with abnormal findings: Secondary | ICD-10-CM | POA: Diagnosis not present

## 2016-04-18 DIAGNOSIS — Z975 Presence of (intrauterine) contraceptive device: Secondary | ICD-10-CM

## 2016-04-18 DIAGNOSIS — N921 Excessive and frequent menstruation with irregular cycle: Secondary | ICD-10-CM

## 2016-04-18 DIAGNOSIS — Z978 Presence of other specified devices: Secondary | ICD-10-CM

## 2016-04-18 DIAGNOSIS — Z01419 Encounter for gynecological examination (general) (routine) without abnormal findings: Secondary | ICD-10-CM

## 2016-04-18 DIAGNOSIS — Z720 Tobacco use: Secondary | ICD-10-CM

## 2016-04-18 MED ORDER — MEFENAMIC ACID 250 MG PO CAPS: 500.0000 mg | ORAL_CAPSULE | Freq: Two times a day (BID) | ORAL | 0 refills | Status: AC

## 2016-04-18 NOTE — Progress Notes (Signed)
GYNECOLOGY ANNUAL PHYSICAL EXAM PROGRESS NOTE  Subjective:    Rose [name] is a 26 y.o. G25P2002 female who presents for an annual exam.  The patient is sexually active.  The patient wears seatbelts: yes. The patient participates in regular exercise: no. Has the patient ever been transfused or tattooed?: no. The patient reports that there is not domestic violence in her life.   Of note, patient was also diagnosed with postpartum depression several months ago.  Notes that she never took the prescribed medications as she realized it was not so much depression, that she was having a hard time dealing with the fact that her FOB had resumed drug use and was hiding it from her.  Patient notes that after she found proof and confronted him and talked with her family, she has been doing better.  The patient has the following complaints today: 1. Notes continuous breakthrough bleeding on Nexplanon since it's insertion in January.  States that last week she was changing a tampon every 20 minutes.  The bleeding alternates from light spotting to heavy bleeding.    Gynecologic History  Menarche age: 51 Patient's last menstrual period was 04/18/2016. Contraception: Nexplanon History of STI's: History of chlamydia (2013) Last Pap: 07/2015. Results were: normal.  Denies h/o abnormal pap smears.   Obstetric History   G2   P2   T2   P0   A0   L2    SAB0   TAB0   Ectopic0   Multiple0   Live Births2     # Outcome Date GA Lbr Len/2nd Weight Sex Delivery Anes PTL Lv  2 Term 11/14/15 [redacted]w[redacted]d / 00:12 8 lb 10.3 oz (3.92 kg) M Vag-Spont EPI  LIV     Name: [name],[name]     Apgar1:  9                Apgar5: 9  1 Term 2011 [redacted]w[redacted]d   M Vag-Spont None N LIV    Obstetric Comments  Postpartum hemorrhage after 1st delivery, did not require blood transfusion.     Past Medical History:  Diagnosis Date  . Asthma   . History of postpartum hemorrhage   . Hx of chlamydia infection    +12/22/11, TOC  neg   . IBD (inflammatory bowel disease)    Chron's disease  . 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   . Personal history UTI     Past Surgical History:  Procedure Laterality Date  . APPENDECTOMY  2013   acute appendicitis  . EYE SURGERY    . LAPAROSCOPIC APPENDECTOMY  02/03/2012   Procedure: APPENDECTOMY LAPAROSCOPIC;  Surgeon: Wilmon Arms. Corliss Skains, MD;  Location: [facility] OR;  Service: General;  Laterality: N/A;  . lymph node biopsy     Left neck after cat scratch fever  . TYMPANOSTOMY TUBE PLACEMENT      Family History  Problem Relation Age of Onset  . COPD Mother   . Asthma Mother   . Lung cancer Maternal Grandmother     Social History   Social History  . Marital status: Single    Spouse name: N/A  . Number of children: 1  . Years of education: 29   Occupational History  . 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 Smoking status: Former Smoker    Types: Cigarettes    Quit date: 06/08/2015  . Smokeless tobacco: Never Used  Comment: One pack per week.  . Alcohol use No     Comment: Occasion use - socially  . Drug use: Yes    Types: Marijuana  . Sexual activity: Yes    Birth control/ protection: Implant   Other Topics Concern  . Not on file   Social History Narrative   Lives at home with her son.   Right-handed.   6-8 cups caffeine per day.    Current Outpatient Prescriptions on File Prior to Visit  Medication Sig Dispense Refill  . [DISCONTINUED] clidinium-chlordiazePOXIDE (LIBRAX) 5-2.5 MG per capsule Take 1 capsule by mouth 3 (three) times daily before meals. (Patient not taking: Reported on 02/16/2014) 60 capsule 3  . [DISCONTINUED] omeprazole (PRILOSEC) 20 MG capsule Take one capsule PO twice a day for 3 days, then one capsule PO once a day. (Patient not taking: Reported on 04/26/2014) 20 capsule 0   No current facility-administered medications on file prior to visit.     Allergies  Allergen  Reactions  . Penicillins Hives    Has patient had a PCN reaction causing immediate rash, facial/tongue/throat swelling, SOB or lightheadedness with hypotension: Yes Has patient had a PCN reaction causing severe rash involving mucus membranes or skin necrosis: No Has patient had a PCN reaction that required hospitalization Yes Has patient had a PCN reaction occurring within the last 10 years: No If all of the above answers are "NO", then may proceed with Cephalosporin use.       Review of Systems Constitutional: negative for chills, fatigue, fevers and sweats Eyes: negative for irritation, redness and visual disturbance Ears, nose, mouth, throat, and face: negative for hearing loss, nasal congestion, snoring and tinnitus Respiratory: negative for asthma, cough, sputum Cardiovascular: negative for chest pain, dyspnea, exertional chest pressure/discomfort, irregular heart beat, palpitations and syncope Gastrointestinal: negative for abdominal pain, change in bowel habits, nausea and vomiting Genitourinary: negative for abnormal menstrual periods, genital lesions, sexual problems and vaginal discharge, dysuria and urinary incontinence Integument/breast: negative for breast lump, breast tenderness and nipple discharge Hematologic/lymphatic: negative for bleeding and easy bruising Musculoskeletal:negative for back pain and muscle weakness Neurological: positive for dizziness.  Negative for headaches, vertigo and weakness Endocrine: negative for diabetic symptoms including polydipsia, polyuria and skin dryness Allergic/Immunologic: negative for hay fever and urticaria        Objective:  Blood pressure 118/79, pulse 87, height  (1.727 m), weight 226 lb (102.5 kg), last menstrual period 04/18/2016, not currently breastfeeding. Body mass index is 34.36 kg/m.  General Appearance:    Alert, cooperative, no distress, appears stated age  Head:    Normocephalic, without obvious abnormality,  atraumatic  Eyes:    PERRL, conjunctiva/corneas clear, EOM's intact, both eyes  Ears:    Normal external ear canals, both ears  Nose:   Nares normal, septum midline, mucosa normal, no drainage or sinus tenderness  Throat:   Lips, mucosa, and tongue normal; teeth and gums normal  Neck:   Supple, symmetrical, trachea midline, no adenopathy; thyroid: no enlargement/tenderness/nodules; no carotid bruit or JVD  Back:     Symmetric, no curvature, ROM normal, no CVA tenderness  Lungs:     Clear to auscultation bilaterally, respirations unlabored  Chest Wall:    No tenderness or deformity   Heart:    Regular rate and rhythm, S1 and S2 normal, no murmur, rub or gallop  Breast Exam:    No tenderness, masses, or nipple abnormality  Abdomen:     Soft, non-tender, bowel sounds  active all four quadrants, no masses, no organomegaly.    Genitalia:    Pelvic:external genitalia normal, vagina without lesions, discharge, or tenderness, rectovaginal septum  normal. Cervix normal in appearance, no cervical motion tenderness, no adnexal masses or tenderness.  Uterus normal size, shape, mobile, regular contours, nontender.  Rectal:    Normal external sphincter.  No hemorrhoids appreciated. Internal exam not done.   Extremities:   Extremities normal, atraumatic, no cyanosis or edema  Pulses:   2+ and symmetric all extremities  Skin:   Skin color, texture, turgor normal, no rashes or lesions  Lymph nodes:   Cervical, supraclavicular, and axillary nodes normal  Neurologic:   CNII-XII intact, normal strength, sensation and reflexes throughout   .  Labs:  Lab Results  Component Value Date   WBC 8.0 12/31/2015   HGB 12.2 12/31/2015   HCT 36.7 12/31/2015   MCV 88.1 12/31/2015   PLT 311 12/31/2015    Lab Results  Component Value Date   CREATININE 1.03 (H) 12/31/2015   BUN 12 12/31/2015   NA 139 12/31/2015   K 3.9 12/31/2015   CL 108 12/31/2015   CO2 24 12/31/2015    Lab Results  Component Value Date    ALT 14 12/31/2015   AST 18 12/31/2015   ALKPHOS 86 12/31/2015   BILITOT 0.5 12/31/2015    Lab Results  Component Value Date   TSH 1.430 04/03/2015     Assessment:    Healthy female exam.   Breakthrough bleeding on Nexplanon Tobacco abuse  Plan:     Blood tests: CBC with diff to assess for presence of anemia in light of persistent abnormal bleeding on Nexplanon.  Breast self exam technique reviewed and patient encouraged to perform self-exam monthly. Contraception: Nexplanon.  Discussed options for management of breakthrough bleeding.  Patient cannot take estrogen products due to worsening of migraines. Will prescribe mefanamic acid c 5 days.  Counseled on smoking cessation.  Discussed healthy lifestyle modifications. Follow up in 1 year for annual exam, as needed.    Hildred Laser, MD [facility]

## 2016-04-18 NOTE — Patient Instructions (Signed)
Health Maintenance, Female Adopting a healthy lifestyle and getting preventive care can go a long way to promote health and wellness. Talk with your health care provider about what schedule of regular examinations is right for you. This is a good chance for you to check in with your provider about disease prevention and staying healthy. In between checkups, there are plenty of things you can do on your own. Experts have done a lot of research about which lifestyle changes and preventive measures are most likely to keep you healthy. Ask your health care provider for more information. Weight and diet Eat a healthy diet  Be sure to include plenty of vegetables, fruits, low-fat dairy products, and lean protein.  Do not eat a lot of foods high in solid fats, added sugars, or salt.  Get regular exercise. This is one of the most important things you can do for your health.  Most adults should exercise for at least 150 minutes each week. The exercise should increase your heart rate and make you sweat (moderate-intensity exercise).  Most adults should also do strengthening exercises at least twice a week. This is in addition to the moderate-intensity exercise. Maintain a healthy weight  Body mass index (BMI) is a measurement that can be used to identify possible weight problems. It estimates body fat based on height and weight. Your health care provider can help determine your BMI and help you achieve or maintain a healthy weight.  For females 76 years of age and older:  A BMI below 18.5 is considered underweight.  A BMI of 18.5 to 24.9 is normal.  A BMI of 25 to 29.9 is considered overweight.  A BMI of 30 and above is considered obese. Watch levels of cholesterol and blood lipids  You should start having your blood tested for lipids and cholesterol at 25 years of age, then have this test every 5 years.  You may need to have your cholesterol levels checked more often if:  Your lipid or  cholesterol levels are high.  You are older than 25 years of age.  You are at high risk for heart disease. Cancer screening Lung Cancer  Lung cancer screening is recommended for adults 64-42 years old who are at high risk for lung cancer because of a history of smoking.  A yearly low-dose CT scan of the lungs is recommended for people who:  Currently smoke.  Have quit within the past 15 years.  Have at least a 30-pack-year history of smoking. A pack year is smoking an average of one pack of cigarettes a day for 1 year.  Yearly screening should continue until it has been 15 years since you quit.  Yearly screening should stop if you develop a health problem that would prevent you from having lung cancer treatment. Breast Cancer  Practice breast self-awareness. This means understanding how your breasts normally appear and feel.  It also means doing regular breast self-exams. Let your health care provider know about any changes, no matter how small.  If you are in your 20s or 30s, you should have a clinical breast exam (CBE) by a health care provider every 1-3 years as part of a regular health exam.  If you are 34 or older, have a CBE every year. Also consider having a breast X-ray (mammogram) every year.  If you have a family history of breast cancer, talk to your health care provider about genetic screening.  If you are at high risk for breast cancer, talk  to your health care provider about having an MRI and a mammogram every year.  Breast cancer gene (BRCA) assessment is recommended for women who have family members with BRCA-related cancers. BRCA-related cancers include:  Breast.  Ovarian.  Tubal.  Peritoneal cancers.  Results of the assessment will determine the need for genetic counseling and BRCA1 and BRCA2 testing. Cervical Cancer  Your health care provider may recommend that you be screened regularly for cancer of the pelvic organs (ovaries, uterus, and vagina).  This screening involves a pelvic examination, including checking for microscopic changes to the surface of your cervix (Pap test). You may be encouraged to have this screening done every 3 years, beginning at age 24.  For women ages 66-65, health care providers may recommend pelvic exams and Pap testing every 3 years, or they may recommend the Pap and pelvic exam, combined with testing for human papilloma virus (HPV), every 5 years. Some types of HPV increase your risk of cervical cancer. Testing for HPV may also be done on women of any age with unclear Pap test results.  Other health care providers may not recommend any screening for nonpregnant women who are considered low risk for pelvic cancer and who do not have symptoms. Ask your health care provider if a screening pelvic exam is right for you.  If you have had past treatment for cervical cancer or a condition that could lead to cancer, you need Pap tests and screening for cancer for at least 20 years after your treatment. If Pap tests have been discontinued, your risk factors (such as having a new sexual partner) need to be reassessed to determine if screening should resume. Some women have medical problems that increase the chance of getting cervical cancer. In these cases, your health care provider may recommend more frequent screening and Pap tests. Colorectal Cancer  This type of cancer can be detected and often prevented.  Routine colorectal cancer screening usually begins at 25 years of age and continues through 25 years of age.  Your health care provider may recommend screening at an earlier age if you have risk factors for colon cancer.  Your health care provider may also recommend using home test kits to check for hidden blood in the stool.  A small camera at the end of a tube can be used to examine your colon directly (sigmoidoscopy or colonoscopy). This is done to check for the earliest forms of colorectal cancer.  Routine  screening usually begins at age 41.  Direct examination of the colon should be repeated every 5-10 years through 25 years of age. However, you may need to be screened more often if early forms of precancerous polyps or small growths are found. Skin Cancer  Check your skin from head to toe regularly.  Tell your health care provider about any new moles or changes in moles, especially if there is a change in a mole's shape or color.  Also tell your health care provider if you have a mole that is larger than the size of a pencil eraser.  Always use sunscreen. Apply sunscreen liberally and repeatedly throughout the day.  Protect yourself by wearing long sleeves, pants, a wide-brimmed hat, and sunglasses whenever you are outside. Heart disease, diabetes, and high blood pressure  High blood pressure causes heart disease and increases the risk of stroke. High blood pressure is more likely to develop in:  People who have blood pressure in the high end of the normal range (130-139/85-89 mm Hg).  People who are overweight or obese.  People who are African American.  If you are 59-24 years of age, have your blood pressure checked every 3-5 years. If you are 34 years of age or older, have your blood pressure checked every year. You should have your blood pressure measured twice-once when you are at a hospital or clinic, and once when you are not at a hospital or clinic. Record the average of the two measurements. To check your blood pressure when you are not at a hospital or clinic, you can use:  An automated blood pressure machine at a pharmacy.  A home blood pressure monitor.  If you are between 29 years and 60 years old, ask your health care provider if you should take aspirin to prevent strokes.  Have regular diabetes screenings. This involves taking a blood sample to check your fasting blood sugar level.  If you are at a normal weight and have a low risk for diabetes, have this test once  every three years after 25 years of age.  If you are overweight and have a high risk for diabetes, consider being tested at a younger age or more often. Preventing infection Hepatitis B  If you have a higher risk for hepatitis B, you should be screened for this virus. You are considered at high risk for hepatitis B if:  You were born in a country where hepatitis B is common. Ask your health care provider which countries are considered high risk.  Your parents were born in a high-risk country, and you have not been immunized against hepatitis B (hepatitis B vaccine).  You have HIV or AIDS.  You use needles to inject street drugs.  You live with someone who has hepatitis B.  You have had sex with someone who has hepatitis B.  You get hemodialysis treatment.  You take certain medicines for conditions, including cancer, organ transplantation, and autoimmune conditions. Hepatitis C  Blood testing is recommended for:  Everyone born from [birth year] through 1964.  Anyone with known risk factors for hepatitis C. Sexually transmitted infections (STIs)  You should be screened for sexually transmitted infections (STIs) including gonorrhea and chlamydia if:  You are sexually active and are younger than 25 years of age.  You are older than 25 years of age and your health care provider tells you that you are at risk for this type of infection.  Your sexual activity has changed since you were last screened and you are at an increased risk for chlamydia or gonorrhea. Ask your health care provider if you are at risk.  If you do not have HIV, but are at risk, it may be recommended that you take a prescription medicine daily to prevent HIV infection. This is called pre-exposure prophylaxis (PrEP). You are considered at risk if:  You are sexually active and do not regularly use condoms or know the HIV status of your partner(s).  You take drugs by injection.  You are sexually active with a partner  who has HIV. Talk with your health care provider about whether you are at high risk of being infected with HIV. If you choose to begin PrEP, you should first be tested for HIV. You should then be tested every 3 months for as long as you are taking PrEP. Pregnancy  If you are premenopausal and you may become pregnant, ask your health care provider about preconception counseling.  If you may become pregnant, take 400 to 800 micrograms (mcg) of folic acid  every day.  If you want to prevent pregnancy, talk to your health care provider about birth control (contraception). Osteoporosis and menopause  Osteoporosis is a disease in which the bones lose minerals and strength with aging. This can result in serious bone fractures. Your risk for osteoporosis can be identified using a bone density scan.  If you are 4 years of age or older, or if you are at risk for osteoporosis and fractures, ask your health care provider if you should be screened.  Ask your health care provider whether you should take a calcium or vitamin D supplement to lower your risk for osteoporosis.  Menopause may have certain physical symptoms and risks.  Hormone replacement therapy may reduce some of these symptoms and risks. Talk to your health care provider about whether hormone replacement therapy is right for you. Follow these instructions at home:  Schedule regular health, dental, and eye exams.  Stay current with your immunizations.  Do not use any tobacco products including cigarettes, chewing tobacco, or electronic cigarettes.  If you are pregnant, do not drink alcohol.  If you are breastfeeding, limit how much and how often you drink alcohol.  Limit alcohol intake to no more than 1 drink per day for nonpregnant women. One drink equals 12 ounces of beer, 5 ounces of wine, or 1 ounces of hard liquor.  Do not use street drugs.  Do not share needles.  Ask your health care provider for help if you need support  or information about quitting drugs.  Tell your health care provider if you often feel depressed.  Tell your health care provider if you have ever been abused or do not feel safe at home. This information is not intended to replace advice given to you by your health care provider. Make sure you discuss any questions you have with your health care provider. Document Released: 07/09/2010 Document Revised: 06/01/2015 Document Reviewed: 09/27/2014 Elsevier Interactive Patient Education  2017 [employer].

## 2016-04-19 LAB — CBC
HEMOGLOBIN: 13.5 g/dL (ref 11.1–15.9)
Hematocrit: 40.7 % (ref 34.0–46.6)
MCH: 28.5 pg (ref 26.6–33.0)
MCHC: 33.2 g/dL (ref 31.5–35.7)
MCV: 86 fL (ref 79–97)
PLATELETS: 263 10*3/uL (ref 150–379)
RBC: 4.73 x10E6/uL (ref 3.77–5.28)
RDW: 15.8 % — ABNORMAL HIGH (ref 12.3–15.4)
WBC: 8.9 10*3/uL (ref 3.4–10.8)

## 2016-04-19 LAB — PLEASE NOTE

## 2016-04-29 ENCOUNTER — Other Ambulatory Visit: Payer: Self-pay | Admitting: Obstetrics and Gynecology

## 2016-04-30 ENCOUNTER — Other Ambulatory Visit: Payer: Self-pay

## 2016-06-08 ENCOUNTER — Emergency Department: Payer: 59

## 2016-06-08 ENCOUNTER — Emergency Department
Admission: EM | Admit: 2016-06-08 | Discharge: 2016-06-08 | Disposition: A | Discharge status: Home / Self Care | Payer: 59 | Attending: Emergency Medicine | Admitting: Emergency Medicine

## 2016-06-08 ENCOUNTER — Encounter: Payer: Self-pay | Admitting: Medical Oncology

## 2016-06-08 DIAGNOSIS — Z87891 Personal history of nicotine dependence: Secondary | ICD-10-CM | POA: Diagnosis not present

## 2016-06-08 DIAGNOSIS — R1032 Left lower quadrant pain: Secondary | ICD-10-CM | POA: Insufficient documentation

## 2016-06-08 DIAGNOSIS — R197 Diarrhea, unspecified: Secondary | ICD-10-CM | POA: Diagnosis not present

## 2016-06-08 DIAGNOSIS — R1011 Right upper quadrant pain: Secondary | ICD-10-CM | POA: Diagnosis not present

## 2016-06-08 DIAGNOSIS — J45909 Unspecified asthma, uncomplicated: Secondary | ICD-10-CM | POA: Diagnosis not present

## 2016-06-08 DIAGNOSIS — R112 Nausea with vomiting, unspecified: Secondary | ICD-10-CM | POA: Insufficient documentation

## 2016-06-08 DIAGNOSIS — R52 Pain, unspecified: Secondary | ICD-10-CM

## 2016-06-08 LAB — BASIC METABOLIC PANEL
Anion gap: 9 (ref 5–15)
BUN: 9 mg/dL (ref 6–20)
CALCIUM: 8.8 mg/dL — AB (ref 8.9–10.3)
CO2: 21 mmol/L — AB (ref 22–32)
CREATININE: 0.79 mg/dL (ref 0.44–1.00)
Chloride: 104 mmol/L (ref 101–111)
GFR calc non Af Amer: >60 mL/min (ref >60)
Glucose, Bld: 86 mg/dL (ref 65–99)
Potassium: 3.4 mmol/L — ABNORMAL LOW (ref 3.5–5.1)
SODIUM: 134 mmol/L — AB (ref 135–145)

## 2016-06-08 LAB — CBC WITH DIFFERENTIAL/PLATELET
BASOS ABS: 0 10*3/uL (ref 0–0.1)
BASOS PCT: 0 %
EOS PCT: 0 %
Eosinophils Absolute: 0 10*3/uL (ref 0–0.7)
HCT: 44 % (ref 35.0–47.0)
Hemoglobin: 14.9 g/dL (ref 12.0–16.0)
LYMPHS PCT: 5 %
Lymphs Abs: 0.3 10*3/uL — ABNORMAL LOW (ref 1.0–3.6)
MCH: 30.2 pg (ref 26.0–34.0)
MCHC: 34 g/dL (ref 32.0–36.0)
MCV: 88.9 fL (ref 80.0–100.0)
Monocytes Absolute: 0.4 10*3/uL (ref 0.2–0.9)
Monocytes Relative: 8 %
Neutro Abs: 4.3 10*3/uL (ref 1.4–6.5)
Neutrophils Relative %: 87 %
PLATELETS: 158 10*3/uL (ref 150–440)
RBC: 4.95 MIL/uL (ref 3.80–5.20)
RDW: 14.2 % (ref 11.5–14.5)
WBC: 5 10*3/uL (ref 3.6–11.0)

## 2016-06-08 LAB — URINALYSIS, COMPLETE (UACMP) WITH MICROSCOPIC
BILIRUBIN URINE: NEGATIVE
Glucose, UA: NEGATIVE mg/dL
KETONES UR: 20 mg/dL — AB
Nitrite: NEGATIVE
Protein, ur: NEGATIVE mg/dL
Specific Gravity, Urine: 1.015 (ref 1.005–1.030)
pH: 5 (ref 5.0–8.0)

## 2016-06-08 LAB — HEPATIC FUNCTION PANEL
ALBUMIN: 4.2 g/dL (ref 3.5–5.0)
ALT: 18 U/L (ref 14–54)
AST: 20 U/L (ref 15–41)
Alkaline Phosphatase: 60 U/L (ref 38–126)
BILIRUBIN TOTAL: 0.5 mg/dL (ref 0.3–1.2)
Bilirubin, Direct: <0.1 mg/dL — ABNORMAL LOW (ref 0.1–0.5)
TOTAL PROTEIN: 7.5 g/dL (ref 6.5–8.1)

## 2016-06-08 LAB — LIPASE, BLOOD: LIPASE: 19 U/L (ref 11–51)

## 2016-06-08 LAB — HCG, QUANTITATIVE, PREGNANCY: hCG, Beta Chain, Quant, S: <1 m[IU]/mL (ref <5)

## 2016-06-08 MED ORDER — ONDANSETRON HCL 4 MG/2ML IJ SOLN
4.0000 mg | Freq: Once | INTRAMUSCULAR | Status: AC
  Administered 2016-06-08: 4 mg via INTRAVENOUS
  Filled 2016-06-08: qty 2

## 2016-06-08 MED ORDER — IOPAMIDOL (ISOVUE-300) INJECTION 61%
15.0000 mL | INTRAVENOUS | Status: AC
  Administered 2016-06-08: 15 mL via ORAL

## 2016-06-08 MED ORDER — MORPHINE SULFATE (PF) 4 MG/ML IV SOLN
4.0000 mg | Freq: Once | INTRAVENOUS | Status: AC
  Administered 2016-06-08: 4 mg via INTRAVENOUS
  Filled 2016-06-08: qty 1

## 2016-06-08 MED ORDER — IOPAMIDOL (ISOVUE-300) INJECTION 61%
100.0000 mL | Freq: Once | INTRAVENOUS | Status: AC | PRN
  Administered 2016-06-08: 100 mL via INTRAVENOUS

## 2016-06-08 MED ORDER — LOPERAMIDE HCL 2 MG PO CAPS
2.0000 mg | ORAL_CAPSULE | Freq: Once | ORAL | Status: AC
  Administered 2016-06-08: 2 mg via ORAL
  Filled 2016-06-08: qty 1

## 2016-06-08 MED ORDER — SODIUM CHLORIDE 0.9 % IV BOLUS (SEPSIS)
1000.0000 mL | Freq: Once | INTRAVENOUS | Status: AC
  Administered 2016-06-08: 1000 mL via INTRAVENOUS

## 2016-06-08 MED ORDER — LOPERAMIDE HCL 2 MG PO TABS: 2.0000 mg | ORAL_TABLET | Freq: Four times a day (QID) | ORAL | 0 refills | Status: DC | PRN

## 2016-06-08 NOTE — ED Triage Notes (Signed)
Pt from [facility] with hx of crohn's but reports Thursday she began having worsening RUQ abd pain with NVD.

## 2016-06-08 NOTE — ED Notes (Signed)
Pt in u/s

## 2016-06-08 NOTE — Discharge Instructions (Signed)
Please make an appointment to follow-up with a gastroenterologist within the next week or so for reevaluation. Please take your loperamide as prescribed and return to the emergency department for any concerns such as if you cannot eat or drink, if you diarrhea lasts more than 2 or 3 days, or for any other concerns.  It was a pleasure to take care of you today, and thank you for coming to our emergency department.  If you have any questions or concerns before leaving please ask the nurse to grab me and I'm more than happy to go through your aftercare instructions again.  If you were prescribed any opioid pain medication today such as Norco, Vicodin, Percocet, morphine, hydrocodone, or oxycodone please make sure you do not drive when you are taking this medication as it can alter your ability to drive safely.  If you have any concerns once you are home that you are not improving or are in fact getting worse before you can make it to your follow-up appointment, please do not hesitate to call 911 and come back for further evaluation.  Merrily Brittle MD  Results for orders placed or performed during the hospital encounter of 06/08/16  Urinalysis, Complete w Microscopic  Result Value Ref Range   Color, Urine YELLOW (A) YELLOW   APPearance HAZY (A) CLEAR   Specific Gravity, Urine 1.015 1.005 - 1.030   pH 5.0 5.0 - 8.0   Glucose, UA NEGATIVE NEGATIVE mg/dL   Hgb urine dipstick SMALL (A) NEGATIVE   Bilirubin Urine NEGATIVE NEGATIVE   Ketones, ur 20 (A) NEGATIVE mg/dL   Protein, ur NEGATIVE NEGATIVE mg/dL   Nitrite NEGATIVE NEGATIVE   Leukocytes, UA SMALL (A) NEGATIVE   RBC / HPF 0-5 0 - 5 RBC/hpf   WBC, UA 6-30 0 - 5 WBC/hpf   Bacteria, UA RARE (A) NONE SEEN   Squamous Epithelial / LPF 6-30 (A) NONE SEEN   Mucous PRESENT    Hyaline Casts, UA PRESENT   Basic metabolic panel  Result Value Ref Range   Sodium 134 (L) 135 - 145 mmol/L   Potassium 3.4 (L) 3.5 - 5.1 mmol/L   Chloride 104 101 - 111  mmol/L   CO2 21 (L) 22 - 32 mmol/L   Glucose, Bld 86 65 - 99 mg/dL   BUN 9 6 - 20 mg/dL   Creatinine, Ser 4.09 0.44 - 1.00 mg/dL   Calcium 8.8 (L) 8.9 - 10.3 mg/dL   GFR calc non Af Amer >60 >60 mL/min   GFR calc Af Amer >60 >60 mL/min   Anion gap 9 5 - 15  Hepatic function panel  Result Value Ref Range   Total Protein 7.5 6.5 - 8.1 g/dL   Albumin 4.2 3.5 - 5.0 g/dL   AST 20 15 - 41 U/L   ALT 18 14 - 54 U/L   Alkaline Phosphatase 60 38 - 126 U/L   Total Bilirubin 0.5 0.3 - 1.2 mg/dL   Bilirubin, Direct <8.1 (L) 0.1 - 0.5 mg/dL   Indirect Bilirubin NOT CALCULATED 0.3 - 0.9 mg/dL  Lipase, blood  Result Value Ref Range   Lipase 19 11 - 51 U/L  CBC with Differential  Result Value Ref Range   WBC 5.0 3.6 - 11.0 K/uL   RBC 4.95 3.80 - 5.20 MIL/uL   Hemoglobin 14.9 12.0 - 16.0 g/dL   HCT 19.1 47.8 - 29.5 %   MCV 88.9 80.0 - 100.0 fL   MCH 30.2 26.0 - 34.0  pg   MCHC 34.0 32.0 - 36.0 g/dL   RDW 16.114.2 09.611.5 - 04.514.5 %   Platelets 158 150 - 440 K/uL   Neutrophils Relative % 87 %   Neutro Abs 4.3 1.4 - 6.5 K/uL   Lymphocytes Relative 5 %   Lymphs Abs 0.3 (L) 1.0 - 3.6 K/uL   Monocytes Relative 8 %   Monocytes Absolute 0.4 0.2 - 0.9 K/uL   Eosinophils Relative 0 %   Eosinophils Absolute 0.0 0 - 0.7 K/uL   Basophils Relative 0 %   Basophils Absolute 0.0 0 - 0.1 K/uL  hCG, quantitative, pregnancy  Result Value Ref Range   hCG, Beta Chain, Quant, S <1 <5 mIU/mL   Ct Abdomen Pelvis W Contrast  Result Date: 06/08/2016 CLINICAL DATA:  History of untreated Crohn's disease, right upper quadrant abdominal pain with nausea, vomiting and diarrhea. EXAM: CT ABDOMEN AND PELVIS WITH CONTRAST TECHNIQUE: Multidetector CT imaging of the abdomen and pelvis was performed using the standard protocol following bolus administration of intravenous contrast. CONTRAST:  100 cc Isovue-300 COMPARISON:  06/08/2016 ultrasound FINDINGS: Lower chest: Minor dependent basilar atelectasis. Normal heart size. No  pericardial or pleural effusion. No acute finding. Hepatobiliary: No focal liver abnormality is seen. No gallstones, gallbladder wall thickening, or biliary dilatation. Pancreas: Unremarkable. No pancreatic ductal dilatation or surrounding inflammatory changes. Spleen: Normal in size without focal abnormality. Adrenals/Urinary Tract: Normal adrenal glands. Right kidney demonstrates an extrarenal pelvis versus a mild UPJ configuration without acute obstructive uropathy. No renal obstruction or hydronephrosis. No obstructing ureteral calculus. Urinary bladder unremarkable. No focal renal abnormality. Stomach/Bowel: Negative for bowel obstruction, significant dilatation, ileus, or free air. No fluid collection or abscess. Distal ileum and the entire colon are fluid filled with scattered air-fluid levels compatible with ongoing diarrhea but no significant bowel wall thickening or localized inflammatory process. Remote appendectomy. Vascular/Lymphatic: No significant vascular findings are present. No enlarged abdominal or pelvic lymph nodes. Reproductive: Uterus and adnexa within normal limits. Suspect 3.2 cm right ovarian cyst, image 83 series 2. No significant pelvic free fluid or hemorrhage. Other: No abdominal wall hernia or abnormality. No abdominopelvic ascites. Musculoskeletal: No acute or significant osseous findings. IMPRESSION: Nonspecific mild fluid distention of the distal ileum and entire colon with scattered associated air-fluid levels compatible with ongoing diarrhea but no significant area of bowel wall thickening or acute inflammatory process within the abdomen or pelvis. Negative for obstruction, free air, or abscess Right kidney extrarenal pelvis versus mild UPJ configuration without acute obstructive uropathy Probable 3.2 cm right ovarian cyst Electronically Signed   By: Judie PetitM.  Shick M.D.   On: 06/08/2016 14:50   [place] Abdomen Limited Ruq  Result Date: 06/08/2016 CLINICAL DATA:  Right upper quadrant pain  over the last 3 days. EXAM: US ABDOMEN LIMITED - RIGHT UPPER QUADRANT COMPARISON:  None. FINDINGS: Gallbladder: No gallstones or wall thickening visualized. No sonographic Murphy sign noted by sonographer. Common bile duct: Diameter: 3 mm, normal Liver: No focal lesion identified. Within normal limits in parenchymal echogenicity. IMPRESSION: Normal right upper quadrant ultrasound. No abnormality seen to explain pain. Electronically Signed   By: Paulina FusiMark  Shogry M.D.   On: 06/08/2016 12:36

## 2016-06-08 NOTE — ED Notes (Signed)
Pt was flushing toilet as nurse walked in - states was unable to give urine sample.

## 2016-06-08 NOTE — ED Notes (Signed)
Ct added on - us negative. After speaking with pt, she states her crohn's was dx by gi with a ct. Found the record from 2014, but unable to find dx of crohns or what medicine she was placed. Pt states never filled them d/t being unable to afford them.

## 2016-06-08 NOTE — ED Notes (Signed)
Gave patient blanket.

## 2016-06-08 NOTE — ED Provider Notes (Signed)
[facility] Emergency Department Provider Note  ____________________________________________   First MD Initiated Contact with Patient 06/08/16 1151     (approximate)  I have reviewed the triage vital signs and the nursing notes.   HISTORY  Chief Complaint Abdominal Pain; Emesis; and Diarrhea   HPI Rose [name] is a 26 y.o. female who self presents to emergency Department with roughly 24 hours of worsening abdominal pain. Her pain is right upper quadrant constant worse with movement and improved with rest. She has not tried to eat in the past several days. She's had about a week or 2 of mild diffuse abdominal discomfort but this is new and different. She's had several episodes of loose watery stools. She has a past medical history of Crohn's disease although she has been noncompliant with her medications for the past 3 years if she cannot afford them. She has a past surgical history of appendectomy.   Past Medical History:  Diagnosis Date  . Asthma   . History of postpartum hemorrhage   . Hx of chlamydia infection    +12/22/11, TOC neg   . IBD (inflammatory bowel disease)    Chron's disease  . 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   . Personal history UTI     Patient Active Problem List   Diagnosis Date Noted  . Tobacco abuse 05/20/2015  . Obesity (BMI 30.0-34.9) 05/20/2015  . Migraine with aura and without status migrainosus, not intractable 05/20/2015  . Left arm numbness     Past Surgical History:  Procedure Laterality Date  . APPENDECTOMY  2013   acute appendicitis  . EYE SURGERY    . LAPAROSCOPIC APPENDECTOMY  02/03/2012   Procedure: APPENDECTOMY LAPAROSCOPIC;  Surgeon: Wilmon Arms. Corliss Skains, MD;  Location: [facility] OR;  Service: General;  Laterality: N/A;  . lymph node biopsy     Left neck after cat scratch fever  . TYMPANOSTOMY TUBE PLACEMENT      Prior to Admission medications   Not  on File    Allergies Penicillins  Family History  Problem Relation Age of Onset  . COPD Mother   . Asthma Mother   . Lung cancer Maternal Grandmother     Social History Social History  Substance Use Topics  . Smoking status: Former Smoker    Types: Cigarettes    Quit date: 06/08/2015  . Smokeless tobacco: Never Used     Comment: One pack per week.  . Alcohol use No     Comment: Occasion use - socially    Review of Systems Constitutional: No fever/chills Eyes: No visual changes. ENT: No sore throat. Cardiovascular: Denies chest pain. Respiratory: Denies shortness of breath. Gastrointestinal: Positive abdominal pain.  Positive nausea, no vomiting.  Positive diarrhea.  No constipation. Genitourinary: Negative for dysuria. Musculoskeletal: Negative for back pain. Skin: Negative for rash. Neurological: Negative for headaches, focal weakness or numbness.   ____________________________________________   PHYSICAL EXAM:  VITAL SIGNS: ED Triage Vitals  Enc Vitals Group     BP 06/08/16 1149 121/70     Pulse Rate 06/08/16 1149 70     Resp 06/08/16 1149 17     Temp 06/08/16 1149 98.5 F (36.9 C)     Temp Source 06/08/16 1149 Oral     SpO2 06/08/16 1149 100 %     Weight 06/08/16 1144 226 lb (102.5 kg)     Height --      Head  Circumference --      Peak Flow --      Pain Score 06/08/16 1144 8     Pain Loc --      Pain Edu? --      Excl. in GC? --     Constitutional: Alert and oriented x 4 well appearing nontoxic no diaphoresis speaks in full, clear sentences Eyes: PERRL EOMI. Head: Atraumatic. Nose: No congestion/rhinnorhea. Mouth/Throat: No trismus Neck: No stridor.   Cardiovascular: Normal rate, regular rhythm. Grossly normal heart sounds.  Good peripheral circulation. Respiratory: Normal respiratory effort.  No retractions. Lungs CTAB and moving good air Gastrointestinal: Soft nondistended she is tender in her right upper quadrant with equivocal Murphy's no  right lower quadrant tenderness she does have some tenderness in the left lower quadrant but no rebound or guarding and no frank peritonitis Musculoskeletal: No lower extremity edema   Neurologic:  Normal speech and language. No gross focal neurologic deficits are appreciated. Skin:  Skin is warm, dry and intact. No rash noted. Psychiatric: Mood and affect are normal. Speech and behavior are normal.    ____________________________________________   DIFFERENTIAL  Biliary colic, cholecystitis, cholangitis, diverticulitis, abscess, fistula, Crohn's flare ____________________________________________   LABS (all labs ordered are listed, but only abnormal results are displayed)  Labs Reviewed  URINALYSIS, COMPLETE (UACMP) WITH MICROSCOPIC - Abnormal; Notable for the following:       Result Value   Color, Urine YELLOW (*)    APPearance HAZY (*)    Hgb urine dipstick SMALL (*)    Ketones, ur 20 (*)    Leukocytes, UA SMALL (*)    Bacteria, UA RARE (*)    Squamous Epithelial / LPF 6-30 (*)    All other components within normal limits  BASIC METABOLIC PANEL - Abnormal; Notable for the following:    Sodium 134 (*)    Potassium 3.4 (*)    CO2 21 (*)    Calcium 8.8 (*)    All other components within normal limits  HEPATIC FUNCTION PANEL - Abnormal; Notable for the following:    Bilirubin, Direct <0.1 (*)    All other components within normal limits  CBC WITH DIFFERENTIAL/PLATELET - Abnormal; Notable for the following:    Lymphs Abs 0.3 (*)    All other components within normal limits  LIPASE, BLOOD  HCG, QUANTITATIVE, PREGNANCY    Electrolytes unremarkable __________________________________________  EKG   ____________________________________________  RADIOLOGY  Normal right upper quadrant ultrasound CT scan shows diffuse evidence of diarrhea but no abscess ____________________________________________   PROCEDURES  Procedure(s) performed: no  Procedures  Critical  Care performed: no  Observation: no ____________________________________________   INITIAL IMPRESSION / ASSESSMENT AND PLAN / ED COURSE  Pertinent labs & imaging results that were available during my care of the patient were reviewed by me and considered in my medical decision making (see chart for details).  The patient is well-appearing although with significant tenderness in her right upper quadrant. Differential is broad but includes cholecystitis versus an abdominal abscess secondary to her Crohn's disease. We'll begin with an ultrasound to hopefully spare her radiation.     ----------------------------------------- 3:01 PM on 06/08/2016 -----------------------------------------  The patient's CT scan is quite nonspecific and is suggestive of diarrhea. This could be secondary to an infectious process versus a Crohn's flare. Unfortunately we do not have GI on-call right now so I will reach out to [facility] for consultation regarding possible steroids for a flare. ____________________________________________   ----------------------------------------- 3:50 PM on 06/08/2016 -----------------------------------------  I [facility] the case [facility] who indicated that as the patient does not have a tissue diagnosis of Crohn's disease and has never had a colonoscopy we are not actually entirely sure that she has the disease to begin with. Her CT scan only shows diarrhea which could be from a variety of issues and is most likely viral gastroenteritis. At this point rather than steroids I will begin her on loperamide and refer her to GI as an outpatient. The patient understands and agrees the plan.  FINAL CLINICAL IMPRESSION(S) / ED DIAGNOSES  Final diagnoses:  Pain  Diarrhea, unspecified type      NEW MEDICATIONS STARTED DURING THIS VISIT:  New Prescriptions   No medications on file     Note:  This document was prepared using Dragon voice recognition  software and may include unintentional dictation errors.     Merrily Brittleifenbark, Sy Saintjean, MD 06/08/16 [phone]

## 2016-06-08 NOTE — ED Notes (Signed)
Pt slowly drinking her first contrast. Is to start the 2nd at 2pm and knows to call me when she completes them.

## 2017-02-03 ENCOUNTER — Emergency Department
Admission: EM | Admit: 2017-02-03 | Discharge: 2017-02-03 | Disposition: A | Discharge status: Home / Self Care | Payer: Managed Care, Other (non HMO) | Attending: Emergency Medicine | Admitting: Emergency Medicine

## 2017-02-03 ENCOUNTER — Encounter: Payer: Self-pay | Admitting: Medical Oncology

## 2017-02-03 DIAGNOSIS — J45909 Unspecified asthma, uncomplicated: Secondary | ICD-10-CM | POA: Insufficient documentation

## 2017-02-03 DIAGNOSIS — J029 Acute pharyngitis, unspecified: Secondary | ICD-10-CM | POA: Insufficient documentation

## 2017-02-03 DIAGNOSIS — Z87891 Personal history of nicotine dependence: Secondary | ICD-10-CM | POA: Insufficient documentation

## 2017-02-03 DIAGNOSIS — Z79899 Other long term (current) drug therapy: Secondary | ICD-10-CM | POA: Insufficient documentation

## 2017-02-03 DIAGNOSIS — R590 Localized enlarged lymph nodes: Secondary | ICD-10-CM | POA: Insufficient documentation

## 2017-02-03 DIAGNOSIS — Z880 Allergy status to penicillin: Secondary | ICD-10-CM | POA: Insufficient documentation

## 2017-02-03 LAB — GROUP A STREP BY PCR: Group A Strep by PCR: NOT DETECTED

## 2017-02-03 MED ORDER — DIPHENHYDRAMINE HCL 12.5 MG/5ML PO ELIX
25.0000 mg | ORAL_SOLUTION | Freq: Once | ORAL | Status: AC
  Administered 2017-02-03: 25 mg via ORAL
  Filled 2017-02-03: qty 10

## 2017-02-03 MED ORDER — LIDOCAINE VISCOUS 2 % MT SOLN
15.0000 mL | Freq: Once | OROMUCOSAL | Status: AC
  Administered 2017-02-03: 15 mL via OROMUCOSAL
  Filled 2017-02-03: qty 15

## 2017-02-03 MED ORDER — METHYLPREDNISOLONE 4 MG PO TBPK: ORAL_TABLET | ORAL | 0 refills | Status: DC

## 2017-02-03 MED ORDER — MAGIC MOUTHWASH W/LIDOCAINE
5.0000 mL | Freq: Four times a day (QID) | ORAL | 0 refills | Status: DC
Start: 2017-02-03 — End: 2018-11-18

## 2017-02-03 NOTE — ED Provider Notes (Signed)
[facility] Emergency Department Provider Note   ____________________________________________   First MD Initiated Contact with Patient 02/03/17 1420     (approximate)  I have reviewed the triage vital signs and the nursing notes.   HISTORY  Chief Complaint Sore Throat    HPI Rose [name] is a 27 y.o. female patient presents with sore throat and pain with swallowing for 4 days.  Patient state can tolerate fluids.  Patient denies URI signs and symptoms.  Patient denies nausea, vomiting, or diarrhea.  Patient is tearful in exam room.  Patient rates the pain as 8/10.  Patient described the pain is "sore" ".  No palliative measure for complaint.  Past Medical History:  Diagnosis Date  . Asthma   . History of postpartum hemorrhage   . Hx of chlamydia infection    +12/22/11, TOC neg   . IBD (inflammatory bowel disease)    Chron's disease  . 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   . Personal history UTI     Patient Active Problem List   Diagnosis Date Noted  . Tobacco abuse 05/20/2015  . Obesity (BMI 30.0-34.9) 05/20/2015  . Migraine with aura and without status migrainosus, not intractable 05/20/2015  . Left arm numbness     Past Surgical History:  Procedure Laterality Date  . APPENDECTOMY  2013   acute appendicitis  . EYE SURGERY    . LAPAROSCOPIC APPENDECTOMY  02/03/2012   Procedure: APPENDECTOMY LAPAROSCOPIC;  Surgeon: Wilmon ArmsMatthew K. Corliss Skainssuei, MD;  Location: [facility] OR;  Service: General;  Laterality: N/A;  . lymph node biopsy     Left neck after cat scratch fever  . TYMPANOSTOMY TUBE PLACEMENT      Prior to Admission medications   Medication Sig Start Date End Date Taking? Authorizing Provider  acetaminophen (TYLENOL) 500 MG tablet Take 500 mg by mouth every 6 (six) hours as needed.    [provider]  ibuprofen (ADVIL,MOTRIN) 200 MG tablet Take 200 mg by mouth every 6 (six) hours as  needed.    [provider]  loperamide (LOPERAMIDE A-D) 2 MG tablet Take 1 tablet (2 mg total) by mouth 4 (four) times daily as needed for diarrhea or loose stools. 06/08/16   Merrily Brittleifenbark, Neil, MD  magic mouthwash w/lidocaine SOLN Take 5 mLs by mouth 4 (four) times daily. Swish and swallow. 02/03/17   Joni ReiningSmith, Ronald K, PA-C  methylPREDNISolone (MEDROL DOSEPAK) 4 MG TBPK tablet Take Tapered dose as directed 02/03/17   Joni ReiningSmith, Ronald K, PA-C  clidinium-chlordiazePOXIDE (LIBRAX) 5-2.5 MG per capsule Take 1 capsule by mouth 3 (three) times daily before meals. Patient not taking: Reported on 02/16/2014 04/20/13 04/26/14  Esterwood, Amy S, PA-C  omeprazole (PRILOSEC) 20 MG capsule Take one capsule PO twice a day for 3 days, then one capsule PO once a day. Patient not taking: Reported on 04/26/2014 04/15/13 04/26/14  Renne CriglerGeiple, Joshua, PA-C    Allergies Penicillins  Family History  Problem Relation Age of Onset  . COPD Mother   . Asthma Mother   . Lung cancer Maternal Grandmother     Social History Social History   Tobacco Use  . Smoking status: Former Smoker    Types: Cigarettes    Last attempt to quit: 06/08/2015    Years since quitting: 1.6  . Smokeless tobacco: Never Used  . Tobacco comment: One pack per week.  Substance Use Topics  . Alcohol use: No  Alcohol/week: 0.0 oz    Comment: Occasion use - socially  . Drug use: Yes    Types: Marijuana    Review of Systems Constitutional: No fever/chills Eyes: No visual changes. ENT: Sore throat Cardiovascular: Denies chest pain. Respiratory: Denies shortness of breath. Gastrointestinal: No abdominal pain.  No nausea, no vomiting.  No diarrhea.  No constipation. Genitourinary: Negative for dysuria. Musculoskeletal: Negative for back pain. Skin: Negative for rash. Neurological: Negative for headaches, focal weakness or numbness.   ____________________________________________   PHYSICAL EXAM:  VITAL SIGNS: ED Triage Vitals  [02/03/17 1334]  Enc Vitals Group     BP (!) 144/69     Pulse Rate 72     Resp 16     Temp 98.5 F (36.9 C)     Temp Source Oral     SpO2 98 %     Weight 209 lb (94.8 kg)     Height 5\' 8"  (1.727 m)     Head Circumference      Peak Flow      Pain Score 8     Pain Loc      Pain Edu?      Excl. in GC?     Constitutional: Alert and oriented. Well appearing and in no acute distress. Eyes: Conjunctivae are normal. PERRL. EOMI. Mouth/Throat: Mucous membranes are moist.  Oropharynx erythematous. Neck: No stridor. Hematological/Lymphatic/Immunilogical: Bilateral cervical lymphadenopathy. Cardiovascular: Normal rate, regular rhythm. Grossly normal heart sounds.  Good peripheral circulation. Respiratory: Normal respiratory effort.  No retractions. Lungs CTAB. Neurologic:  Normal speech and language. No gross focal neurologic deficits are appreciated. No gait instability. Skin:  Skin is warm, dry and intact. No rash noted. Psychiatric: Mood and affect are normal. Speech and behavior are normal.  ____________________________________________   LABS (all labs ordered are listed, but only abnormal results are displayed)  Labs Reviewed  GROUP A STREP BY PCR   ____________________________________________  EKG   ____________________________________________  RADIOLOGY    ____________________________________________   PROCEDURES  Procedure(s) performed: None  Procedures  Critical Care performed: No  ____________________________________________   INITIAL IMPRESSION / ASSESSMENT AND PLAN / ED COURSE  As part of my medical decision making, I reviewed the following data within the electronic MEDICAL RECORD NUMBER    Sore throat secondary to viral pharyngitis.  Discussed negative strep results with patient.  Patient given discharge care instruction advised take medication as directed.  [facility].      ____________________________________________   FINAL CLINICAL IMPRESSION(S) / ED DIAGNOSES  Final diagnoses:  Viral pharyngitis     ED Discharge Orders        Ordered    magic mouthwash w/lidocaine SOLN  4 times daily    Comments:  Mix 30 mL of Benadryl, 30 mL of nystatin, 30 mL of viscous lidocaine, and 30 mL of aluminum and magnesium hydroxide-Simeth   02/03/17 1526    methylPREDNISolone (MEDROL DOSEPAK) 4 MG TBPK tablet     02/03/17 1526       Note:  This document was prepared using Dragon voice recognition software and may include unintentional dictation errors.    Joni Reining, PA-C 02/03/17 1529    Loleta Rose, MD 02/03/17 1721

## 2017-02-03 NOTE — ED Triage Notes (Signed)
Pt reports sore throat with painful swallowing that began Thursday.

## 2017-02-03 NOTE — ED Triage Notes (Signed)
FIRST NURSE NOTE-sore throat. Pain with swallowing. Handling secretions.

## 2017-06-05 ENCOUNTER — Ambulatory Visit (INDEPENDENT_AMBULATORY_CARE_PROVIDER_SITE_OTHER): Payer: Medicaid Other | Admitting: Obstetrics and Gynecology

## 2017-06-05 ENCOUNTER — Encounter: Payer: Self-pay | Admitting: Obstetrics and Gynecology

## 2017-06-05 VITALS — BP 103/69 | HR 76 | Ht 68.0 in | Wt 217.6 lb

## 2017-06-05 DIAGNOSIS — E669 Obesity, unspecified: Secondary | ICD-10-CM

## 2017-06-05 DIAGNOSIS — Z113 Encounter for screening for infections with a predominantly sexual mode of transmission: Secondary | ICD-10-CM | POA: Diagnosis not present

## 2017-06-05 DIAGNOSIS — Z01419 Encounter for gynecological examination (general) (routine) without abnormal findings: Secondary | ICD-10-CM

## 2017-06-05 DIAGNOSIS — Z975 Presence of (intrauterine) contraceptive device: Secondary | ICD-10-CM

## 2017-06-05 NOTE — Progress Notes (Signed)
GYNECOLOGY ANNUAL PHYSICAL EXAM PROGRESS NOTE  Subjective:    Rose [name] is a 27 y.o. G101P2002 female who presents for an annual exam.  The patient is sexually active.  The patient wears seatbelts: yes. The patient participates in regular exercise: no. Has the patient ever been transfused or tattooed?: no. The patient reports that there is not domestic violence in her life.   The patient has the following complaints today: 1. None.  However she does request STD screening as she thinks her husband may have been unfaithful.    Gynecologic History  Menarche age: 47 No LMP recorded (lmp unknown). Patient has had an implant. Contraception: Nexplanon (inserted 01/2016) History of STI's: History of chlamydia (2013) Last Pap: 07/2015. Results were: normal.  Denies h/o abnormal pap smears.   OB History  Gravida Para Term Preterm AB Living  0 0 2  SAB TAB Ectopic Multiple Live Births  0 0 0 0 2    # Outcome Date GA Lbr Len/2nd Weight Sex Delivery Anes PTL Lv  2 Term 11/14/15 [redacted]w[redacted]d / 00:12 8 lb 10.3 oz (3.92 kg) M Vag-Spont EPI  LIV     Name: Rose [name]     Apgar1: 9  Apgar5: 9  1 Term 2011 [redacted]w[redacted]d   M Vag-Spont None N LIV    Obstetric Comments  Postpartum hemorrhage after 1st delivery, did not require blood transfusion.     Past Medical History:  Diagnosis Date  . Asthma   . History of postpartum hemorrhage   . Hx of chlamydia infection    +12/22/11, TOC neg   . IBD (inflammatory bowel disease)    Chron's disease  . 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   . Personal history UTI     Past Surgical History:  Procedure Laterality Date  . APPENDECTOMY  2013   acute appendicitis  . EYE SURGERY    . LAPAROSCOPIC APPENDECTOMY  02/03/2012   Procedure: APPENDECTOMY LAPAROSCOPIC;  Surgeon: Wilmon Arms. Corliss Skains, MD;  Location: [facility] OR;  Service: General;  Laterality: N/A;  . lymph node biopsy     Left neck after  cat scratch fever  . TYMPANOSTOMY TUBE PLACEMENT      Family History  Problem Relation Age of Onset  . COPD Mother   . Asthma Mother   . Lung cancer Maternal Grandmother     Social History   Socioeconomic History  . Marital status: Single    Spouse name: Not on file  . Number of children: 1  . Years of education: 55  . Highest education level: Not on file  Occupational History  . Occupation: Conservation officer, nature at [employer]  . Financial resource strain: Not on file  . Food insecurity:    Worry: Not on file    Inability: Not on file  . Transportation needs:    Medical: Not on file    Non-medical: Not on file  Tobacco Use  . Smoking status: Former Smoker    Types: Cigarettes    Last attempt to quit: 06/08/2015    Years since quitting: 1.9  . Smokeless tobacco: Never Used  . Tobacco comment: One pack per week.  Substance and Sexual Activity  . Alcohol use: Yes    Alcohol/week: 0.0 oz    Comment: Occasion use - socially  . Drug use: Not Currently    Types: Marijuana  . Sexual activity: Yes  Birth control/protection: Implant  Lifestyle  . Physical activity:    Days per week: Not on file    Minutes per session: Not on file  . Stress: Not on file  Relationships  . Social connections:    Talks on phone: Not on file    Gets together: Not on file    Attends religious service: Not on file    Active member of club or organization: Not on file    Attends meetings of clubs or organizations: Not on file    Relationship status: Not on file  . Intimate partner violence:    Fear of current or ex partner: Not on file    Emotionally abused: Not on file    Physically abused: Not on file    Forced sexual activity: Not on file  Other Topics Concern  . Not on file  Social History Narrative   Lives at home with her son.   Right-handed.   6-8 cups caffeine per day.    Current Outpatient Medications on File Prior to Visit  Medication Sig Dispense Refill  . acetaminophen  (TYLENOL) 500 MG tablet Take 500 mg by mouth every 6 (six) hours as needed.    Marland Kitchen ibuprofen (ADVIL,MOTRIN) 200 MG tablet Take 200 mg by mouth every 6 (six) hours as needed.    . loperamide (LOPERAMIDE A-D) 2 MG tablet Take 1 tablet (2 mg total) by mouth 4 (four) times daily as needed for diarrhea or loose stools. 30 tablet 0  . magic mouthwash w/lidocaine SOLN Take 5 mLs by mouth 4 (four) times daily. Swish and swallow. (Patient not taking: Reported on 06/05/2017) 120 mL 0  . methylPREDNISolone (MEDROL DOSEPAK) 4 MG TBPK tablet Take Tapered dose as directed (Patient not taking: Reported on 06/05/2017) 21 tablet 0   No current facility-administered medications on file prior to visit.     Allergies  Allergen Reactions  . Penicillins Hives    Has patient had a PCN reaction causing immediate rash, facial/tongue/throat swelling, SOB or lightheadedness with hypotension: Yes Has patient had a PCN reaction causing severe rash involving mucus membranes or skin necrosis: No Has patient had a PCN reaction that required hospitalization Yes Has patient had a PCN reaction occurring within the last 10 years: No If all of the above answers are "NO", then may proceed with Cephalosporin use.      Review of Systems Constitutional: negative for chills, fatigue, fevers and sweats Eyes: negative for irritation, redness and visual disturbance Ears, nose, mouth, throat, and face: negative for hearing loss, nasal congestion, snoring and tinnitus Respiratory: negative for asthma, cough, sputum Cardiovascular: negative for chest pain, dyspnea, exertional chest pressure/discomfort, irregular heart beat, palpitations and syncope Gastrointestinal: negative for abdominal pain, change in bowel habits, nausea and vomiting Genitourinary: negative for abnormal menstrual periods, genital lesions, sexual problems and vaginal discharge, dysuria and urinary incontinence Integument/breast: negative for breast lump, breast  tenderness and nipple discharge Hematologic/lymphatic: negative for bleeding and easy bruising Musculoskeletal:negative for back pain and muscle weakness Neurological:   Negative for headaches, dizziness, vertigo and weakness Endocrine: negative for diabetic symptoms including polydipsia, polyuria and skin dryness Allergic/Immunologic: negative for hay fever and urticaria        Objective:  Blood pressure 103/69, pulse 76, height  (1.727 m), weight 217 lb 9.6 oz (98.7 kg), not currently breastfeeding. Body mass index is 33.09 kg/m.  General Appearance:    Alert, cooperative, no distress, appears stated age, mild obesity  Head:  Normocephalic, without obvious abnormality, atraumatic  Eyes:    PERRL, conjunctiva/corneas clear, EOM's intact, both eyes  Ears:    Normal external ear canals, both ears  Nose:   Nares normal, septum midline, mucosa normal, no drainage or sinus tenderness  Throat:   Lips, mucosa, and tongue normal; teeth and gums normal  Neck:   Supple, symmetrical, trachea midline, no adenopathy; thyroid: no enlargement/tenderness/nodules; no carotid bruit or JVD  Back:     Symmetric, no curvature, ROM normal, no CVA tenderness  Lungs:     Clear to auscultation bilaterally, respirations unlabored  Chest Wall:    No tenderness or deformity   Heart:    Regular rate and rhythm, S1 and S2 normal, no murmur, rub or gallop  Breast Exam:    No tenderness, masses, or nipple abnormality  Abdomen:     Soft, non-tender, bowel sounds active all four quadrants, no masses, no organomegaly.    Genitalia:    Pelvic:external genitalia normal, vagina without lesions, discharge, or tenderness, rectovaginal septum  normal. Cervix normal in appearance, no cervical motion tenderness, no adnexal masses or tenderness.  Uterus normal size, shape, mobile, regular contours, nontender.  Rectal:    Normal external sphincter.  No hemorrhoids appreciated. Internal exam not done.   Extremities:    Extremities normal, atraumatic, no cyanosis or edema  Pulses:   2+ and symmetric all extremities  Skin:   Skin color, texture, turgor normal, no rashes or lesions  Lymph nodes:   Cervical, supraclavicular, and axillary nodes normal  Neurologic:   CNII-XII intact, normal strength, sensation and reflexes throughout   .  Labs:  Lab Results  Component Value Date   WBC 5.0 06/08/2016   HGB 14.9 06/08/2016   HCT 44.0 06/08/2016   MCV 88.9 06/08/2016   PLT 158 06/08/2016    Lab Results  Component Value Date   CREATININE 0.79 06/08/2016   BUN 9 06/08/2016   NA 134 (L) 06/08/2016   K 3.4 (L) 06/08/2016   CL 104 06/08/2016   CO2 21 (L) 06/08/2016    Lab Results  Component Value Date   ALT 18 06/08/2016   AST 20 06/08/2016   ALKPHOS 60 06/08/2016   BILITOT 0.5 06/08/2016    Lab Results  Component Value Date   TSH 1.430 04/03/2015     Assessment:    Healthy female exam.  Nexplanon in place Mild obesity STD screening  Plan:     Blood tests: CBC, CMP.  Breast self exam technique reviewed and patient encouraged to perform self-exam monthly. Contraception: Nexplanon.  No major complaints at this time. Due for removal in 2021. Discussed healthy lifestyle modifications. Pap smear up to date.  Due next year.  Patient requests STD testing. Will perform. Discussed safe sex practices.  Follow up in 1 year for annual exam, as needed.    Hildred Laser, MD [facility]

## 2017-06-05 NOTE — Patient Instructions (Signed)
Preventive Care 18-39 Years, Female Preventive care refers to lifestyle choices and visits with your health care provider that can promote health and wellness. What does preventive care include?  A yearly physical exam. This is also called an annual well check.  Dental exams once or twice a year.  Routine eye exams. Ask your health care provider how often you should have your eyes checked.  Personal lifestyle choices, including: ? Daily care of your teeth and gums. ? Regular physical activity. ? Eating a healthy diet. ? Avoiding tobacco and drug use. ? Limiting alcohol use. ? Practicing safe sex. ? Taking vitamin and mineral supplements as recommended by your health care provider. What happens during an annual well check? The services and screenings done by your health care provider during your annual well check will depend on your age, overall health, lifestyle risk factors, and family history of disease. Counseling Your health care provider may ask you questions about your:  Alcohol use.  Tobacco use.  Drug use.  Emotional well-being.  Home and relationship well-being.  Sexual activity.  Eating habits.  Work and work Statistician.  Method of birth control.  Menstrual cycle.  Pregnancy history.  Screening You may have the following tests or measurements:  Height, weight, and BMI.  Diabetes screening. This is done by checking your blood sugar (glucose) after you have not eaten for a while (fasting).  Blood pressure.  Lipid and cholesterol levels. These may be checked every 5 years starting at age 38.  Skin check.  Hepatitis C blood test.  Hepatitis B blood test.  Sexually transmitted disease (STD) testing.  BRCA-related cancer screening. This may be done if you have a family history of breast, ovarian, tubal, or peritoneal cancers.  Pelvic exam and Pap test. This may be done every 3 years starting at age 38. Starting at age 30, this may be done  every 5 years if you have a Pap test in combination with an HPV test.  Discuss your test results, treatment options, and if necessary, the need for more tests with your health care provider. Vaccines Your health care provider may recommend certain vaccines, such as:  Influenza vaccine. This is recommended every year.  Tetanus, diphtheria, and acellular pertussis (Tdap, Td) vaccine. You may need a Td booster every 10 years.  Varicella vaccine. You may need this if you have not been vaccinated.  HPV vaccine. If you are 39 or younger, you may need three doses over 6 months.  Measles, mumps, and rubella (MMR) vaccine. You may need at least one dose of MMR. You may also need a second dose.  Pneumococcal 13-valent conjugate (PCV13) vaccine. You may need this if you have certain conditions and were not previously vaccinated.  Pneumococcal polysaccharide (PPSV23) vaccine. You may need one or two doses if you smoke cigarettes or if you have certain conditions.  Meningococcal vaccine. One dose is recommended if you are age 68-21 years and a first-year college student living in a residence hall, or if you have one of several medical conditions. You may also need additional booster doses.  Hepatitis A vaccine. You may need this if you have certain conditions or if you travel or work in places where you may be exposed to hepatitis A.  Hepatitis B vaccine. You may need this if you have certain conditions or if you travel or work in places where you may be exposed to hepatitis B.  Haemophilus influenzae type b (Hib) vaccine. You may need this  if you have certain risk factors.  Talk to your health care provider about which screenings and vaccines you need and how often you need them. This information is not intended to replace advice given to you by your health care provider. Make sure you discuss any questions you have with your health care provider. Document Released: 02/19/2001 Document Revised:  09/13/2015 Document Reviewed: 10/25/2014 Elsevier Interactive Patient Education  2018 Elsevier Inc.  

## 2017-06-05 NOTE — Progress Notes (Signed)
Pt is present today for annual exam. Pt stated that she is doing well.  

## 2017-06-06 LAB — COMPREHENSIVE METABOLIC PANEL
ALBUMIN: 4.5 g/dL (ref 3.5–5.5)
ALT: 18 IU/L (ref 0–32)
AST: 15 IU/L (ref 0–40)
Albumin/Globulin Ratio: 1.8 (ref 1.2–2.2)
Alkaline Phosphatase: 61 IU/L (ref 39–117)
BUN / CREAT RATIO: 13 (ref 9–23)
BUN: 9 mg/dL (ref 6–20)
Bilirubin Total: 0.4 mg/dL (ref 0.0–1.2)
CO2: 18 mmol/L — ABNORMAL LOW (ref 20–29)
Calcium: 9.4 mg/dL (ref 8.7–10.2)
Chloride: 106 mmol/L (ref 96–106)
Creatinine, Ser: 0.72 mg/dL (ref 0.57–1.00)
GFR calc Af Amer: 135 mL/min/{1.73_m2} (ref >59)
GFR calc non Af Amer: 117 mL/min/{1.73_m2} (ref >59)
GLUCOSE: 76 mg/dL (ref 65–99)
Globulin, Total: 2.5 g/dL (ref 1.5–4.5)
Potassium: 4.3 mmol/L (ref 3.5–5.2)
SODIUM: 138 mmol/L (ref 134–144)
Total Protein: 7 g/dL (ref 6.0–8.5)

## 2017-06-06 LAB — RPR: RPR Ser Ql: NONREACTIVE

## 2017-06-06 LAB — CBC
HEMOGLOBIN: 15.3 g/dL (ref 11.1–15.9)
Hematocrit: 44.5 % (ref 34.0–46.6)
MCH: 31.2 pg (ref 26.6–33.0)
MCHC: 34.4 g/dL (ref 31.5–35.7)
MCV: 91 fL (ref 79–97)
Platelets: 240 10*3/uL (ref 150–450)
RBC: 4.91 x10E6/uL (ref 3.77–5.28)
RDW: 13.6 % (ref 12.3–15.4)
WBC: 7.5 10*3/uL (ref 3.4–10.8)

## 2017-06-06 LAB — HIV ANTIBODY (ROUTINE TESTING W REFLEX): HIV SCREEN 4TH GENERATION: NONREACTIVE

## 2017-06-08 ENCOUNTER — Encounter: Payer: Self-pay | Admitting: Obstetrics and Gynecology

## 2017-06-08 DIAGNOSIS — Z975 Presence of (intrauterine) contraceptive device: Secondary | ICD-10-CM | POA: Insufficient documentation

## 2017-06-08 DIAGNOSIS — E669 Obesity, unspecified: Secondary | ICD-10-CM | POA: Insufficient documentation

## 2017-06-11 LAB — GC/CHLAMYDIA PROBE AMP
Chlamydia trachomatis, NAA: NEGATIVE
Neisseria gonorrhoeae by PCR: NEGATIVE

## 2017-06-13 ENCOUNTER — Telehealth: Payer: Self-pay | Admitting: Obstetrics and Gynecology

## 2017-06-13 NOTE — Telephone Encounter (Signed)
[facility] [[facility]] called needing a requisition for a pap they received done on May 30 for this patient.  Her fax # [phone].  Thank you

## 2017-07-04 LAB — SPECIMEN STATUS REPORT

## 2017-07-04 LAB — PAP LB (LIQUID-BASED): PAP Smear Comment: 0

## 2018-06-10 ENCOUNTER — Encounter: Payer: Medicaid Other | Admitting: Obstetrics and Gynecology

## 2018-11-18 ENCOUNTER — Ambulatory Visit (INDEPENDENT_AMBULATORY_CARE_PROVIDER_SITE_OTHER): Payer: 59 | Admitting: Obstetrics and Gynecology

## 2018-11-18 ENCOUNTER — Other Ambulatory Visit (HOSPITAL_COMMUNITY)
Admission: RE | Admit: 2018-11-18 | Discharge: 2018-11-18 | Disposition: A | Discharge status: Home / Self Care | Payer: 59 | Source: Ambulatory Visit | Attending: Obstetrics and Gynecology | Admitting: Obstetrics and Gynecology

## 2018-11-18 ENCOUNTER — Other Ambulatory Visit: Payer: Self-pay

## 2018-11-18 ENCOUNTER — Encounter: Payer: Self-pay | Admitting: Obstetrics and Gynecology

## 2018-11-18 VITALS — BP 115/75 | HR 75 | Ht 68.0 in | Wt 226.7 lb

## 2018-11-18 DIAGNOSIS — Z131 Encounter for screening for diabetes mellitus: Secondary | ICD-10-CM

## 2018-11-18 DIAGNOSIS — Z3046 Encounter for surveillance of implantable subdermal contraceptive: Secondary | ICD-10-CM | POA: Diagnosis not present

## 2018-11-18 DIAGNOSIS — Z13220 Encounter for screening for lipoid disorders: Secondary | ICD-10-CM

## 2018-11-18 DIAGNOSIS — Z01419 Encounter for gynecological examination (general) (routine) without abnormal findings: Secondary | ICD-10-CM

## 2018-11-18 DIAGNOSIS — Z3049 Encounter for surveillance of other contraceptives: Secondary | ICD-10-CM | POA: Diagnosis not present

## 2018-11-18 DIAGNOSIS — Z113 Encounter for screening for infections with a predominantly sexual mode of transmission: Secondary | ICD-10-CM | POA: Insufficient documentation

## 2018-11-18 DIAGNOSIS — R638 Other symptoms and signs concerning food and fluid intake: Secondary | ICD-10-CM

## 2018-11-18 MED ORDER — ANNOVERA 0.013-0.15 MG/24HR VA RING: 1.0000 | VAGINAL_RING | VAGINAL | 0 refills | Status: DC

## 2018-11-18 NOTE — Progress Notes (Signed)
Pt present for annual exam, nexplanon removal and to discuss other birth control options. Pt already had flu vaccine.

## 2018-11-18 NOTE — Progress Notes (Signed)
GYNECOLOGY ANNUAL PHYSICAL EXAM PROGRESS NOTE  Subjective:    Rose [name] is a 28 y.o. G1P2002 female who presents for an annual exam and Nexplanon removal.  The patient is sexually active.  The patient wears seatbelts: yes. The patient participates in regular exercise: no. Has the patient ever been transfused or tattooed?: no. The patient reports that there is not domestic violence in her life.   The patient has the following complaints today: 1. None.    Gynecologic History  Menarche age: 40 No LMP recorded. Patient has had an implant. Contraception: Nexplanon (inserted 01/2016) History of STI's: History of chlamydia (2013) Last Pap: 05/2017. Results were: normal.  Denies h/o abnormal pap smears.   OB History  Gravida Para Term Preterm AB Living  2 2 2  0 0 2  SAB TAB Ectopic Multiple Live Births  0 0 0 0 2    # Outcome Date GA Lbr Len/2nd Weight Sex Delivery Anes PTL Lv  2 Term 11/14/15 [redacted]w[redacted]d / 00:12 8 lb 10.3 oz (3.92 kg) M Vag-Spont EPI  LIV     Name: [name],[name]     Apgar1: 9  Apgar5: 9  1 Term 2011 [redacted]w[redacted]d   M Vag-Spont None N LIV    Obstetric Comments  Postpartum hemorrhage after 1st delivery, did not require blood transfusion.     Past Medical History:  Diagnosis Date  . Asthma   . History of postpartum hemorrhage   . Hx of chlamydia infection    +12/22/11, TOC neg   . IBD (inflammatory bowel disease)    Chron's disease  . 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   . Personal history UTI     Past Surgical History:  Procedure Laterality Date  . APPENDECTOMY  2013   acute appendicitis  . EYE SURGERY    . LAPAROSCOPIC APPENDECTOMY  02/03/2012   Procedure: APPENDECTOMY LAPAROSCOPIC;  Surgeon: Imogene Burn. Georgette Dover, MD;  Location: [place] [facility];  Service: General;  Laterality: N/A;  . lymph node biopsy     Left neck after cat scratch fever  . TYMPANOSTOMY TUBE PLACEMENT      Family History  Problem  Relation Age of Onset  . COPD Mother   . Asthma Mother   . Lung cancer Maternal Grandmother     Social History   Socioeconomic History  . Marital status: Single    Spouse name: Not on file  . Number of children: 1  . Years of education: 29  . Highest education level: Not on file  Occupational History  . Occupation: Scientist, water quality at [employer]  . Financial resource strain: Not on file  . Food insecurity    Worry: Not on file    Inability: Not on file  . Transportation needs    Medical: Not on file    Non-medical: Not on file  Tobacco Use  . Smoking status: Current Some Day Smoker    Types: Cigarettes    Last attempt to quit: 06/08/2015    Years since quitting: 3.4  . Smokeless tobacco: Never Used  . Tobacco comment: One pack per week.  Substance and Sexual Activity  . Alcohol use: Yes    Alcohol/week: 0.0 standard drinks    Comment: Occasion use - socially  . Drug use: Not Currently    Types: Marijuana  . Sexual activity: Yes    Birth control/protection: Implant  Lifestyle  . Physical activity  Days per week: Not on file    Minutes per session: Not on file  . Stress: Not on file  Relationships  . Social Musician on phone: Not on file    Gets together: Not on file    Attends religious service: Not on file    Active member of club or organization: Not on file    Attends meetings of clubs or organizations: Not on file    Relationship status: Not on file  . Intimate partner violence    Fear of current or ex partner: Not on file    Emotionally abused: Not on file    Physically abused: Not on file    Forced sexual activity: Not on file  Other Topics Concern  . Not on file  Social History Narrative   Lives at home with her son.   Right-handed.   6-8 cups caffeine per day.    Current Outpatient Medications on File Prior to Visit  Medication Sig Dispense Refill  . acetaminophen (TYLENOL) 500 MG tablet Take 500 mg by mouth every 6 (six) hours as  needed.    Marland Kitchen ibuprofen (ADVIL,MOTRIN) 200 MG tablet Take 200 mg by mouth every 6 (six) hours as needed.     No current facility-administered medications on file prior to visit.     Allergies  Allergen Reactions  . Penicillins Hives    Has patient had a PCN reaction causing immediate rash, facial/tongue/throat swelling, SOB or lightheadedness with hypotension: Yes Has patient had a PCN reaction causing severe rash involving mucus membranes or skin necrosis: No Has patient had a PCN reaction that required hospitalization Yes Has patient had a PCN reaction occurring within the last 10 years: No If all of the above answers are "NO", then may proceed with Cephalosporin use.      Review of Systems Constitutional: negative for chills, fatigue, fevers and sweats Eyes: negative for irritation, redness and visual disturbance Ears, nose, mouth, throat, and face: negative for hearing loss, nasal congestion, snoring and tinnitus Respiratory: negative for asthma, cough, sputum Cardiovascular: negative for chest pain, dyspnea, exertional chest pressure/discomfort, irregular heart beat, palpitations and syncope Gastrointestinal: negative for abdominal pain, change in bowel habits, nausea and vomiting Genitourinary: negative for abnormal menstrual periods, genital lesions, sexual problems and vaginal discharge, dysuria and urinary incontinence Integument/breast: negative for breast lump, breast tenderness and nipple discharge Hematologic/lymphatic: negative for bleeding and easy bruising Musculoskeletal:negative for back pain and muscle weakness Neurological:   Negative for headaches, dizziness, vertigo and weakness Endocrine: negative for diabetic symptoms including polydipsia, polyuria and skin dryness Allergic/Immunologic: negative for hay fever and urticaria        Objective:  Blood pressure 115/75, pulse 75, height 5\' 8"  (1.727 m), weight 226 lb 11.2 oz (102.8 kg). Body mass index is 34.47  kg/m.  General Appearance:    Alert, cooperative, no distress, appears stated age, mild obesity  Head:    Normocephalic, without obvious abnormality, atraumatic  Eyes:    PERRL, conjunctiva/corneas clear, EOM's intact, both eyes  Ears:    Normal external ear canals, both ears  Nose:   Nares normal, septum midline, mucosa normal, no drainage or sinus tenderness  Throat:   Lips, mucosa, and tongue normal; teeth and gums normal  Neck:   Supple, symmetrical, trachea midline, no adenopathy; thyroid: no enlargement/tenderness/nodules; no carotid bruit or JVD  Back:     Symmetric, no curvature, ROM normal, no CVA tenderness  Lungs:     Clear  to auscultation bilaterally, respirations unlabored  Chest Wall:    No tenderness or deformity   Heart:    Regular rate and rhythm, S1 and S2 normal, no murmur, rub or gallop  Breast Exam:    No tenderness, masses, or nipple abnormality  Abdomen:     Soft, non-tender, bowel sounds active all four quadrants, no masses, no organomegaly.    Genitalia:    Pelvic:external genitalia normal, vagina without lesions, discharge, or tenderness, rectovaginal septum  normal. Cervix normal in appearance, no cervical motion tenderness, no adnexal masses or tenderness.  Uterus normal size, shape, mobile, regular contours, nontender.  Rectal:    Normal external sphincter.  No hemorrhoids appreciated. Internal exam not done.   Extremities:   Extremities normal, atraumatic, no cyanosis or edema  Pulses:   2+ and symmetric all extremities  Skin:   Skin color, texture, turgor normal, no rashes or lesions  Lymph nodes:   Cervical, supraclavicular, and axillary nodes normal  Neurologic:   CNII-XII intact, normal strength, sensation and reflexes throughout   .  Labs:  Lab Results  Component Value Date   WBC 7.5 06/05/2017   HGB 15.3 06/05/2017   HCT 44.5 06/05/2017   MCV 91 06/05/2017   PLT 240 06/05/2017    Lab Results  Component Value Date   CREATININE 0.72 06/05/2017    BUN 9 06/05/2017   NA 138 06/05/2017   K 4.3 06/05/2017   CL 106 06/05/2017   CO2 18 (L) 06/05/2017    Lab Results  Component Value Date   ALT 18 06/05/2017   AST 15 06/05/2017   ALKPHOS 61 06/05/2017   BILITOT 0.4 06/05/2017    Lab Results  Component Value Date   TSH 1.430 04/03/2015     Assessment:    Healthy female exam.  Nexplanon in place Mild obesity STD screening  Plan:     Blood tests: CBC, CMP, Lipid panel, TSH, HgbA1c.  Breast self exam technique reviewed and patient encouraged to perform self-exam monthly. Contraception: Nexplanon. Currently desires to change to vaginal ring (Annovera). Removal performed today. See procedure note below.  Discussed healthy lifestyle modifications. Pap smear up to date.  Patient requests STD testing. Will perform. Discussed safe sex practices.  Follow up in 1 year for annual exam, as needed.     GYNECOLOGY OFFICE PROCEDURE NOTE  Estelle A Dimmitt is a 28 y.o. [id number] here for Nexplanon removal. No other gynecologic concerns.     Nexplanon Removal Patient identified, informed consent performed, consent signed.   Appropriate time out taken. Nexplanon site identified.  Area prepped in usual sterile fashon. One ml of 1% lidocaine was used to anesthetize the area at the distal end of the implant. A small stab incision was made right beside the implant on the distal portion.  The Nexplanon rod was grasped using hemostats and removed without difficulty.  There was minimal blood loss. There were no complications.  3 ml of 1% lidocaine was injected around the incision for post-procedure analgesia.  Steri-strips were applied over the small incision.  A pressure bandage was applied to reduce any bruising.  The patient tolerated the procedure well and was given post procedure instructions.  Patient is planning to use Annovera for contraception/attempt conception.   Hildred Laserherry, Darreon Lutes, MD [facility]

## 2018-11-18 NOTE — Patient Instructions (Signed)
Health Maintenance, Female Adopting a healthy lifestyle and getting preventive care are important in promoting health and wellness. Ask your health care provider about:  The right schedule for you to have regular tests and exams.  Things you can do on your own to prevent diseases and keep yourself healthy. What should I know about diet, weight, and exercise? Eat a healthy diet   Eat a diet that includes plenty of vegetables, fruits, low-fat dairy products, and lean protein.  Do not eat a lot of foods that are high in solid fats, added sugars, or sodium. Maintain a healthy weight Body mass index (BMI) is used to identify weight problems. It estimates body fat based on height and weight. Your health care provider can help determine your BMI and help you achieve or maintain a healthy weight. Get regular exercise Get regular exercise. This is one of the most important things you can do for your health. Most adults should:  Exercise for at least 150 minutes each week. The exercise should increase your heart rate and make you sweat (moderate-intensity exercise).  Do strengthening exercises at least twice a week. This is in addition to the moderate-intensity exercise.  Spend less time sitting. Even light physical activity can be beneficial. Watch cholesterol and blood lipids Have your blood tested for lipids and cholesterol at 27 years of age, then have this test every 5 years. Have your cholesterol levels checked more often if:  Your lipid or cholesterol levels are high.  You are older than 27 years of age.  You are at high risk for heart disease. What should I know about cancer screening? Depending on your health history and family history, you may need to have cancer screening at various ages. This may include screening for:  Breast cancer.  Cervical cancer.  Colorectal cancer.  Skin cancer.  Lung cancer. What should I know about heart disease, diabetes, and high blood  pressure? Blood pressure and heart disease  High blood pressure causes heart disease and increases the risk of stroke. This is more likely to develop in people who have high blood pressure readings, are of African descent, or are overweight.  Have your blood pressure checked: ? Every 3-5 years if you are 18-39 years of age. ? Every year if you are 40 years old or older. Diabetes Have regular diabetes screenings. This checks your fasting blood sugar level. Have the screening done:  Once every three years after age 40 if you are at a normal weight and have a low risk for diabetes.  More often and at a younger age if you are overweight or have a high risk for diabetes. What should I know about preventing infection? Hepatitis B If you have a higher risk for hepatitis B, you should be screened for this virus. Talk with your health care provider to find out if you are at risk for hepatitis B infection. Hepatitis C Testing is recommended for:  Everyone born from 1945 through 1965.  Anyone with known risk factors for hepatitis C. Sexually transmitted infections (STIs)  Get screened for STIs, including gonorrhea and chlamydia, if: ? You are sexually active and are younger than 27 years of age. ? You are older than 27 years of age and your health care provider tells you that you are at risk for this type of infection. ? Your sexual activity has changed since you were last screened, and you are at increased risk for chlamydia or gonorrhea. Ask your health care provider if   you are at risk.  Ask your health care provider about whether you are at high risk for HIV. Your health care provider may recommend a prescription medicine to help prevent HIV infection. If you choose to take medicine to prevent HIV, you should first get tested for HIV. You should then be tested every 3 months for as long as you are taking the medicine. Pregnancy  If you are about to stop having your period (premenopausal) and  you may become pregnant, seek counseling before you get pregnant.  Take 400 to 800 micrograms (mcg) of folic acid every day if you become pregnant.  Ask for birth control (contraception) if you want to prevent pregnancy. Osteoporosis and menopause Osteoporosis is a disease in which the bones lose minerals and strength with aging. This can result in bone fractures. If you are 20 years old or older, or if you are at risk for osteoporosis and fractures, ask your health care provider if you should:  Be screened for bone loss.  Take a calcium or vitamin D supplement to lower your risk of fractures.  Be given hormone replacement therapy (HRT) to treat symptoms of menopause. Follow these instructions at home: Lifestyle  Do not use any products that contain nicotine or tobacco, such as cigarettes, e-cigarettes, and chewing tobacco. If you need help quitting, ask your health care provider.  Do not use street drugs.  Do not share needles.  Ask your health care provider for help if you need support or information about quitting drugs. Alcohol use  Do not drink alcohol if: ? Your health care provider tells you not to drink. ? You are pregnant, may be pregnant, or are planning to become pregnant.  If you drink alcohol: ? Limit how much you use to 0-1 drink a day. ? Limit intake if you are breastfeeding.  Be aware of how much alcohol is in your drink. In the [place] one drink equals one 12 oz bottle of beer (355 mL), one 5 oz glass of wine (148 mL), or one 1 oz glass of hard liquor (44 mL). General instructions  Schedule regular health, dental, and eye exams.  Stay current with your vaccines.  Tell your health care provider if: ? You often feel depressed. ? You have ever been abused or do not feel safe at home. Summary  Adopting a healthy lifestyle and getting preventive care are important in promoting health and wellness.  Follow your health care provider's instructions about healthy  diet, exercising, and getting tested or screened for diseases.  Follow your health care provider's instructions on monitoring your cholesterol and blood pressure. This information is not intended to replace advice given to you by your health care provider. Make sure you discuss any questions you have with your health care provider. Document Released: 07/09/2010 Document Revised: 12/17/2017 Document Reviewed: 12/17/2017 Elsevier Patient Education  Rudy Acetate; Ethinyl Estradiol vaginal ring What is this medicine? SEGESTERONE ACETATE; ETHINYL ESTRADIOL (se JES ter one; ETH in il es tra DYE ole) vaginal ring is a flexible, vaginal ring used as a contraceptive (birth control method). This medicine combines 2 types of female hormones, an estrogen and a progestin. This ring is used to prevent ovulation and pregnancy. This medicine may be used for other purposes; ask your health care provider or pharmacist if you have questions. COMMON BRAND NAME(S): Annovera What should I tell my health care provider before I take this medicine? They need to know if you  have any of these conditions:  abnormal vaginal bleeding  blood vessel disease or blood clots  breast, cervical, endometrial, ovarian, liver, or uterine cancer  diabetes  gallbladder disease  having surgery  heart disease or recent heart attack  high blood pressure  high cholesterol or triglycerides  history of irregular heartbeat or heart valve problems  kidney disease  liver disease  migraine headaches  protein C deficiency  protein S deficiency  recently had a baby, miscarriage, or abortion  stroke  systemic lupus erythematosus (SLE)  tobacco smoker  an unusual or allergic reaction to estrogens, progestins, other medicines, foods, dyes, or preservatives  pregnant or trying to get pregnant  breast-feeding How should I use this medicine? Insert the ring into your vagina as  directed. Follow the directions on the prescription label. The ring will remain in place for 3 weeks and is then removed for a 1-week break. The ring should be cleaned and then inserted 1 week after the last ring was removed, on the same day of the week. Check often to make sure the ring is still in place. If the ring was out of the vagina for an unknown amount of time, you may not be protected from pregnancy. Perform a pregnancy test and call your doctor. Do not use more often than directed. A patient package insert for the product will be given with each prescription and refill. Read this sheet carefully each time. The sheet may change frequently. Contact your pediatrician regarding the use of this medicine in children. Special care may be needed. Overdosage: If you think you have taken too much of this medicine contact a poison control center or emergency room at once. NOTE: This medicine is only for you. Do not share this medicine with others. What if I miss a dose? You will need to use the ring exactly as directed. It is very important to follow the schedule every cycle. If you do not use the ring as directed, you may not be protected from pregnancy. If the ring should slip out, is lost, or if you leave it in longer or shorter than you should, contact your health care professional for advice. What may interact with this medicine? Do not take this medicine with the following medication:  dasabuvir; ombitasvir; paritaprevir; ritonavir  ombitasvir; paritaprevir; ritonavir  vaginal lubricants or other vaginal products that are oil-based or silicone-based This medicine may also interact with the following medications:  acetaminophen  antibiotics or medicines for infections, especially rifampin, rifabutin, rifapentine, and griseofulvin, and possibly penicillins or tetracyclines  aprepitant or fosaprepitant  armodafinil  ascorbic acid (vitamin C)  barbiturate medicines, such as phenobarbital  or primidone  bosentan  certain antiviral medicines for hepatitis, HIV or AIDS  certain medicines for cancer treatment  certain medicines for seizures like carbamazepine, clobazam, felbamate, lamotrigine, oxcarbazepine, phenytoin, rufinamide, topiramate  certain medicines for treating high cholesterol  cyclosporine  dantrolene  elagolix  flibanserin  grapefruit juice  lesinurad  medicines for diabetes  medicines to treat fungal infections, such as griseofulvin, miconazole, fluconazole, ketoconazole, itraconazole, posaconazole or voriconazole  mifepristone  mitotane  modafinil  morphine  mycophenolate  St. John's wort  tamoxifen  temazepam  theophylline or aminophylline  thyroid hormones  tizanidine  tranexamic acid  ulipristal  warfarin This list may not describe all possible interactions. Give your health care provider a list of all the medicines, herbs, non-prescription drugs, or dietary supplements you use. Also tell them if you smoke, drink alcohol, or use illegal  drugs. Some items may interact with your medicine. What should I watch for while using this medicine? Visit your doctor or health care professional for regular checks on your progress. You will need a regular breast and pelvic exam and Pap smear while on this medicine. Check with your doctor or health care professional to see if you need an additional method of contraception during the first cycle that you use this ring. Female condoms (made with natural rubber latex, polyisoprene, and polyurethane) and spermicides may be used. Do not use a diaphragm, cervical cap, or a female condom, as it is unknown if this ring can interfere with these birth control methods and their proper placement. If you have any reason to think you are pregnant, stop using this medicine right away and contact your doctor or health care professional. If you are taking this medicine for hormone related problems, it may  take several cycles of use to see improvement in your condition. Smoking increases the risk of getting a blood clot or having a stroke while you are using hormonal birth control, especially if you are more than 27 years old. You are strongly advised not to smoke. Some women are prone to getting dark patches on the skin of the face (chloasma). Your risk of getting chloasma with this medicine is higher if you had chloasma during a pregnancy. Keep out of the sun. If you cannot avoid being in the sun, wear protective clothing and use sunscreen. Do not use sun lamps or tanning beds/booths. This medicine can make your body retain fluid, making your fingers, hands, or ankles swell. Your blood pressure can go up. Contact your doctor or health care professional if you feel you are retaining fluid. If you are going to have elective surgery, you may need to stop using this medicine before the surgery. Consult your health care professional for advice. This medicine does not protect you against HIV infection (AIDS) or any other sexually transmitted diseases. What side effects may I notice from receiving this medicine? Side effects that you should report to your doctor or health care professional as soon as possible:  allergic reactions such as skin rash or itching, hives, swelling of the lips, mouth, tongue, or throat  depression  high blood pressure  migraines or severe, sudden headaches  signs and symptoms of a blood clot such as breathing problems; changes in vision; chest pain; severe, sudden headache; pain, swelling, warmth in the leg; trouble speaking; sudden numbness or weakness of the face, arm or leg  signs and symptoms of infection like fever or chills with dizziness and a sunburn-like rash, or pain or trouble passing urine  stomach pain  symptoms of vaginal infection like itching, irritation or unusual discharge  yellowing of the eyes or skin Side effects that usually do not require medical  attention (report these to your doctor or health care professional if they continue or are bothersome):  breast pain, tenderness  diarrhea  irregular vaginal bleeding or spotting, particularly during the first month of use  mild headache  nausea  painful periods  vomiting This list may not describe all possible side effects. Call your doctor for medical advice about side effects. You may report side effects to FDA at 1-800-FDA-1088. Where should I keep my medicine? Keep out of the reach of children. Store at room temperature between 15 and 30 degrees C (59 and 86 degrees F). Protect from light. Between each cycle of use, wash and dry the ring as directed and  store in the case. Throw away any unused medicine after the expiration date. The ring should be placed in the case after 13 cycles (1 year) of use and discarded via a drug take-back option, if available. If not, then discard in the trash out of reach of children and pets. Do NOT flush down the toilet. NOTE: This sheet is a summary. It may not cover all possible information. If you have questions about this medicine, talk to your doctor, pharmacist, or health care provider.  2020 Elsevier/Gold Standard (2017-11-06 14:47:34)

## 2018-11-18 NOTE — Addendum Note (Signed)
Addended by: Augusto Gamble on: 11/18/2018 09:50 PM   Modules accepted: Orders

## 2018-11-19 ENCOUNTER — Telehealth: Payer: Self-pay | Admitting: Obstetrics and Gynecology

## 2018-11-19 NOTE — Telephone Encounter (Signed)
The patient called and stated that her bc that was sent into the pharmacy is over $2000. The patient is wanting to have a different prescription sent in as soon as possible. Please advise.

## 2018-11-20 LAB — GC/CHLAMYDIA PROBE AMP
Chlamydia trachomatis, NAA: NEGATIVE
Neisseria Gonorrhoeae by PCR: NEGATIVE

## 2018-11-23 LAB — CYTOLOGY - PAP
Comment: NEGATIVE
Diagnosis: NEGATIVE
Trichomonas: NEGATIVE

## 2018-11-23 NOTE — Telephone Encounter (Signed)
Pt is aware that [facility] will be notified that the medication cost. Pt was informed that as soon as [facility] received the message and change her medication I will give her a call.

## 2018-11-26 ENCOUNTER — Other Ambulatory Visit: Payer: Self-pay

## 2018-11-26 ENCOUNTER — Telehealth: Payer: Self-pay

## 2018-11-26 MED ORDER — ETONOGESTREL-ETHINYL ESTRADIOL 0.12-0.015 MG/24HR VA RING: VAGINAL_RING | VAGINAL | 12 refills | Status: DC

## 2018-11-26 NOTE — Telephone Encounter (Signed)
Spoke with pt concerning her insurance not covering [place]. Informed pt that I would reach out to the rep to see if there are any discount cards or program for this medication. Until then nuvring was prescribed to the pt for birth control.

## 2018-11-26 NOTE — Telephone Encounter (Signed)
Pt called no answer number busy.

## 2018-11-30 ENCOUNTER — Other Ambulatory Visit: Payer: Self-pay

## 2018-11-30 DIAGNOSIS — Z20822 Contact with and (suspected) exposure to covid-19: Secondary | ICD-10-CM

## 2018-12-01 NOTE — Telephone Encounter (Signed)
Pt returned call stating she will stay on the NuvaRing.

## 2018-12-01 NOTE — Telephone Encounter (Signed)
Pt called no answer LM via VM that I had received and completed several forms for PA and appeal letter for the medication Annovera. Pt was advised to please call the office to discussion more about the situation.

## 2018-12-01 NOTE — Telephone Encounter (Signed)
Please see another phone encounter.  

## 2018-12-02 LAB — NOVEL CORONAVIRUS, NAA: SARS-CoV-2, NAA: NOT DETECTED

## 2018-12-07 NOTE — Telephone Encounter (Signed)
Please see my chart messages

## 2019-05-20 ENCOUNTER — Other Ambulatory Visit (HOSPITAL_COMMUNITY)
Admission: RE | Admit: 2019-05-20 | Discharge: 2019-05-20 | Disposition: A | Discharge status: Home / Self Care | Payer: 59 | Source: Ambulatory Visit | Attending: Obstetrics and Gynecology | Admitting: Obstetrics and Gynecology

## 2019-05-20 ENCOUNTER — Ambulatory Visit (INDEPENDENT_AMBULATORY_CARE_PROVIDER_SITE_OTHER): Payer: 59 | Admitting: Obstetrics and Gynecology

## 2019-05-20 ENCOUNTER — Encounter: Payer: Self-pay | Admitting: Obstetrics and Gynecology

## 2019-05-20 ENCOUNTER — Other Ambulatory Visit: Payer: Self-pay

## 2019-05-20 VITALS — BP 122/79 | HR 78 | Ht 68.0 in | Wt 232.2 lb

## 2019-05-20 DIAGNOSIS — Z113 Encounter for screening for infections with a predominantly sexual mode of transmission: Secondary | ICD-10-CM | POA: Diagnosis present

## 2019-05-20 DIAGNOSIS — R399 Unspecified symptoms and signs involving the genitourinary system: Secondary | ICD-10-CM

## 2019-05-20 LAB — POCT URINALYSIS DIPSTICK
Bilirubin, UA: NEGATIVE
Blood, UA: NEGATIVE
Glucose, UA: NEGATIVE
Ketones, UA: NEGATIVE
Nitrite, UA: NEGATIVE
Protein, UA: NEGATIVE
Spec Grav, UA: 1.02 (ref 1.010–1.025)
Urobilinogen, UA: 0.2 E.U./dL
pH, UA: 6 (ref 5.0–8.0)

## 2019-05-20 NOTE — Progress Notes (Signed)
Subjective:    Rose [name] is a 29 y.o. female who complains of burning with urination, frequency, incomplete bladder emptying and suprapubic pressure for 2 weeks.  Patient also complains of back pain (but feels this is more musculoskeletal). Patient denies fever and vaginal discharge.  Patient does have a history of recurrent UTI (~ 6 years ago for a span of 2 years).  Patient does have a history of pyelonephritis.  She has not taken anything over the counter but has tried to increase her water intake.    She also desires STI testing today. Notes that her husband has admitted to infidelity ~ 3 weeks ago.  The following portions of the patient's history were reviewed and updated as appropriate: allergies, current medications, past family history, past medical history, past social history, past surgical history and problem list.   Review of Systems Pertinent items noted in HPI and remainder of comprehensive ROS otherwise negative.    Objective:    BP 122/79   Pulse 78   Ht 5\' 8"  (1.727 m)   Wt 232 lb 3.2 oz (105.3 kg)   LMP 05/11/2019   BMI 35.31 kg/m  General: alert and no distress  Abdomen: Soft, mildly tender in the  suprapubic region  Back: back muscles are normal, CVA tenderness absent  GU: external genitalia normal, rectovaginal septum normal.  Vagina with thin, yellow-white discharge.  Cervix normal appearing, no lesions and mild motion tenderness.  Uterus mobile, nontender, normal shape and size.  Adnexae non-palpable, nontender bilaterally.   Extremities:  Non-tender, no edema  Neuro:  Grossly intact   Laboratory:  Results for orders placed or performed in visit on 05/20/19  POCT urinalysis dipstick  Result Value Ref Range   Color, UA yellow    Clarity, UA clear    Glucose, UA Negative Negative   Bilirubin, UA neg    Ketones, UA neg    Spec Grav, UA 1.020 1.010 - 1.025   Blood, UA neg    pH, UA 6.0 5.0 - 8.0   Protein, UA Negative Negative   Urobilinogen, UA  0.2 0.2 or 1.0 E.U./dL   Nitrite, UA neg    Leukocytes, UA Moderate (2+) (A) Negative   Appearance yellow;clear    Odor       Assessment:    UTI symptoms  STD screening  Plan:    1. Medications: Given samples of Uribel. no evidence of UTI at this time. Will treat if vaginitis screen returns postiive. Nuswab performed today 2. Maintain adequate hydration 3. Follow up if symptoms not improving, and prn.     I have seen and examined the patient with 05/22/19, [place] PA-S.  I have reviewed the record and concur with patient management and plan.   Aris Lot, MD [facility]   Hildred Laser, MD [facility]

## 2019-05-20 NOTE — Progress Notes (Signed)
Pt present today for possible uti symptoms or std. Pt c/o of pain with urination and frequency in urination x 2 weeks. Pt also stated that she would like to be tested for STD.

## 2019-05-20 NOTE — Patient Instructions (Signed)
Urinary Tract Infection, Adult A urinary tract infection (UTI) is an infection of any part of the urinary tract. The urinary tract includes:  The kidneys.  The ureters.  The bladder.  The urethra. These organs make, store, and get rid of pee (urine) in the body. What are the causes? This is caused by germs (bacteria) in your genital area. These germs grow and cause swelling (inflammation) of your urinary tract. What increases the risk? You are more likely to develop this condition if:  You have a small, thin tube (catheter) to drain pee.  You cannot control when you pee or poop (incontinence).  You are female, and: ? You use these methods to prevent pregnancy:  A medicine that kills sperm (spermicide).  A device that blocks sperm (diaphragm). ? You have low levels of a female hormone (estrogen). ? You are pregnant.  You have genes that add to your risk.  You are sexually active.  You take antibiotic medicines.  You have trouble peeing because of: ? A prostate that is bigger than normal, if you are female. ? A blockage in the part of your body that drains pee from the bladder (urethra). ? A kidney stone. ? A nerve condition that affects your bladder (neurogenic bladder). ? Not getting enough to drink. ? Not peeing often enough.  You have other conditions, such as: ? Diabetes. ? A weak disease-fighting system (immune system). ? Sickle cell disease. ? Gout. ? Injury of the spine. What are the signs or symptoms? Symptoms of this condition include:  Needing to pee right away (urgently).  Peeing often.  Peeing small amounts often.  Pain or burning when peeing.  Blood in the pee.  Pee that smells bad or not like normal.  Trouble peeing.  Pee that is cloudy.  Fluid coming from the vagina, if you are female.  Pain in the belly or lower back. Other symptoms include:  Throwing up (vomiting).  No urge to eat.  Feeling mixed up (confused).  Being tired  and grouchy (irritable).  A fever.  Watery poop (diarrhea). How is this treated? This condition may be treated with:  Antibiotic medicine.  Other medicines.  Drinking enough water. Follow these instructions at home:  Medicines  Take over-the-counter and prescription medicines only as told by your doctor.  If you were prescribed an antibiotic medicine, take it as told by your doctor. Do not stop taking it even if you start to feel better. General instructions  Make sure you: ? Pee until your bladder is empty. ? Do not hold pee for a long time. ? Empty your bladder after sex. ? Wipe from front to back after pooping if you are a female. Use each tissue one time when you wipe.  Drink enough fluid to keep your pee pale yellow.  Keep all follow-up visits as told by your doctor. This is important. Contact a doctor if:  You do not get better after 1-2 days.  Your symptoms go away and then come back. Get help right away if:  You have very bad back pain.  You have very bad pain in your lower belly.  You have a fever.  You are sick to your stomach (nauseous).  You are throwing up. Summary  A urinary tract infection (UTI) is an infection of any part of the urinary tract.  This condition is caused by germs in your genital area.  There are many risk factors for a UTI. These include having a small, thin   tube to drain pee and not being able to control when you pee or poop.  Treatment includes antibiotic medicines for germs.  Drink enough fluid to keep your pee pale yellow. This information is not intended to replace advice given to you by your health care provider. Make sure you discuss any questions you have with your health care provider. Document Revised: 12/11/2017 Document Reviewed: 07/03/2017 Elsevier Patient Education  2020 Elsevier Inc.  

## 2019-05-21 LAB — CERVICOVAGINAL ANCILLARY ONLY
Bacterial Vaginitis (gardnerella): POSITIVE — AB
Candida Glabrata: NEGATIVE
Candida Vaginitis: NEGATIVE
Chlamydia: NEGATIVE
Comment: NEGATIVE
Comment: NEGATIVE
Comment: NEGATIVE
Comment: NEGATIVE
Comment: NEGATIVE
Comment: NORMAL
Neisseria Gonorrhea: NEGATIVE
Trichomonas: NEGATIVE

## 2019-05-21 MED ORDER — METRONIDAZOLE 500 MG PO TABS: 500.0000 mg | ORAL_TABLET | Freq: Two times a day (BID) | ORAL | 0 refills | Status: AC

## 2019-05-21 NOTE — Addendum Note (Signed)
Addended by: Fabian November on: 05/21/2019 01:33 PM   Modules accepted: Orders

## 2019-10-26 ENCOUNTER — Telehealth: Payer: Self-pay

## 2019-10-26 NOTE — Telephone Encounter (Signed)
Pt called in and stated that she is having the same Symptoms of UTI or possible STD. The pt was told that her provider is out of the office until next week but her next appt is 11/3 the pt is requesting a call back from the nurse. The pt was wanting to see if there was something over the courtier to take until her appt. Please advise

## 2019-11-02 NOTE — Telephone Encounter (Signed)
Pt stated having burning with urination and abd pain. Pt stated that she it may not be an uti but more like BV. Pt is having bleeding with sex, discharge with odor, and some pain during sex. Pt's was scheduled for 11/10/2019 and was able to be moved to tomorrow.

## 2019-11-02 NOTE — Telephone Encounter (Signed)
Pt called number busy and no vm picked up.

## 2019-11-03 ENCOUNTER — Encounter: Payer: Self-pay | Admitting: Obstetrics and Gynecology

## 2019-11-03 ENCOUNTER — Other Ambulatory Visit (HOSPITAL_COMMUNITY)
Admission: RE | Admit: 2019-11-03 | Discharge: 2019-11-03 | Disposition: A | Discharge status: Home / Self Care | Payer: 59 | Source: Ambulatory Visit | Attending: Obstetrics and Gynecology | Admitting: Obstetrics and Gynecology

## 2019-11-03 ENCOUNTER — Other Ambulatory Visit: Payer: Self-pay

## 2019-11-03 ENCOUNTER — Ambulatory Visit (INDEPENDENT_AMBULATORY_CARE_PROVIDER_SITE_OTHER): Payer: 59 | Admitting: Obstetrics and Gynecology

## 2019-11-03 VITALS — BP 120/78 | HR 69 | Ht 68.0 in | Wt 234.6 lb

## 2019-11-03 DIAGNOSIS — R638 Other symptoms and signs concerning food and fluid intake: Secondary | ICD-10-CM

## 2019-11-03 DIAGNOSIS — Z113 Encounter for screening for infections with a predominantly sexual mode of transmission: Secondary | ICD-10-CM

## 2019-11-03 DIAGNOSIS — N930 Postcoital and contact bleeding: Secondary | ICD-10-CM

## 2019-11-03 NOTE — Progress Notes (Signed)
    GYNECOLOGY PROGRESS NOTE  Subjective:    Patient ID: Rose [name], female    DOB: 1990/01/31, 29 y.o.   MRN: [id number]  HPI  Patient is a 29 y.o. G49P2002 female who presents for postcoital bleeding x 1 month.  Notes that she thinks she might have BV again as this happened the last time, along with a vaginal discharge.  Currently using NuvaRing for birth control.   The following portions of the patient's history were reviewed and updated as appropriate: allergies, current medications, past family history, past medical history, past social history, past surgical history and problem list.  Review of Systems A comprehensive review of systems was negative except for: Constitutional: positive for weight gain.  Reports that she has been having trouble losing weight.  Dieting and exercising for 3 months with no succees with weight loss. Has issues with her body image which is upsetting her and making her slightly depressed.   Objective:   Blood pressure 120/78, pulse 69, height 5\' 8"  (1.727 m), weight 234 lb 9.6 oz (106.4 kg), last menstrual period 10/11/2019. Body mass index is 35.67 kg/m.  Wt Readings from Last 3 Encounters:  11/03/19 234 lb 9.6 oz (106.4 kg)  05/20/19 232 lb 3.2 oz (105.3 kg)  11/18/18 226 lb 11.2 oz (102.8 kg)    General appearance: alert, cooperative and no distress Abdomen: soft, non-tender; bowel sounds normal; no masses,  no organomegaly Pelvic: external genitalia normal, rectovaginal septum normal.  Vagina with moderate amount of thin white discharge.  Cervix normal appearing, no lesions and mild cervical tenderness with Q-tip. Bimanual exam not performed.    Assessment:   Postcoital bleeding STD screening Increased BMI  Plan:   1. Postcoital bleeding -will screen for vaginitis based on patient's history of symptoms. Also desires to have STD screening performed.  Nuswab collected. Patient also had questions as to whether she was using her birth  control correctly. Currently leaving ring in for 4 weeks and taking out on 5th week.  Advised that she should only leave in for 3 weeks and remove on fourth, or could leave it in for a total of 4 weeks and change it if she desires to use in a continuous method.  2. Increased BMI - patient with moderate obesity, noting difficulties with weight loss. Review of chart notes small weight change (8 lbs in the past year), however patient with body image issues at this time. Has been dieting and exercising for 3 months without success.  Can return to have further discussion of weight management options at a separate appointment.    A total of 15 minutes were spent face-to-face with the patient during this encounter and over half of that time dealt with counseling and coordination of care.   13/11/20, MD [facility]

## 2019-11-03 NOTE — Progress Notes (Signed)
Pt present for possible STD/BV symptoms. Pt stated that she is having BV symptoms x 1 month.

## 2019-11-04 ENCOUNTER — Encounter: Payer: Self-pay | Admitting: Obstetrics and Gynecology

## 2019-11-05 LAB — CERVICOVAGINAL ANCILLARY ONLY
Bacterial Vaginitis (gardnerella): NEGATIVE
Candida Glabrata: NEGATIVE
Candida Vaginitis: POSITIVE — AB
Chlamydia: NEGATIVE
Comment: NEGATIVE
Comment: NEGATIVE
Comment: NEGATIVE
Comment: NEGATIVE
Comment: NEGATIVE
Comment: NORMAL
Neisseria Gonorrhea: NEGATIVE
Trichomonas: NEGATIVE

## 2019-11-05 MED ORDER — FLUCONAZOLE 150 MG PO TABS: 150.0000 mg | ORAL_TABLET | Freq: Once | ORAL | 1 refills | Status: AC

## 2019-11-05 NOTE — Addendum Note (Signed)
Addended by: Fabian November on: 11/05/2019 05:35 PM   Modules accepted: Orders

## 2019-11-10 ENCOUNTER — Encounter: Payer: 59 | Admitting: Obstetrics and Gynecology

## 2019-12-22 ENCOUNTER — Other Ambulatory Visit: Payer: Self-pay | Admitting: Obstetrics and Gynecology

## 2019-12-22 NOTE — Telephone Encounter (Signed)
Patient needs annual exam. Given 3 month refill. No further refills until seen.

## 2019-12-30 NOTE — Telephone Encounter (Signed)
Spoke to pt concerning her medication refill and she needed to schedule an annual exam with [facility].  Pt was scheduled an appointment greater than 2 months due to no openings.

## 2020-03-14 NOTE — Patient Instructions (Incomplete)

## 2020-03-15 ENCOUNTER — Ambulatory Visit (INDEPENDENT_AMBULATORY_CARE_PROVIDER_SITE_OTHER): Payer: 59 | Admitting: Obstetrics and Gynecology

## 2020-03-15 ENCOUNTER — Other Ambulatory Visit: Payer: Self-pay

## 2020-03-15 ENCOUNTER — Encounter: Payer: Self-pay | Admitting: Obstetrics and Gynecology

## 2020-03-15 VITALS — BP 102/76 | HR 80 | Ht 68.0 in | Wt 237.9 lb

## 2020-03-15 DIAGNOSIS — E669 Obesity, unspecified: Secondary | ICD-10-CM

## 2020-03-15 DIAGNOSIS — F439 Reaction to severe stress, unspecified: Secondary | ICD-10-CM | POA: Diagnosis not present

## 2020-03-15 DIAGNOSIS — Z01419 Encounter for gynecological examination (general) (routine) without abnormal findings: Secondary | ICD-10-CM

## 2020-03-15 MED ORDER — NUVARING 0.12-0.015 MG/24HR VA RING: VAGINAL_RING | VAGINAL | 3 refills | Status: DC

## 2020-03-15 NOTE — Progress Notes (Signed)
GYNECOLOGY ANNUAL PHYSICAL EXAM PROGRESS NOTE  Subjective:    Rose [name] is a 30 y.o. G64P2002 female who presents for an annual exam.  The patient is sexually active.  The patient wears seatbelts: yes. The patient participates in regular exercise: no. Has the patient ever been transfused or tattooed?: no. The patient reports that there is not domestic violence in her life.   The patient has the following complaints today: 1. Does admit to some stress at home. Notes that she recently got divorced in January of this year, and has full custody of her youngest son.  Also has her older child living with her as well. Is trying to adjust to a new norm.  States that she does not have a very good or robust support system.   Menstrual History: Menarche age: 59 Patient's last menstrual period was 02/16/2020. Period Duration (Days): 7 Period Pattern: Regular Menstrual Flow: Heavy Menstrual Control: Tampon Menstrual Control Change Freq (Hours): 2-3 Dysmenorrhea: None    Gynecologic History  Menarche age: 59 Patient's last menstrual period was 02/16/2020. Contraception: NuvaRing vaginal inserts  History of STI's: History of chlamydia (2013) Last Pap: 11/18/2018. Results were: normal.  Denies h/o abnormal pap smears.    Upstream - 03/15/20 1115      Pregnancy Intention Screening   Does the patient want to become pregnant in the next year? No    Does the patient's partner want to become pregnant in the next year? No    Would the patient like to discuss contraceptive options today? No      Contraception Wrap Up   Current Method Vaginal Ring    End Method Vaginal Ring    Contraception Counseling Provided No          The pregnancy intention screening data noted above was reviewed. Potential methods of contraception were not discussed. The patient elected to continue with Vaginal Ring.   OB History  Gravida Para Term Preterm AB Living  2 2 2  0 0 2  SAB IAB Ectopic Multiple  Live Births  0 0 0 0 2    # Outcome Date GA Lbr Len/2nd Weight Sex Delivery Anes PTL Lv  2 Term 11/14/15 [redacted]w[redacted]d / 00:12 8 lb 10.3 oz (3.92 kg) M Vag-Spont EPI  LIV     Name: [name],[name]     Apgar1: 9  Apgar5: 9  1 Term 2011 [redacted]w[redacted]d   M Vag-Spont None N LIV    Obstetric Comments  Postpartum hemorrhage after 1st delivery, did not require blood transfusion.     Past Medical History:  Diagnosis Date  . Asthma   . History of postpartum hemorrhage   . Hx of chlamydia infection    +12/22/11, TOC neg   . IBD (inflammatory bowel disease)    Chron's disease  . 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   . Personal history UTI     Past Surgical History:  Procedure Laterality Date  . APPENDECTOMY  2013   acute appendicitis  . EYE SURGERY    . LAPAROSCOPIC APPENDECTOMY  02/03/2012   Procedure: APPENDECTOMY LAPAROSCOPIC;  Surgeon: 02/05/2012. Wilmon Arms, MD;  Location: [facility] OR;  Service: General;  Laterality: N/A;  . lymph node biopsy     Left neck after cat scratch fever  . TYMPANOSTOMY TUBE PLACEMENT      Family History  Problem Relation Age of Onset  . COPD Mother   . Asthma  Mother   . Lung cancer Maternal Grandmother     Social History   Socioeconomic History  . Marital status: Single    Spouse name: Not on file  . Number of children: 1  . Years of education: 45  . Highest education level: Not on file  Occupational History  . Occupation: Conservation officer, nature at [employer]  . Smoking status: Current Some Day Smoker    Types: Cigarettes    Last attempt to quit: 06/08/2015    Years since quitting: 4.7  . Smokeless tobacco: Never Used  . Tobacco comment: One pack per week.  Vaping Use  . Vaping Use: Some days  Substance and Sexual Activity  . Alcohol use: Yes    Alcohol/week: 0.0 standard drinks    Comment: Occasion use - socially  . Drug use: Not Currently    Types: Marijuana  . Sexual activity: Yes    Birth  control/protection: Implant  Other Topics Concern  . Not on file  Social History Narrative   Lives at home with her son.   Right-handed.   6-8 cups caffeine per day.   Social Determinants of Health   Financial Resource Strain: Not on file  Food Insecurity: Not on file  Transportation Needs: Not on file  Physical Activity: Not on file  Stress: Not on file  Social Connections: Not on file  Intimate Partner Violence: Not on file    Current Outpatient Medications on File Prior to Visit  Medication Sig Dispense Refill  . acetaminophen (TYLENOL) 500 MG tablet Take 500 mg by mouth every 6 (six) hours as needed.    Marland Kitchen ibuprofen (ADVIL,MOTRIN) 200 MG tablet Take 200 mg by mouth every 6 (six) hours as needed.    Marland Kitchen NUVARING 0.12-0.015 MG/24HR vaginal ring INSERT ONE VAGINALLY AND LEAVE IN PLACE FOR THREE CONSECUTIVE WEEKS. THEN REMOVE FOR ONE WEEK 1 each 2   No current facility-administered medications on file prior to visit.    Allergies  Allergen Reactions  . Penicillins Hives    Has patient had a PCN reaction causing immediate rash, facial/tongue/throat swelling, SOB or lightheadedness with hypotension: Yes Has patient had a PCN reaction causing severe rash involving mucus membranes or skin necrosis: No Has patient had a PCN reaction that required hospitalization Yes Has patient had a PCN reaction occurring within the last 10 years: No If all of the above answers are "NO", then may proceed with Cephalosporin use.      Review of Systems Constitutional: negative for chills, fatigue, fevers and sweats Eyes: negative for irritation, redness and visual disturbance Ears, nose, mouth, throat, and face: negative for hearing loss, nasal congestion, snoring and tinnitus Respiratory: negative for asthma, cough, sputum Cardiovascular: negative for chest pain, dyspnea, exertional chest pressure/discomfort, irregular heart beat, palpitations and syncope Gastrointestinal: negative for abdominal  pain, change in bowel habits, nausea and vomiting Genitourinary: negative for abnormal menstrual periods, genital lesions, sexual problems and vaginal discharge, dysuria and urinary incontinence Integument/breast: negative for breast lump, breast tenderness and nipple discharge Hematologic/lymphatic: negative for bleeding and easy bruising Musculoskeletal:negative for back pain and muscle weakness Neurological:   Negative for headaches, dizziness, vertigo and weakness Endocrine: negative for diabetic symptoms including polydipsia, polyuria and skin dryness Allergic/Immunologic: negative for hay fever and urticaria       Objective:  Blood pressure 102/76, pulse 80, height 5\' 8"  (1.727 m), weight 237 lb 14.4 oz (107.9 kg), last menstrual period 02/16/2020. Body mass index is 36.17 kg/m.  General Appearance:  Alert, cooperative, no distress, appears stated age, moderate obesity  Head:    Normocephalic, without obvious abnormality, atraumatic  Eyes:    PERRL, conjunctiva/corneas clear, EOM's intact, both eyes  Ears:    Normal external ear canals, both ears  Nose:   Nares normal, septum midline, mucosa normal, no drainage or sinus tenderness  Throat:   Lips, mucosa, and tongue normal; teeth and gums normal  Neck:   Supple, symmetrical, trachea midline, no adenopathy; thyroid: no enlargement/tenderness/nodules; no carotid bruit or JVD  Back:     Symmetric, no curvature, ROM normal, no CVA tenderness  Lungs:     Clear to auscultation bilaterally, respirations unlabored  Chest Wall:    No tenderness or deformity   Heart:    Regular rate and rhythm, S1 and S2 normal, no murmur, rub or gallop  Breast Exam:    No tenderness, masses, or nipple abnormality  Abdomen:     Soft, non-tender, bowel sounds active all four quadrants, no masses, no organomegaly.    Genitalia:    Pelvic:external genitalia normal, vagina without lesions, discharge, or tenderness, rectovaginal septum  normal. Cervix normal in  appearance, no cervical motion tenderness, no adnexal masses or tenderness.  Uterus normal size, shape, mobile, regular contours, nontender.  Rectal:    Normal external sphincter.  No hemorrhoids appreciated. Internal exam not done.   Extremities:   Extremities normal, atraumatic, no cyanosis or edema  Pulses:   2+ and symmetric all extremities  Skin:   Skin color, texture, turgor normal, no rashes or lesions  Lymph nodes:   Cervical, supraclavicular, and axillary nodes normal  Neurologic:   CNII-XII intact, normal strength, sensation and reflexes throughout    Labs:  Lab Results  Component Value Date   WBC 7.5 06/05/2017   HGB 15.3 06/05/2017   HCT 44.5 06/05/2017   MCV 91 06/05/2017   PLT 240 06/05/2017    Lab Results  Component Value Date   CREATININE 0.72 06/05/2017   BUN 9 06/05/2017   NA 138 06/05/2017   K 4.3 06/05/2017   CL 106 06/05/2017   CO2 18 (L) 06/05/2017    Lab Results  Component Value Date   ALT 18 06/05/2017   AST 15 06/05/2017   ALKPHOS 61 06/05/2017   BILITOT 0.4 06/05/2017    Lab Results  Component Value Date   TSH 1.430 04/03/2015     Assessment:    Healthy female exam.  Nexplanon in place Mild obesity STD screening  Plan:    - Blood tests: see orders - Breast self exam technique reviewed and patient encouraged to perform self-exam monthly. - Contraception: NuvaRing. Needs refill.  - Discussed healthy lifestyle modifications.  Patient notes that she would like to lose weight (at least 40 lbs), however is difficult right now as she is adjusting to single mom. Advised on methods that can help with time management, meal prepping, including kids with exercises, climbing stairs more at work, parking further away at stores, starting small with 10-15 min workouts.  - Pap smear up to date.  Due next year.  - Discussed stress management, advised on warning signs of when stress leads to depression.  - COVID vaccination status: declined.  - Flu  vaccine: completed in October.  - Follow up in 1 year for annual exam, as needed.    Hildred Laser, MD [facility]

## 2020-03-15 NOTE — Progress Notes (Signed)
Pt present for annual exam. Pt stated that she was doing well.  

## 2020-03-16 LAB — COMPREHENSIVE METABOLIC PANEL
ALT: 20 IU/L (ref 0–32)
AST: 18 IU/L (ref 0–40)
Albumin/Globulin Ratio: 1.9 (ref 1.2–2.2)
Albumin: 4.5 g/dL (ref 3.9–5.0)
Alkaline Phosphatase: 58 IU/L (ref 44–121)
BUN/Creatinine Ratio: 10 (ref 9–23)
BUN: 8 mg/dL (ref 6–20)
Bilirubin Total: 0.3 mg/dL (ref 0.0–1.2)
CO2: 19 mmol/L — ABNORMAL LOW (ref 20–29)
Calcium: 9.3 mg/dL (ref 8.7–10.2)
Chloride: 104 mmol/L (ref 96–106)
Creatinine, Ser: 0.81 mg/dL (ref 0.57–1.00)
Globulin, Total: 2.4 g/dL (ref 1.5–4.5)
Glucose: 82 mg/dL (ref 65–99)
Potassium: 4.4 mmol/L (ref 3.5–5.2)
Sodium: 139 mmol/L (ref 134–144)
Total Protein: 6.9 g/dL (ref 6.0–8.5)
eGFR: 101 mL/min/{1.73_m2} (ref >59)

## 2020-03-16 LAB — LIPID PANEL
Chol/HDL Ratio: 3.6 ratio (ref 0.0–4.4)
Cholesterol, Total: 167 mg/dL (ref 100–199)
HDL: 46 mg/dL (ref >39)
LDL Chol Calc (NIH): 71 mg/dL (ref 0–99)
Triglycerides: 313 mg/dL — ABNORMAL HIGH (ref 0–149)
VLDL Cholesterol Cal: 50 mg/dL — ABNORMAL HIGH (ref 5–40)

## 2020-03-16 LAB — CBC
Hematocrit: 42.4 % (ref 34.0–46.6)
Hemoglobin: 14.5 g/dL (ref 11.1–15.9)
MCH: 31.2 pg (ref 26.6–33.0)
MCHC: 34.2 g/dL (ref 31.5–35.7)
MCV: 91 fL (ref 79–97)
Platelets: 247 10*3/uL (ref 150–450)
RBC: 4.65 x10E6/uL (ref 3.77–5.28)
RDW: 12.8 % (ref 11.7–15.4)
WBC: 6.6 10*3/uL (ref 3.4–10.8)

## 2020-08-18 ENCOUNTER — Telehealth: Payer: 59 | Admitting: Obstetrics and Gynecology

## 2020-08-18 MED ORDER — NUVARING 0.12-0.015 MG/24HR VA RING: VAGINAL_RING | VAGINAL | 3 refills | Status: DC

## 2020-08-18 NOTE — Telephone Encounter (Signed)
Spoke to pt. Pt stated that her insurance would not cover the medication if it was sent to [facility] but RX needed to go to [facility]. Medication sent to mail order pharmacy.

## 2020-08-18 NOTE — Telephone Encounter (Signed)
Patient states that she got a letter from her insurance company that they will no longer cover her Nuvaring...please advise

## 2021-02-07 DIAGNOSIS — L700 Acne vulgaris: Secondary | ICD-10-CM | POA: Diagnosis not present

## 2021-02-26 DIAGNOSIS — Z0289 Encounter for other administrative examinations: Secondary | ICD-10-CM

## 2021-03-20 ENCOUNTER — Encounter (INDEPENDENT_AMBULATORY_CARE_PROVIDER_SITE_OTHER): Payer: Self-pay | Admitting: Family Medicine

## 2021-03-20 ENCOUNTER — Ambulatory Visit (INDEPENDENT_AMBULATORY_CARE_PROVIDER_SITE_OTHER): Payer: BC Managed Care – PPO | Admitting: Family Medicine

## 2021-03-20 ENCOUNTER — Other Ambulatory Visit: Payer: Self-pay

## 2021-03-20 VITALS — BP 120/77 | HR 66 | Temp 98.2°F | Ht 67.0 in | Wt 241.0 lb

## 2021-03-20 DIAGNOSIS — G43009 Migraine without aura, not intractable, without status migrainosus: Secondary | ICD-10-CM

## 2021-03-20 DIAGNOSIS — Z1331 Encounter for screening for depression: Secondary | ICD-10-CM

## 2021-03-20 DIAGNOSIS — Z9189 Other specified personal risk factors, not elsewhere classified: Secondary | ICD-10-CM

## 2021-03-20 DIAGNOSIS — R0602 Shortness of breath: Secondary | ICD-10-CM

## 2021-03-20 DIAGNOSIS — F5089 Other specified eating disorder: Secondary | ICD-10-CM

## 2021-03-20 DIAGNOSIS — R5383 Other fatigue: Secondary | ICD-10-CM

## 2021-03-20 DIAGNOSIS — E559 Vitamin D deficiency, unspecified: Secondary | ICD-10-CM

## 2021-03-20 DIAGNOSIS — E669 Obesity, unspecified: Secondary | ICD-10-CM

## 2021-03-20 DIAGNOSIS — Z6837 Body mass index (BMI) 37.0-37.9, adult: Secondary | ICD-10-CM

## 2021-03-21 LAB — CBC WITH DIFFERENTIAL/PLATELET
Basophils Absolute: 0 10*3/uL (ref 0.0–0.2)
Basos: 0 %
EOS (ABSOLUTE): 0.1 10*3/uL (ref 0.0–0.4)
Eos: 1 %
Hematocrit: 42.3 % (ref 34.0–46.6)
Hemoglobin: 15 g/dL (ref 11.1–15.9)
Immature Grans (Abs): 0 10*3/uL (ref 0.0–0.1)
Immature Granulocytes: 0 %
Lymphocytes Absolute: 1.6 10*3/uL (ref 0.7–3.1)
Lymphs: 20 %
MCH: 31.7 pg (ref 26.6–33.0)
MCHC: 35.5 g/dL (ref 31.5–35.7)
MCV: 89 fL (ref 79–97)
Monocytes Absolute: 0.4 10*3/uL (ref 0.1–0.9)
Monocytes: 5 %
Neutrophils Absolute: 5.9 10*3/uL (ref 1.4–7.0)
Neutrophils: 74 %
Platelets: 252 10*3/uL (ref 150–450)
RBC: 4.73 x10E6/uL (ref 3.77–5.28)
RDW: 12.3 % (ref 11.7–15.4)
WBC: 8.1 10*3/uL (ref 3.4–10.8)

## 2021-03-21 LAB — COMPREHENSIVE METABOLIC PANEL
ALT: 30 IU/L (ref 0–32)
AST: 37 IU/L (ref 0–40)
Albumin/Globulin Ratio: 1.7 (ref 1.2–2.2)
Albumin: 4.6 g/dL (ref 3.9–5.0)
Alkaline Phosphatase: 61 IU/L (ref 44–121)
BUN/Creatinine Ratio: 13 (ref 9–23)
BUN: 10 mg/dL (ref 6–20)
Bilirubin Total: 0.4 mg/dL (ref 0.0–1.2)
CO2: 20 mmol/L (ref 20–29)
Calcium: 9.6 mg/dL (ref 8.7–10.2)
Chloride: 103 mmol/L (ref 96–106)
Creatinine, Ser: 0.79 mg/dL (ref 0.57–1.00)
Globulin, Total: 2.7 g/dL (ref 1.5–4.5)
Glucose: 85 mg/dL (ref 70–99)
Potassium: 4.3 mmol/L (ref 3.5–5.2)
Sodium: 141 mmol/L (ref 134–144)
Total Protein: 7.3 g/dL (ref 6.0–8.5)
eGFR: 104 mL/min/{1.73_m2} (ref >59)

## 2021-03-21 LAB — LIPID PANEL WITH LDL/HDL RATIO
Cholesterol, Total: 170 mg/dL (ref 100–199)
HDL: 47 mg/dL (ref >39)
LDL Chol Calc (NIH): 82 mg/dL (ref 0–99)
LDL/HDL Ratio: 1.7 ratio (ref 0.0–3.2)
Triglycerides: 246 mg/dL — ABNORMAL HIGH (ref 0–149)
VLDL Cholesterol Cal: 41 mg/dL — ABNORMAL HIGH (ref 5–40)

## 2021-03-21 LAB — HEMOGLOBIN A1C
Est. average glucose Bld gHb Est-mCnc: 103 mg/dL
Hgb A1c MFr Bld: 5.2 % (ref 4.8–5.6)

## 2021-03-21 LAB — T3: T3, Total: 163 ng/dL (ref 71–180)

## 2021-03-21 LAB — INSULIN, RANDOM: INSULIN: 18.2 u[IU]/mL (ref 2.6–24.9)

## 2021-03-21 LAB — TSH: TSH: 1.46 u[IU]/mL (ref 0.450–4.500)

## 2021-03-21 LAB — FOLATE: Folate: 8.6 ng/mL (ref >3.0)

## 2021-03-21 LAB — T4, FREE: Free T4: 1.11 ng/dL (ref 0.82–1.77)

## 2021-03-21 LAB — VITAMIN D 25 HYDROXY (VIT D DEFICIENCY, FRACTURES): Vit D, 25-Hydroxy: 26.1 ng/mL — ABNORMAL LOW (ref 30.0–100.0)

## 2021-03-21 LAB — VITAMIN B12: Vitamin B-12: 216 pg/mL — ABNORMAL LOW (ref 232–1245)

## 2021-03-22 NOTE — Progress Notes (Signed)
? ? ? ? ?Chief Complaint:  ? ?OBESITY ?Rose [name] (MR# [id number]) is a 31 y.o. female who presents for evaluation and treatment of obesity and related comorbidities. Current BMI is Body mass index is 37.75 kg/m?Marland Kitchen Rose [name] has been struggling with her weight for many years and has been unsuccessful in either losing weight, maintaining weight loss, or reaching her healthy weight goal. ? ?Rose [name] was referred by Rose Balls, NP. 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 Skips breakfast daily. 24 oz water in AM. Coffee with creamer (2 tablespoon) or large vanilla ice coffee with classic syrup cream and whipped cream. Lunch-2-3 chicken tenders, 1 cup of broccoli, 1 cup of rice (full). Snack-pretzels or candy (sour patch kids) 1 handful (feels hungry). 730-8 pm, Dinner-meat (BBQ chicken thighs 2-3), 2 cups of veggies, 3/4 cup of mashed potato or mac and chesses. After dinner-cravings sweets, Little Debbies cakes. ? ?Rose [name] is currently in the action stage of change and ready to dedicate time achieving and maintaining a healthier weight. Rose [name] is interested in becoming our patient and working on intensive lifestyle modifications including (but not limited to) diet and exercise for weight loss. ? ?Rose [name]'s habits were reviewed today and are as follows: Her family eats meals together, she thinks her family will eat healthier with her, her desired weight loss is 61 lbs, she has been heavy most of her life, she started gaining weight 2 years ago, her heaviest weight ever was 260 pounds, she has significant food cravings issues, she snacks frequently in the evenings, she skips meals frequently, she is frequently drinking liquids with calories, she frequently makes poor food choices, she has problems with excessive hunger, she frequently eats larger portions than normal, and she struggles with emotional eating. ? ?Depression Screen ?Rose [name]'s Food and Mood (modified PHQ-9) score was  16. ? ?Depression screen [facility] 2/9 03/20/2021  ?Decreased Interest 1  ?Down, Depressed, Hopeless 2  ?PHQ - 2 Score 3  ?Altered sleeping 0  ?Tired, decreased energy 2  ?Change in appetite 2  ?Feeling bad or failure about yourself  2  ?Trouble concentrating 3  ?Moving slowly or fidgety/restless 1  ?Suicidal thoughts 3  ?PHQ-9 Score 16  ?Difficult doing work/chores Very difficult  ? ?Subjective:  ? ?1. Other fatigue ?Rose [name] admits to daytime somnolence and denies waking up still tired. Patient has a history of symptoms of daytime fatigue and morning headache. Rose [name] generally gets 6 or 8 hours of sleep per night, and states that she has generally restful sleep. Snoring is not present. Apneic episodes are not present. Epworth Sleepiness Score is 4. EKG-normal sinus rhythm at 72 BPM. ? ?2. SOBOE (shortness of breath on exertion) ?Rose [name] notes increasing shortness of breath with exercising and seems to be worsening over time with weight gain. She notes getting out of breath sooner with activity than she used to. This has not gotten worse recently. Rose [name] denies shortness of breath at rest or orthopnea. ? ?3. Migraine without aura and without status migrainosus, not intractable ?Rose [name] notes a few episode in the last 3 months. She is on Imitrex. ? ?4. Vitamin D deficiency ?Rose [name]'s diagnosis is likely given obesity. ? ?5. Other specified eating disorder ?Rose [name] denies suicidal or homicidal ideations. She hasn't ben consistent with therapy in the past.  ? ?6. At risk for depression ?Rose [name] is at elevated risk of depression due to one or more of the following: family history, significant life stressors, medical conditions and/or poor nutrition. ? ?Assessment/Plan:  ? ?  1. Other fatigue ?Rose [name] does feel that her weight is causing her energy to be lower than it should be. Fatigue may be related to obesity, depression or many other causes. Labs will be ordered, and in the meanwhile, Rose [name] will focus on self  care including making healthy food choices, increasing physical activity and focusing on stress reduction. ? ?- EKG 12-Lead ?- Vitamin B12 ?- Comprehensive metabolic panel ?- Folate ?- Hemoglobin A1c ?- Insulin, random ?- T3 ?- T4, free ?- TSH ?- CBC with Differential/Platelet ?- Lipid Panel With LDL/HDL Ratio ? ?2. SOBOE (shortness of breath on exertion) ?Rose [name] does feel that she gets out of breath more easily that she used to when she exercises. Rose [name]'s shortness of breath appears to be obesity related and exercise induced. She has agreed to work on weight loss and gradually increase exercise to treat her exercise induced shortness of breath. Will continue to monitor closely. ? ?3. Migraine without aura and without status migrainosus, not intractable ?Rose [name] is to follow up on the frequency at her subsequent appointment.  ? ?4. Vitamin D deficiency ?We will check labs today. Rose [name] will follow-up for routine testing of Vitamin D, at least 2-3 times per year to avoid over-replacement. ? ?- VITAMIN D 25 Hydroxy (Vit-D Deficiency, Fractures) ? ?5. Other specified eating disorder ?We will refer Rose [name] to Rose [name] for psychotherapy. ? ?- Ambulatory referral to Psychiatry ? ?6. Depression screening ?Rose [name] had a positive depression screening. Depression is commonly associated with obesity and often results in emotional eating behaviors. We will monitor this closely and work on CBT to help improve the non-hunger eating patterns. Referral to Psychology may be required if no improvement is seen as she continues in our clinic. ? ?7. At risk for depression ?Rose [name] was given approximately 15 minutes of depression risk counseling today. She has risk factors for depression including stress. We discussed the importance of a healthy work life balance, a healthy relationship with food and a good support system. ? ?Repetitive spaced learning was employed today to elicit superior memory formation and behavioral  change. ? ?8. Obesity with current BMI of 37.9 ?Rose [name] is currently in the action stage of change and her goal is to continue with weight loss efforts. I recommend Rose [name] begin the structured treatment plan as follows: ? ?She has agreed to the Category 3 Plan. ? ?Exercise goals: No exercise has been prescribed at this time.  ? ?Behavioral modification strategies: increasing lean protein intake, no skipping meals, meal planning and cooking strategies, and keeping healthy foods in the home. ? ?She was informed of the importance of frequent follow-up visits to maximize her success with intensive lifestyle modifications for her multiple health conditions. She was informed we would discuss her lab results at her next visit unless there is a critical issue that needs to be addressed sooner. Rose [name] agreed to keep her next visit at the agreed upon time to discuss these results. ? ?Objective:  ? ?Blood pressure 120/77, pulse 66, temperature 98.2 ?F (36.8 ?C), height _0  (1.702 m), weight 241 lb (109.3 kg), last menstrual period 03/05/2021, SpO2 96 %. Body mass index is 37.75 kg/m?. ? ?EKG: Normal sinus rhythm, rate 72 BPM. ? ?Indirect Calorimeter completed today shows a VO2 of 302 and a REE of 2088.  Her calculated basal metabolic rate is 2025 thus her basal metabolic rate is better than expected. ? ?General: Cooperative, alert, well developed, in no acute distress. ?HEENT: Conjunctivae and lids unremarkable. ?Cardiovascular: Regular rhythm.  ?Lungs:  Normal work of breathing. ?Neurologic: No focal deficits.  ? ?Lab Results  ?Component Value Date  ? CREATININE 0.79 03/20/2021  ? BUN 10 03/20/2021  ? NA 141 03/20/2021  ? K 4.3 03/20/2021  ? CL 103 03/20/2021  ? CO2 20 03/20/2021  ? ?Lab Results  ?Component Value Date  ? ALT 30 03/20/2021  ? AST 37 03/20/2021  ? ALKPHOS 61 03/20/2021  ? BILITOT 0.4 03/20/2021  ? ?Lab Results  ?Component Value Date  ? HGBA1C 5.2 03/20/2021  ? HGBA1C 5.1 04/03/2015  ? ?Lab Results   ?Component Value Date  ? INSULIN 18.2 03/20/2021  ? ?Lab Results  ?Component Value Date  ? TSH 1.460 03/20/2021  ? ?Lab Results  ?Component Value Date  ? CHOL 170 03/20/2021  ? HDL 47 03/20/2021  ? Echelon 82 03/20/2021  ? TR

## 2021-04-03 ENCOUNTER — Encounter (INDEPENDENT_AMBULATORY_CARE_PROVIDER_SITE_OTHER): Payer: Self-pay | Admitting: Family Medicine

## 2021-04-03 ENCOUNTER — Other Ambulatory Visit: Payer: Self-pay

## 2021-04-03 ENCOUNTER — Ambulatory Visit (INDEPENDENT_AMBULATORY_CARE_PROVIDER_SITE_OTHER): Payer: BC Managed Care – PPO | Admitting: Family Medicine

## 2021-04-03 VITALS — BP 109/68 | HR 65 | Temp 98.2°F | Ht 67.0 in | Wt 241.0 lb

## 2021-04-03 DIAGNOSIS — E669 Obesity, unspecified: Secondary | ICD-10-CM | POA: Diagnosis not present

## 2021-04-03 DIAGNOSIS — E8881 Metabolic syndrome: Secondary | ICD-10-CM

## 2021-04-03 DIAGNOSIS — E538 Deficiency of other specified B group vitamins: Secondary | ICD-10-CM

## 2021-04-03 DIAGNOSIS — E559 Vitamin D deficiency, unspecified: Secondary | ICD-10-CM | POA: Diagnosis not present

## 2021-04-03 DIAGNOSIS — Z6837 Body mass index (BMI) 37.0-37.9, adult: Secondary | ICD-10-CM

## 2021-04-03 MED ORDER — VITAMIN D (ERGOCALCIFEROL) 1.25 MG (50000 UNIT) PO CAPS: 50000.0000 [IU] | ORAL_CAPSULE | ORAL | 0 refills | Status: DC

## 2021-04-04 NOTE — Progress Notes (Signed)
? ? ? ?Chief Complaint:  ? ?OBESITY ?Rose [name] is here to discuss her progress with her obesity treatment plan along with follow-up of her obesity related diagnoses. [name] is on the Category 3 Plan and states she is following her eating plan approximately 60% of the time. [name] states she is walking 30 minutes 5 times per week. ? ?Today's visit was #: 2 ?Starting weight: 241 lbs ?Starting date: 03/20/2021 ?Today's weight: 241 lbs ?Today's date: 04/03/2021 ?Total lbs lost to date: 0 ?Total lbs lost since last in-office visit: 0 ? ?Interim History: [name] felt the meal plan was fine to follow. She does not need to change food options. [name] did not weigh her meat, but did measure some food. She is trying to eat more protein daily and made [place] yogurt ranch. [name] made the choice for sugar free Red Bull and stopped drinking sodas. She is doing creamer in her coffee. ? ?Subjective:  ? ?1. Vitamin B12 deficiency ?[name]'s B12 level is 216. Her MCV was within normal limits and she is not on any medications to alter absorption. ? ?2. Vitamin D deficiency ?She is currently taking no vitamin D supplement. She notes fatigue and denies nausea, vomiting or muscle weakness. ? ?Lab Results  ?Component Value Date  ? VD25OH 26.1 (L) 03/20/2021  ? ?3. Insulin resistance ?[name]'s HgbA1c was 5.2 and her fasting insulin was 18.2. She does have some drive for carbs. ? ?Lab Results  ?Component Value Date  ? HGBA1C 5.2 03/20/2021  ? ?Lab Results  ?Component Value Date  ? INSULIN 18.2 03/20/2021  ? ?Assessment/Plan:  ? ?1. Vitamin B12 deficiency ?[name] agrees to start taking Prenatal vitamins and will follow up at the agreed upon time. ? ?2. Vitamin D deficiency ?Low Vitamin D level contributes to fatigue and are associated with obesity, breast, and colon cancer. She agrees to start to take prescription Vitamin D @50 ,000 IU every week and will follow-up for routine testing of Vitamin D, at least 2-3 times per year to  avoid over-replacement. ? ?- Vitamin D, Ergocalciferol, (DRISDOL) 1.25 MG (50000 UNIT) CAPS capsule; Take 1 capsule (50,000 Units total) by mouth every 7 (seven) days.  Dispense: 4 capsule; Refill: 0 ? ?3. Insulin resistance ?We will repeat labs in 3 months with no medication at this time. If [name]'s hunger increases or cravings increase, she may consider GLP-1. [name] will continue to work on weight loss, exercise, and decreasing simple carbohydrates to help decrease the risk of diabetes. [name] agreed to follow-up with [name] as directed to closely monitor her progress. ? ?4. Obesity with current BMI of 37.8 ?[name] is currently in the action stage of change. As such, her goal is to continue with weight loss efforts. She has agreed to the Category 3 Plan.  ? ?Exercise goals: All adults should avoid inactivity. Some physical activity is better than none, and adults who participate in any amount of physical activity gain some health benefits. ? ?Behavioral modification strategies: increasing lean protein intake, meal planning and cooking strategies, keeping healthy foods in the home, and planning for success. ? ?[name] has agreed to follow-up with our clinic in 2 weeks. She was informed of the importance of frequent follow-up visits to maximize her success with intensive lifestyle modifications for her multiple health conditions.  ? ?Objective:  ? ?Blood pressure 109/68, pulse 65, temperature 98.2 ?F (36.8 ?C), height 5\' 7"  (1.702 m), weight 241 lb (109.3 kg), last menstrual period 03/05/2021, SpO2 97 %. ?Body mass index is 37.75 kg/m? ? ?  General: Cooperative, alert, well developed, in no acute distress. ?HEENT: Conjunctivae and lids unremarkable. ?Cardiovascular: Regular rhythm.  ?Lungs: Normal work of breathing. ?Neurologic: No focal deficits.  ? ?Lab Results  ?Component Value Date  ? CREATININE 0.79 03/20/2021  ? BUN 10 03/20/2021  ? NA 141 03/20/2021  ? K 4.3 03/20/2021  ? CL 103 03/20/2021  ? CO2 20  03/20/2021  ? ?Lab Results  ?Component Value Date  ? ALT 30 03/20/2021  ? AST 37 03/20/2021  ? ALKPHOS 61 03/20/2021  ? BILITOT 0.4 03/20/2021  ? ?Lab Results  ?Component Value Date  ? HGBA1C 5.2 03/20/2021  ? HGBA1C 5.1 04/03/2015  ? ?Lab Results  ?Component Value Date  ? INSULIN 18.2 03/20/2021  ? ?Lab Results  ?Component Value Date  ? TSH 1.460 03/20/2021  ? ?Lab Results  ?Component Value Date  ? CHOL 170 03/20/2021  ? HDL 47 03/20/2021  ? LDLCALC 82 03/20/2021  ? TRIG 246 (H) 03/20/2021  ? CHOLHDL 3.6 03/15/2020  ? ?Lab Results  ?Component Value Date  ? VD25OH 26.1 (L) 03/20/2021  ? ?Lab Results  ?Component Value Date  ? WBC 8.1 03/20/2021  ? HGB 15.0 03/20/2021  ? HCT 42.3 03/20/2021  ? MCV 89 03/20/2021  ? PLT 252 03/20/2021  ? ?No results found for: IRON, TIBC, FERRITIN ? ?Attestation Statements:  ? ?Reviewed by clinician on day of visit: allergies, medications, problem list, medical history, surgical history, family history, social history, and previous encounter notes. ? ?I, Kirke Corin, CMA, am acting as transcriptionist for Reuben Likes, MD ? ?I have reviewed the above documentation for accuracy and completeness, and I agree with the above. Reuben Likes, MD ? ?

## 2021-04-19 ENCOUNTER — Telehealth: Payer: Self-pay | Admitting: Obstetrics and Gynecology

## 2021-04-19 ENCOUNTER — Encounter (INDEPENDENT_AMBULATORY_CARE_PROVIDER_SITE_OTHER): Payer: Self-pay | Admitting: Family Medicine

## 2021-04-19 ENCOUNTER — Ambulatory Visit (INDEPENDENT_AMBULATORY_CARE_PROVIDER_SITE_OTHER): Payer: BC Managed Care – PPO | Admitting: Family Medicine

## 2021-04-19 VITALS — BP 98/64 | HR 77 | Temp 98.0°F | Ht 67.0 in | Wt 237.0 lb

## 2021-04-19 DIAGNOSIS — E781 Pure hyperglyceridemia: Secondary | ICD-10-CM | POA: Diagnosis not present

## 2021-04-19 DIAGNOSIS — E669 Obesity, unspecified: Secondary | ICD-10-CM

## 2021-04-19 DIAGNOSIS — Z6837 Body mass index (BMI) 37.0-37.9, adult: Secondary | ICD-10-CM

## 2021-04-19 MED ORDER — NUVARING 0.12-0.015 MG/24HR VA RING
VAGINAL_RING | VAGINAL | 3 refills | Status: DC
Start: 2021-04-19 — End: 2022-06-04

## 2021-04-19 NOTE — Telephone Encounter (Signed)
Pt called stating that's she has a new insurance- I updated in chart. Pt called pharmacy and they need a new Rx for her nuvaring- pt states that she is due for it now. She is using [facility] on [address]. Please advise.  ?

## 2021-04-26 NOTE — Progress Notes (Signed)
? ? ? ?Chief Complaint:  ? ?OBESITY ?Rose [name] is here to discuss her progress with her obesity treatment plan along with follow-up of her obesity related diagnoses. [name] is on the Category 3 Plan and states she is following her eating plan approximately 90% of the time. [name] states she is walking 1 mile (7500-10,000 steps) 7 times per week, and at the gym for 90 minutes 2 times per week. ? ?Today's visit was #: 3 ?Starting weight: 241 lbs ?Starting date: 03/20/2021 ?Today's weight: 237 lbs ?Today's date: 04/19/2021 ?Total lbs lost to date: 4 ?Total lbs lost since last in-office visit: 4 ? ?Interim History: [name] has written all food intake down over the last few weeks. Weighing and measuring food. Noticing that she is still struggling to get all food in over the day. Realizes that [name] is only food option that gives her enough calories and protein. She notes it is easier to follow the plan during the week and weekends are harder. She has plans to go to country music festival at [facility] and also going to [employer].  ? ?Subjective:  ? ?1. Hypertriglyceridemia ?Elisabel's last triglycerides was 246, HDL 47, and LDL 82. She is not on medications. ? ?Assessment/Plan:  ? ?1. Hypertriglyceridemia ?We will repeat labs in July. No change in meal plan at this time.  ? ?2. Obesity with current BMI of 37.1 ?[name] is currently in the action stage of change. As such, her goal is to continue with weight loss efforts. She has agreed to the Category 3 Plan.  ? ?Exercise goals: All adults should avoid inactivity. Some physical activity is better than none, and adults who participate in any amount of physical activity gain some health benefits. ? ?Behavioral modification strategies: increasing lean protein intake, meal planning and cooking strategies, and keeping healthy foods in the home. ? ?[name] has agreed to follow-up with our clinic in 3 weeks. She was informed of the importance of frequent follow-up  visits to maximize her success with intensive lifestyle modifications for her multiple health conditions.  ? ?Objective:  ? ?Blood pressure 98/64, pulse 77, temperature 98 ?F (36.7 ?C), height 5\' 7"  (1.702 m), weight 237 lb (107.5 kg), SpO2 98 %. ?Body mass index is 37.12 kg/m?. ? ?General: Cooperative, alert, well developed, in no acute distress. ?HEENT: Conjunctivae and lids unremarkable. ?Cardiovascular: Regular rhythm.  ?Lungs: Normal work of breathing. ?Neurologic: No focal deficits.  ? ?Lab Results  ?Component Value Date  ? CREATININE 0.79 03/20/2021  ? BUN 10 03/20/2021  ? NA 141 03/20/2021  ? K 4.3 03/20/2021  ? CL 103 03/20/2021  ? CO2 20 03/20/2021  ? ?Lab Results  ?Component Value Date  ? ALT 30 03/20/2021  ? AST 37 03/20/2021  ? ALKPHOS 61 03/20/2021  ? BILITOT 0.4 03/20/2021  ? ?Lab Results  ?Component Value Date  ? HGBA1C 5.2 03/20/2021  ? HGBA1C 5.1 04/03/2015  ? ?Lab Results  ?Component Value Date  ? INSULIN 18.2 03/20/2021  ? ?Lab Results  ?Component Value Date  ? TSH 1.460 03/20/2021  ? ?Lab Results  ?Component Value Date  ? CHOL 170 03/20/2021  ? HDL 47 03/20/2021  ? LDLCALC 82 03/20/2021  ? TRIG 246 (H) 03/20/2021  ? CHOLHDL 3.6 03/15/2020  ? ?Lab Results  ?Component Value Date  ? VD25OH 26.1 (L) 03/20/2021  ? ?Lab Results  ?Component Value Date  ? WBC 8.1 03/20/2021  ? HGB 15.0 03/20/2021  ? HCT 42.3 03/20/2021  ? MCV 89 03/20/2021  ?  PLT 252 03/20/2021  ? ?No results found for: IRON, TIBC, FERRITIN ? ?Attestation Statements:  ? ?Reviewed by clinician on day of visit: allergies, medications, problem list, medical history, surgical history, family history, social history, and previous encounter notes. ? ? ?I, Burt Knack, am acting as transcriptionist for Reuben Likes, MD. ? ?I have reviewed the above documentation for accuracy and completeness, and I agree with the above. Reuben Likes, MD ? ? ?

## 2021-04-30 ENCOUNTER — Other Ambulatory Visit (INDEPENDENT_AMBULATORY_CARE_PROVIDER_SITE_OTHER): Payer: Self-pay | Admitting: Family Medicine

## 2021-04-30 DIAGNOSIS — E559 Vitamin D deficiency, unspecified: Secondary | ICD-10-CM

## 2021-05-17 ENCOUNTER — Ambulatory Visit (INDEPENDENT_AMBULATORY_CARE_PROVIDER_SITE_OTHER): Payer: BC Managed Care – PPO | Admitting: Family Medicine

## 2021-05-23 NOTE — Patient Instructions (Signed)
Breast Self-Awareness Breast self-awareness is knowing how your breasts look and feel. You need to: Check your breasts on a regular basis. Tell your doctor about any changes. Become familiar with the look and feel of your breasts. This can help you catch a breast problem while it is still small and can be treated. You should do breast self-exams even if you have breast implants. What you need: A mirror. A well-lit room. A pillow or other soft object. How to do a breast self-exam Follow these steps to do a breast self-exam: Look for changes  Take off all the clothes above your waist. Stand in front of a mirror in a room with good lighting. Put your hands down at your sides. Compare your breasts in the mirror. Look for any difference between them, such as: A difference in shape. A difference in size. Wrinkles, dips, and bumps in one breast and not the other. Look at each breast for changes in the skin, such as: Redness. Scaly areas. Skin that has gotten thicker. Dimpling. Open sores (ulcers). Look for changes in your nipples, such as: Fluid coming out of a nipple. Fluid around a nipple. Bleeding. Dimpling. Redness. A nipple that looks pushed in (retracted), or that has changed position. Feel for changes Lie on your back. Feel each breast. To do this: Pick a breast to feel. Place a pillow under the shoulder closest to that breast. Put the arm closest to that breast behind your head. Feel the nipple area of that breast using the hand of your other arm. Feel the area with the pads of your three middle fingers by making small circles with your fingers. Use light, medium, and firm pressure. Continue the overlapping circles, moving downward over the breast. Keep making circles with your fingers. Stop when you feel your ribs. Start making circles with your fingers again, this time going upward until you reach your collarbone. Then, make circles outward across your breast and into your  armpit area. Squeeze your nipple. Check for discharge and lumps. Repeat these steps to check your other breast. Sit or stand in the tub or shower. With soapy water on your skin, feel each breast the same way you did when you were lying down. Write down what you find Writing down what you find can help you remember what to tell your doctor. Write down: What is normal for each breast. Any changes you find in each breast. These include: The kind of changes you find. A tender or painful breast. Any lump you find. Write down its size and where it is. When you last had your monthly period (menstrual cycle). General tips If you are breastfeeding, the best time to check your breasts is after you feed your baby or after you use a breast pump. If you get monthly bleeding, the best time to check your breasts is 5-7 days after your monthly cycle ends. With time, you will become comfortable with the self-exam. You will also start to know if there are changes in your breasts. Contact a doctor if: You see a change in the shape or size of your breasts or nipples. You see a change in the skin of your breast or nipples, such as red or scaly skin. You have fluid coming from your nipples that is not normal. You find a new lump or thick area. You have breast pain. You have any concerns about your breast health. Summary Breast self-awareness includes looking for changes in your breasts and feeling for changes   within your breasts. You should do breast self-awareness in front of a mirror in a well-lit room. If you get monthly periods (menstrual cycles), the best time to check your breasts is 5-7 days after your period ends. Tell your doctor about any changes you see in your breasts. Changes include changes in size, changes on the skin, painful or tender breasts, or fluid from your nipples that is not normal. This information is not intended to replace advice given to you by your health care provider. Make sure  you discuss any questions you have with your health care provider. Document Revised: 10/26/2020 Document Reviewed: 10/26/2020 Elsevier Patient Education  2023 [employer] Preventive Care 21-39 Years Old, Female Preventive care refers to lifestyle choices and visits with your health care provider that can promote health and wellness. Preventive care visits are also called wellness exams. What can I expect for my preventive care visit? Counseling During your preventive care visit, your health care provider may ask about your: Medical history, including: Past medical problems. Family medical history. Pregnancy history. Current health, including: Menstrual cycle. Method of birth control. Emotional well-being. Home life and relationship well-being. Sexual activity and sexual health. Lifestyle, including: Alcohol, nicotine or tobacco, and drug use. Access to firearms. Diet, exercise, and sleep habits. Work and work environment. Sunscreen use. Safety issues such as seatbelt and bike helmet use. Physical exam Your health care provider may check your: Height and weight. These may be used to calculate your BMI (body mass index). BMI is a measurement that tells if you are at a healthy weight. Waist circumference. This measures the distance around your waistline. This measurement also tells if you are at a healthy weight and may help predict your risk of certain diseases, such as type 2 diabetes and high blood pressure. Heart rate and blood pressure. Body temperature. Skin for abnormal spots. What immunizations do I need?  Vaccines are usually given at various ages, according to a schedule. Your health care provider will recommend vaccines for you based on your age, medical history, and lifestyle or other factors, such as travel or where you work. What tests do I need? Screening Your health care provider may recommend screening tests for certain conditions. This may include: Pelvic exam  and Pap test. Lipid and cholesterol levels. Diabetes screening. This is done by checking your blood sugar (glucose) after you have not eaten for a while (fasting). Hepatitis B test. Hepatitis C test. HIV (human immunodeficiency virus) test. STI (sexually transmitted infection) testing, if you are at risk. BRCA-related cancer screening. This may be done if you have a family history of breast, ovarian, tubal, or peritoneal cancers. Talk with your health care provider about your test results, treatment options, and if necessary, the need for more tests. Follow these instructions at home: Eating and drinking  Eat a healthy diet that includes fresh fruits and vegetables, whole grains, lean protein, and low-fat dairy products. Take vitamin and mineral supplements as recommended by your health care provider. Do not drink alcohol if: Your health care provider tells you not to drink. You are pregnant, may be pregnant, or are planning to become pregnant. If you drink alcohol: Limit how much you have to 0-1 drink a day. Know how much alcohol is in your drink. In the [place] one drink equals one 12 oz bottle of beer (355 mL), one 5 oz glass of wine (148 mL), or one 1 oz glass of hard liquor (44 mL). Lifestyle Brush your teeth every morning and   night with fluoride toothpaste. Floss one time each day. Exercise for at least 30 minutes 5 or more days each week. Do not use any products that contain nicotine or tobacco. These products include cigarettes, chewing tobacco, and vaping devices, such as e-cigarettes. If you need help quitting, ask your health care provider. Do not use drugs. If you are sexually active, practice safe sex. Use a condom or other form of protection to prevent STIs. If you do not wish to become pregnant, use a form of birth control. If you plan to become pregnant, see your health care provider for a prepregnancy visit. Find healthy ways to manage stress, such as: Meditation, yoga, or  listening to music. Journaling. Talking to a trusted person. Spending time with friends and family. Minimize exposure to UV radiation to reduce your risk of skin cancer. Safety Always wear your seat belt while driving or riding in a vehicle. Do not drive: If you have been drinking alcohol. Do not ride with someone who has been drinking. If you have been using any mind-altering substances or drugs. While texting. When you are tired or distracted. Wear a helmet and other protective equipment during sports activities. If you have firearms in your house, make sure you follow all gun safety procedures. Seek help if you have been physically or sexually abused. What's next? Go to your health care provider once a year for an annual wellness visit. Ask your health care provider how often you should have your eyes and teeth checked. Stay up to date on all vaccines. This information is not intended to replace advice given to you by your health care provider. Make sure you discuss any questions you have with your health care provider. Document Revised: 06/21/2020 Document Reviewed: 06/21/2020 Elsevier Patient Education  2023 [employer] Inc[employer]  

## 2021-05-23 NOTE — Progress Notes (Signed)
GYNECOLOGY ANNUAL PHYSICAL EXAM PROGRESS NOTE  Subjective:    Rose [name] is a 31 y.o. [id number] female who presents for an annual exam.  The patient is sexually active. The patient participates in regular exercise: yes. Has the patient ever been transfused or tattooed?: yes. The patient reports that there is not domestic violence in her life.   The patient has the following complaints today.  Reports that her menstrual cycles have become more painful. Is using NuvaRing for contraception.  Is noting premenstrual pain, as well as pain even up to a week after her cycle. Post-cycle pain is mostly right sided.  Attempts to manage with NSAIDs. Also noted issues with acne last year but has improved   Notes that she [facility] the [facility] 2 months. Has been on high protein diet but feels like she can't tolerate eating the amount of protein as it is too much for her sometimes.  Also is somewhat disappointed as she has only lost 3 lbs so far. Is exercising regularly.    Menstrual History: Menarche age: 23 Patient's last menstrual period was 03/09/2021. Period Duration (Days): 7 Period Pattern: (!) Irregular Menstrual Flow: Heavy Menstrual Control Change Freq (Hours): 3-4 Dysmenorrhea: (!) Moderate Dysmenorrhea Symptoms: Cramping, Throbbing, Diarrhea, Headache   Gynecologic History:  Contraception: NuvaRing vaginal inserts History of STI's: History of chlamydia (2013) Last Pap: 11/18/2018. Results were: normal.  Denies  h/o abnormal pap smears.     Upstream - 05/24/21 [id number]       Pregnancy Intention Screening   Does the patient want to become pregnant in the next year? No    Does the patient's partner want to become pregnant in the next year? No    Would the patient like to discuss contraceptive options today? No      Contraception Wrap Up   Current Method Vaginal Ring    End Method Vaginal Ring    Contraception Counseling  Provided No            The pregnancy intention screening data noted above was reviewed. Potential methods of contraception were discussed. The patient elected to proceed with Vaginal Ring.   OB History  Gravida Para Term Preterm AB Living  2 2 2  0 0 2  SAB IAB Ectopic Multiple Live Births  0 0 0 0 2    # Outcome Date GA Lbr Len/2nd Weight Sex Delivery Anes PTL Lv  2 Term 11/14/15 [redacted]w[redacted]d / 00:12 8 lb 10.3 oz (3.92 kg) M Vag-Spont EPI  LIV     Name: [name],[name]     Apgar1: 9  Apgar5: 9  1 Term 2011 [redacted]w[redacted]d   M Vag-Spont None N LIV    Obstetric Comments  Postpartum hemorrhage after 1st delivery, did not require blood transfusion.     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    Personal history UTI     Past Surgical History:  Procedure Laterality Date   APPENDECTOMY  2013   acute appendicitis   EYE SURGERY     LAPAROSCOPIC APPENDECTOMY  02/03/2012   Procedure: APPENDECTOMY LAPAROSCOPIC;  Surgeon: Imogene Burn. Georgette Dover, MD;  Location:  [facility] OR;  Service: General;  Laterality: N/A;   lymph node biopsy     Left neck after cat scratch fever   TYMPANOSTOMY TUBE PLACEMENT      Family History  Problem Relation Age of Onset   COPD Mother    Asthma Mother    Lung cancer Maternal Grandmother     Social History   Socioeconomic History   Marital status: Single    Spouse name: Not on file   Number of children: 1   Years of education: 14   Highest education level: Not on file  Occupational History   Occupation: Scientist, water quality at [employer]   Occupation: dispatcher  Tobacco Use   Smoking status: Former    Types: Cigarettes    Quit date: 2022    Years since quitting: 1.3   Smokeless tobacco: Never   Tobacco comments:    One pack per week.   Vaping Use   Vaping Use: Some days  Substance and Sexual Activity   Alcohol use: Yes    Alcohol/week: 0.0 standard drinks    Comment: Occasion use - socially   Drug use: Not Currently    Types: Marijuana   Sexual activity: Yes    Birth control/protection: Implant  Other Topics Concern   Not on file  Social History Narrative   Lives at home with her son.   Right-handed.   6-8 cups caffeine per day.   Social Determinants of Health   Financial Resource Strain: Not on file  Food Insecurity: Not on file  Transportation Needs: Not on file  Physical Activity: Not on file  Stress: Not on file  Social Connections: Not on file  Intimate Partner Violence: Not on file    Current Outpatient Medications on File Prior to Visit  Medication Sig Dispense Refill   acetaminophen (TYLENOL) 500 MG tablet Take 500 mg by mouth every 6 (six) hours as needed.     Aspirin-Acetaminophen-Caffeine (EXCEDRIN MIGRAINE PO) Take by mouth.     ibuprofen (ADVIL,MOTRIN) 200 MG tablet Take 200 mg by mouth every 6 (six) hours as needed.     Multiple Vitamins-Minerals (WOMENS MULTIVITAMIN PO) Take by mouth.     NUVARING 0.12-0.015 MG/24HR vaginal ring INSERT ONE VAGINALLY AND LEAVE IN PLACE FOR THREE CONSECUTIVE WEEKS. THEN REMOVE FOR ONE WEEK 3 each 3   Prenatal Vit-Fe Fumarate-FA (PRENATAL VITAMINS PO) Take by mouth.     sulfamethoxazole-trimethoprim (BACTRIM DS) 800-160 MG tablet Take 1 tablet by mouth 2 (two) times daily.     SUMAtriptan Succinate (IMITREX PO) Take by mouth.     Vitamin D, Ergocalciferol, (DRISDOL) 1.25 MG (50000 UNIT) CAPS capsule Take 1 capsule (50,000 Units total) by mouth every 7 (seven) days. 4 capsule 0   No current facility-administered medications on file prior to visit.    Allergies  Allergen Reactions   Penicillins Hives    Has patient had a PCN reaction causing immediate rash, facial/tongue/throat swelling, SOB or lightheadedness with hypotension: Yes Has patient had a PCN  reaction causing severe rash involving mucus membranes or skin necrosis: No Has patient had a PCN reaction that required hospitalization Yes Has patient had a PCN reaction occurring within the last 10 years: No If all of the above answers are "NO", then may proceed with Cephalosporin use.      Review of Systems Constitutional: negative for chills, fatigue, fevers and sweats Eyes: negative for irritation, redness and visual disturbance Ears, nose, mouth, throat, and face: negative for hearing loss, nasal congestion,  snoring and tinnitus Respiratory: negative for asthma, cough, sputum Cardiovascular: negative for chest pain, dyspnea, exertional chest pressure/discomfort, irregular heart beat, palpitations and syncope Gastrointestinal: negative for abdominal pain, change in bowel habits, nausea and vomiting Genitourinary: negative for abnormal menstrual periods, genital lesions, sexual problems and vaginal discharge, dysuria and urinary incontinence. Positive for painful menses, pelvic pain (right sided) Integument/breast: negative for breast lump, breast tenderness and nipple discharge Hematologic/lymphatic: negative for bleeding and easy bruising Musculoskeletal:negative for back pain and muscle weakness Neurological: negative for dizziness, headaches, vertigo and weakness Endocrine: negative for diabetic symptoms including polydipsia, polyuria and skin dryness Allergic/Immunologic: negative for hay fever and urticaria      Objective:  Blood pressure 124/77, pulse 78, resp. rate 16, height 5\' 9"  (1.753 m), weight 242 lb 8 oz (110 kg), last menstrual period 03/09/2021.  Body mass index is 35.81 kg/m.   General Appearance:    Alert, cooperative, no distress, appears stated age, moderate obesity  Head:    Normocephalic, without obvious abnormality, atraumatic  Eyes:    PERRL, conjunctiva/corneas clear, EOM's intact, both eyes  Ears:    Normal external ear canals, both ears  Nose:   Nares  normal, septum midline, mucosa normal, no drainage or sinus tenderness  Throat:   Lips, mucosa, and tongue normal; teeth and gums normal  Neck:   Supple, symmetrical, trachea midline, no adenopathy; thyroid: no enlargement/tenderness/nodules; no carotid bruit or JVD  Back:     Symmetric, no curvature, ROM normal, no CVA tenderness  Lungs:     Clear to auscultation bilaterally, respirations unlabored  Chest Wall:    No tenderness or deformity   Heart:    Regular rate and rhythm, S1 and S2 normal, no murmur, rub or gallop  Breast Exam:    No tenderness, masses, or nipple abnormality  Abdomen:     Soft, non-tender, bowel sounds active all four quadrants, no masses, no organomegaly.    Genitalia:    Pelvic:external genitalia normal, vagina without lesions, discharge, or tenderness, rectovaginal septum  normal. NuvaRing in place. Cervix normal in appearance, no cervical motion tenderness. Uterus normal size, shape, mobile, regular contours, nontender. Adnexal tenderness with fullness of right side, possible cyst. Left side non-tender.   Rectal:    Normal external sphincter.  No hemorrhoids appreciated. Internal exam not done.   Extremities:   Extremities normal, atraumatic, no cyanosis or edema  Pulses:   2+ and symmetric all extremities  Skin:   Skin color, texture, turgor normal, no rashes or lesions  Lymph nodes:   Cervical, supraclavicular, and axillary nodes normal  Neurologic:   CNII-XII intact, normal strength, sensation and reflexes throughout    Labs:  Lab Results  Component Value Date   WBC 8.1 03/20/2021   HGB 15.0 03/20/2021   HCT 42.3 03/20/2021   MCV 89 03/20/2021   PLT 252 03/20/2021    Lab Results  Component Value Date   CREATININE 0.79 03/20/2021   BUN 10 03/20/2021   NA 141 03/20/2021   K 4.3 03/20/2021   CL 103 03/20/2021   CO2 20 03/20/2021    Lab Results  Component Value Date   ALT 30 03/20/2021   AST 37 03/20/2021   ALKPHOS 61 03/20/2021   BILITOT 0.4  03/20/2021    Lab Results  Component Value Date   TSH 1.460 03/20/2021    Lab Results  Component Value Date   CHOL 170 03/20/2021   HDL 47 03/20/2021   LDLCALC 82 03/20/2021  TRIG 246 (H) 03/20/2021   CHOLHDL 3.6 03/15/2020    Lab Results  Component Value Date   HGBA1C 5.2 03/20/2021      Assessment:   1. Well woman exam with routine gynecological exam   2. Pelvic pain   3. Obesity (BMI 30-39.9)   4. Dysmenorrhea   5. Encounter for surveillance of vaginal ring hormonal contraceptive device   6. Vitamin D insufficiency   7. Vitamin B 12 deficiency   8. High triglycerides      Plan:   1. Well woman exam with routine gynecological exam Blood tests: up to date Breast self exam technique reviewed and patient encouraged to perform self-exam monthly. Contraception: NuvaRing vaginal inserts. Pap smear  UTD . Follow up in 1 year for annual exam  2. Pelvic pain - Right sided pain noted outside of menses.  Fullness noted on right side on today's exam. Will order pelvic ultrasound to rule out adnexal cyst.   3. Obesity (BMI 30-39.9) -  Discussed healthy lifestyle modifications. -Currently attending weight loss clinic, however patient notes that she does not know how much longer she will be able to continue due to cost (notes that she has to pay approximately $200 for each visit and is having to go every 2 weeks).  Also is disappointed that she has been going for approximately 2 months and has only lost 2 to 3 pounds.  I discussed other weight loss management options for her again.  Patient notes at this time she would be willing to try a medication.  I discussed that the most cost effective option would be to initiate phentermine and Topamax.  Patient notes that she is okay with this plan, will prescribe. The risks and benefits and side effects of medication were discussed. The pros and cons of suppressing appetite and boosting metabolism were discussed. Risks of tolerence and  addiction is discussed for selected agents discussed. Use of medicine will ne short term, such as 3-4 months at a time followed by a period of time off of the medicine to avoid these risks and side effects for Adipex discussed. Pt to call with any negative side effects and agrees to keep follow up appts. -We will follow-up in 1 month to reassess weight loss.  Advised to continue on current dietary regimen (high protein/low carb).   4. Dysmenorrhea -Patient currently has NuvaRing, using for contraception and menstrual management.  I discussed option of changing to a different method to perhaps better improve her symptoms.  Patient notes that she does not do well with pills, and has a history of migraines so is limited on her options.  Also would not recommend Depo-Provera at this time as patient is trying to lose weight.  Offered option of using NuvaRing as a continuous method, patient notes that she has tried this before with 2 months with the ring in and then took it out for several months to help manage her acne.  Still notes that she had a cycle even with the ring being in.  Patient notes at this time she will continue use of the ring cyclically, and continue use NSAIDs.  Also advised on use of Pamprin or Midol during her cycle as these sometimes help better than the NSAIDs.  Could also consider prescription NSAIDs if pain worsens.  5. Encounter for surveillance of vaginal ring hormonal contraceptive device -Patient to continue NuvaRing for contraception.  6. Vitamin D insufficiency -Mild vitamin D insufficiency, encourage supplementation.  7. Vitamin B 12  deficiency -Mild vitamin D insufficiency, encourage supplementation.  While receiving weight loss management, can also consider monthly injections.  8. High triglycerides -Is trying to manage with dietary modification and exercise.   A total of 40 minutes were spent during this encounter, with over half of that time spent ace-to-face with the  patient, reviewing previous lab results, and counseling/coordination of care.   [name], MD [facility]

## 2021-05-24 ENCOUNTER — Encounter: Payer: Self-pay | Admitting: Obstetrics and Gynecology

## 2021-05-24 ENCOUNTER — Other Ambulatory Visit (INDEPENDENT_AMBULATORY_CARE_PROVIDER_SITE_OTHER): Payer: Self-pay | Admitting: Family Medicine

## 2021-05-24 ENCOUNTER — Ambulatory Visit (INDEPENDENT_AMBULATORY_CARE_PROVIDER_SITE_OTHER): Payer: BC Managed Care – PPO | Admitting: Obstetrics and Gynecology

## 2021-05-24 VITALS — BP 124/77 | HR 78 | Resp 16 | Ht 69.0 in | Wt 242.5 lb

## 2021-05-24 DIAGNOSIS — R102 Pelvic and perineal pain unspecified side: Secondary | ICD-10-CM

## 2021-05-24 DIAGNOSIS — N946 Dysmenorrhea, unspecified: Secondary | ICD-10-CM | POA: Diagnosis not present

## 2021-05-24 DIAGNOSIS — E669 Obesity, unspecified: Secondary | ICD-10-CM | POA: Diagnosis not present

## 2021-05-24 DIAGNOSIS — E538 Deficiency of other specified B group vitamins: Secondary | ICD-10-CM

## 2021-05-24 DIAGNOSIS — Z01419 Encounter for gynecological examination (general) (routine) without abnormal findings: Secondary | ICD-10-CM | POA: Diagnosis not present

## 2021-05-24 DIAGNOSIS — E559 Vitamin D deficiency, unspecified: Secondary | ICD-10-CM

## 2021-05-24 DIAGNOSIS — Z3044 Encounter for surveillance of vaginal ring hormonal contraceptive device: Secondary | ICD-10-CM

## 2021-05-24 DIAGNOSIS — E781 Pure hyperglyceridemia: Secondary | ICD-10-CM

## 2021-05-24 MED ORDER — PHENTERMINE HCL 15 MG PO CAPS: ORAL_CAPSULE | ORAL | 0 refills | Status: DC

## 2021-05-24 MED ORDER — TOPIRAMATE 25 MG PO TABS: 25.0000 mg | ORAL_TABLET | Freq: Two times a day (BID) | ORAL | 3 refills | Status: DC

## 2021-05-25 ENCOUNTER — Encounter: Payer: Medicaid Other | Admitting: Obstetrics and Gynecology

## 2021-06-05 ENCOUNTER — Ambulatory Visit (INDEPENDENT_AMBULATORY_CARE_PROVIDER_SITE_OTHER): Payer: BC Managed Care – PPO | Admitting: Family Medicine

## 2021-06-13 ENCOUNTER — Ambulatory Visit (INDEPENDENT_AMBULATORY_CARE_PROVIDER_SITE_OTHER): Payer: BC Managed Care – PPO

## 2021-06-13 DIAGNOSIS — R102 Pelvic and perineal pain: Secondary | ICD-10-CM

## 2021-06-22 ENCOUNTER — Other Ambulatory Visit: Payer: Self-pay

## 2021-06-22 ENCOUNTER — Telehealth: Payer: Self-pay | Admitting: Obstetrics and Gynecology

## 2021-06-22 DIAGNOSIS — E669 Obesity, unspecified: Secondary | ICD-10-CM

## 2021-06-22 NOTE — Telephone Encounter (Signed)
Pt states that  she does not have enough medication (weight loss) to get her to upcoming apt 7/05. Pt contacted pharmacy they advised she need to call office as she is taking 2 a day. Please advise.

## 2021-06-23 MED ORDER — PHENTERMINE HCL 30 MG PO CAPS: 30.0000 mg | ORAL_CAPSULE | ORAL | 0 refills | Status: DC

## 2021-06-23 NOTE — Telephone Encounter (Signed)
Prescription sent into the pharmacy 

## 2021-07-11 ENCOUNTER — Ambulatory Visit (INDEPENDENT_AMBULATORY_CARE_PROVIDER_SITE_OTHER): Payer: BC Managed Care – PPO | Admitting: Obstetrics and Gynecology

## 2021-07-11 ENCOUNTER — Encounter: Payer: Self-pay | Admitting: Obstetrics and Gynecology

## 2021-07-11 VITALS — BP 118/74 | HR 85 | Ht 68.0 in | Wt 236.0 lb

## 2021-07-11 DIAGNOSIS — E781 Pure hyperglyceridemia: Secondary | ICD-10-CM

## 2021-07-11 DIAGNOSIS — E669 Obesity, unspecified: Secondary | ICD-10-CM

## 2021-07-11 DIAGNOSIS — Z7689 Persons encountering health services in other specified circumstances: Secondary | ICD-10-CM

## 2021-07-11 DIAGNOSIS — T887XXA Unspecified adverse effect of drug or medicament, initial encounter: Secondary | ICD-10-CM

## 2021-07-11 MED ORDER — PHENTERMINE HCL 37.5 MG PO CAPS: 37.5000 mg | ORAL_CAPSULE | ORAL | 1 refills | Status: DC

## 2021-07-11 NOTE — Progress Notes (Signed)
GYNECOLOGY PROGRESS NOTE  Subjective:    Patient ID: Rose [name], female    DOB: [birth year], 31 y.o.   MRN: [id number]  HPI  Patient is a 31 y.o. female who presents for 6 week weight management follow up. She has a past history of obesity. She initiated use of 6 weeks ago.  Reports compliance with medications. Currently on 30 mg dosing of Phentermine (titrated up from 15 mg). Does note some tingling in her feet, that she feels may be due to the Topamax. However notes that it does help to to control her migraines.    Current interventions:  1. Diet - notes she was not as compliant with her diet as she had vacation and birthday celebration.  Now is back to protein shakes and yogurt for breakfast, lunch includes meat with veggies, dinner includes a meat and veggies.  2. Activity - 30 min walking daily. Goal is to start also working out in the gym 3 days per week.  3. Reports bowel movements are normal.    The following portions of the patient's history were reviewed and updated as appropriate: allergies, current medications, past family history, past medical history, past social history, past surgical history, and problem list.  Review of Systems Pertinent items noted in HPI and remainder of comprehensive ROS otherwise negative.   Objective:       07/11/2021    3:55 PM 05/24/2021    8:03 AM 04/19/2021    2:00 PM  Vitals with BMI  Height 5\' 8"  5\' 9"  5\' 7"   Weight 236 lbs 242 lbs 8 oz 237 lbs  BMI 35.89 35.79 37.11  Systolic 118 124 98  Diastolic 74 77 64  Pulse 85 78 77    General appearance: alert and no distress Abdomen: soft, non-tender.  Waist circumference 43 in.    Labs:  Lab Results  Component Value Date   CHOL 170 03/20/2021   HDL 47 03/20/2021   LDLCALC 82 03/20/2021   TRIG 246 (H) 03/20/2021   CHOLHDL 3.6 03/15/2020    Lab Results  Component Value Date   HGBA1C 5.2 03/20/2021    Assessment:    1. Encounter for weight management   2. Obesity  (BMI 35.0-39.9 without comorbidity)   3. High triglycerides   4. Side effect of medication      Plan:   Weight management  - can continue current management,  has lost some weight since starting the medication, however does note that she was not as compliant with her diet this month due to vacation and celebrating her birthday.  Has now recently restarted dietary modifications again.  Also encouraged to begin with cardio workouts or weight lifting as this will help expedite her weight loss.  We will increase dosing of phentermine to 37.5 mg.  Is patient currently tolerating 30 mg dosing without any side effects.  Discussed option of discontinuing the Topamax due to side effects of tingling however patient notes that she would prefer to remain on the medication for now.  Advised that no harmful effects would be encountered. We will follow-up with triglycerides at next visit if patient has lost a more substantial amount of weight by that time. Side effects of Topamax discussed with patient.  Patient is going to continue medication for now.  To follow-up in 2 months for weight check.  Advised that if she has not lost at least 10% of her total body weight, it would be recommended to discontinue medications at  that time.    Hildred Laser, MD [facility]

## 2021-08-15 ENCOUNTER — Encounter (INDEPENDENT_AMBULATORY_CARE_PROVIDER_SITE_OTHER): Payer: Self-pay

## 2021-09-07 NOTE — Progress Notes (Unsigned)
    GYNECOLOGY PROGRESS NOTE  Subjective:    Patient ID: Rose [name], female    DOB: 04/19/1990, 31 y.o.   MRN: [id number]  HPI  Patient is a 31 y.o. female who presents for {NUMBER 1-10:22536} month weight management follow up. She has a past history of obesity, ***. She initiated use of *** {NUMBER 1-10:22536} months ago.  Denies any undesirable side effects and reports compliance with medications.    Current interventions:  1. Diet -  2. Activity -  3. Reports bowel movements are ***.    {Common ambulatory SmartLinks:19316}  Review of Systems {ros; complete:30496}   Objective:       07/11/2021    3:55 PM 05/24/2021    8:03 AM 04/19/2021    2:00 PM  Vitals with BMI  Height 5\' 8"  5\' 9"  5\' 7"   Weight 236 lbs 242 lbs 8 oz 237 lbs  BMI 35.89 35.79 37.11  Systolic 118 124 98  Diastolic 74 77 64  Pulse 85 78 77    General appearance: {general exam:16600} Abdomen: soft, non-tender.  Waist circumference *** in.    Labs:   Assessment:   Weight management Obesity, There is no height or weight on file to calculate BMI.  Plan:   Weight management  - doing well with weight loss, can continue current management.      , MD [facility]

## 2021-09-11 ENCOUNTER — Ambulatory Visit (INDEPENDENT_AMBULATORY_CARE_PROVIDER_SITE_OTHER): Payer: BC Managed Care – PPO | Admitting: Obstetrics and Gynecology

## 2021-09-11 ENCOUNTER — Encounter: Payer: Self-pay | Admitting: Obstetrics and Gynecology

## 2021-09-11 VITALS — BP 119/75 | HR 68 | Resp 16 | Ht 68.0 in | Wt 224.1 lb

## 2021-09-11 DIAGNOSIS — F439 Reaction to severe stress, unspecified: Secondary | ICD-10-CM

## 2021-09-11 DIAGNOSIS — E669 Obesity, unspecified: Secondary | ICD-10-CM | POA: Diagnosis not present

## 2021-09-11 DIAGNOSIS — Z7689 Persons encountering health services in other specified circumstances: Secondary | ICD-10-CM

## 2021-09-11 DIAGNOSIS — R4586 Emotional lability: Secondary | ICD-10-CM

## 2021-09-14 ENCOUNTER — Other Ambulatory Visit: Payer: Self-pay | Admitting: Obstetrics and Gynecology

## 2021-09-17 ENCOUNTER — Other Ambulatory Visit: Payer: Self-pay | Admitting: Obstetrics and Gynecology

## 2021-09-19 ENCOUNTER — Other Ambulatory Visit: Payer: Self-pay | Admitting: Obstetrics and Gynecology

## 2021-10-15 ENCOUNTER — Other Ambulatory Visit: Payer: Self-pay | Admitting: Obstetrics and Gynecology

## 2021-10-31 ENCOUNTER — Telehealth: Payer: Self-pay | Admitting: Obstetrics and Gynecology

## 2021-10-31 ENCOUNTER — Telehealth: Payer: BC Managed Care – PPO | Admitting: Obstetrics and Gynecology

## 2021-11-01 ENCOUNTER — Telehealth: Payer: Self-pay

## 2021-11-01 NOTE — Telephone Encounter (Signed)
Patient had contacted office to inquire about prescription for phentermine. After reviewing chart prescription was filled on 10/15/21. I contacted pharmacy who states that prescription has not been filled yet, I contacted patient and advised her to contact pharmacy so prescription is filled. KW

## 2021-11-21 ENCOUNTER — Telehealth (INDEPENDENT_AMBULATORY_CARE_PROVIDER_SITE_OTHER): Payer: BC Managed Care – PPO | Admitting: Obstetrics and Gynecology

## 2021-11-21 ENCOUNTER — Encounter: Payer: Self-pay | Admitting: Obstetrics and Gynecology

## 2021-11-21 VITALS — Resp 16 | Ht 67.0 in | Wt 220.0 lb

## 2021-11-21 DIAGNOSIS — Z7689 Persons encountering health services in other specified circumstances: Secondary | ICD-10-CM

## 2021-11-21 DIAGNOSIS — Z713 Dietary counseling and surveillance: Secondary | ICD-10-CM | POA: Diagnosis not present

## 2021-11-21 DIAGNOSIS — E669 Obesity, unspecified: Secondary | ICD-10-CM | POA: Diagnosis not present

## 2021-11-21 DIAGNOSIS — Z6834 Body mass index (BMI) 34.0-34.9, adult: Secondary | ICD-10-CM

## 2021-11-21 MED ORDER — PHENTERMINE HCL 37.5 MG PO CAPS: ORAL_CAPSULE | ORAL | 0 refills | Status: DC

## 2021-11-21 NOTE — Progress Notes (Signed)
    GYNECOLOGY PROGRESS NOTE  Subjective:    Patient ID: Rose [name], female    DOB: 12-12-1990, 31 y.o.   MRN: [id number]  HPI  Patient is a 31 y.o. female who presents for 5 month weight management follow up. She has a past history of obesity. She initiated use of Phentermine and Topamax 5 months ago.  Denies any undesirable side effects and reports compliance with medications.  Reports that she was unable to take her medication for almost 1.5 weeks due to unavailability at the pharmacy.    Current interventions:  1. Diet - 3 eggs for breakfast, water, protein shakes, eats out for lunch and dinner she has protein and vegetables. 2. Activity - Walking for 30 minutes daily 3. Reports bowel movements are normal.    The following portions of the patient's history were reviewed and updated as appropriate: allergies, current medications, past family history, past medical history, past social history, past surgical history, and problem list.  Review of Systems Pertinent items noted in HPI and remainder of comprehensive ROS otherwise negative.   Objective:       11/21/2021    3:57 PM 09/11/2021    4:16 PM 07/11/2021    3:55 PM  Vitals with BMI  Height 5\' 7"  5\' 8"  5\' 8"   Weight 220 lbs 224 lbs 2 oz 236 lbs  BMI 34.45 34.08 35.89  Systolic  119 118  Diastolic  75 74  Pulse  68 85    General appearance: alert, cooperative, and no distress Abdomen: soft, non-tender.  Waist circumference: not obtained   Labs:  No new orders  Assessment:   Weight management Obesity, Body mass index is 34.46 kg/m.  Plan:   Weight management  -  weight loss, can continue current management.  However concerns for slower rate of weight loss. Will allow 1 additional month, if plateau noted, will hold medication at that time. Also continue Topamax.   RTC in 1 month.    , MD [facility]

## 2021-11-27 NOTE — Telephone Encounter (Signed)
Nomore documentation needed

## 2021-12-11 ENCOUNTER — Ambulatory Visit (INDEPENDENT_AMBULATORY_CARE_PROVIDER_SITE_OTHER): Payer: BC Managed Care – PPO | Admitting: Obstetrics and Gynecology

## 2021-12-11 ENCOUNTER — Encounter: Payer: Self-pay | Admitting: Obstetrics and Gynecology

## 2021-12-11 VITALS — BP 115/75 | HR 69 | Resp 16 | Ht 68.0 in | Wt 221.7 lb

## 2021-12-11 DIAGNOSIS — Z6833 Body mass index (BMI) 33.0-33.9, adult: Secondary | ICD-10-CM

## 2021-12-11 DIAGNOSIS — E669 Obesity, unspecified: Secondary | ICD-10-CM

## 2021-12-11 DIAGNOSIS — Z713 Dietary counseling and surveillance: Secondary | ICD-10-CM

## 2021-12-11 DIAGNOSIS — Z7689 Persons encountering health services in other specified circumstances: Secondary | ICD-10-CM

## 2021-12-11 MED ORDER — CYANOCOBALAMIN 1000 MCG/ML IJ SOLN
1000.0000 ug | Freq: Once | INTRAMUSCULAR | Status: AC
  Administered 2021-12-11: 1000 ug via INTRAMUSCULAR

## 2021-12-11 NOTE — Progress Notes (Signed)
    GYNECOLOGY PROGRESS NOTE  Subjective:    Patient ID: Lucilla Edin, female    DOB: 05-Jan-1991, 31 y.o.   MRN: [id number]  HPI  Patient is a 31 y.o. female who presents for 4 month weight management follow up. She has a past history of obesity. She initiated use of Phentermine and Topamax 4 months ago.  Denies any undesirable side effects and reports compliance with medications.    Current interventions:  1. Diet - 1 sugary beverage a week. She is watching what she eats, high protein diet. Eating healthier. 2. Activity - Walking daily for 30 minutes. Sometimes runs stairs at her son's practices, lunges. Has a gym membership but unable to utilize a she is a single mom and has no one to watch her kids.   3. Reports bowel movements are normal.    The following portions of the patient's history were reviewed and updated as appropriate: allergies, current medications, past family history, past medical history, past social history, past surgical history, and problem list.  Review of Systems Pertinent items are noted in HPI.   Objective:       12/11/2021    4:12 PM 11/21/2021    3:57 PM 09/11/2021    4:16 PM  Vitals with BMI  Height 5\' 8"  5\' 7"  5\' 8"   Weight 221 lbs 11 oz 220 lbs 224 lbs 2 oz  BMI 33.72 34.45 34.08  Systolic 115  119  Diastolic 75  75  Pulse 69  68    General appearance: alert, cooperative, and no distress Abdomen: soft, non-tender.  Waist circumference 37.5 in.    Labs:  No new labs  Assessment:   Weight management Obesity, Body mass index is 33.71 kg/m.  Plan:   Weight management  - apparently coming to plateau with weight loss on Phentermine. Has not lost much in lbs but is still losing inches. Clothes fit much looser.  Notes that this is about where she got in her weight loss journey last time, around 220 lbs would plateau. Did discuss option of initiation of B12 injections to help with weight loss, or alternating dose of phentermine for 1 mont.   Will try b12 injection first, then can try alternating dose next month if no improvement. If still no significant changes noted, will need to d/c phentermine and attempt use of additional weight loss medication. Encouraged increasing exercise regimen to strength or cardio.  Also discussed changing diet to one such as keto or mediterranean.  Patient also already performing intermittent fasting.   A total of 15 minutes were spent face-to-face with the patient during this encounter and over half of that time dealt with counseling and coordination of care.    , MD [facility]

## 2021-12-18 DIAGNOSIS — B349 Viral infection, unspecified: Secondary | ICD-10-CM | POA: Diagnosis not present

## 2021-12-18 DIAGNOSIS — R112 Nausea with vomiting, unspecified: Secondary | ICD-10-CM | POA: Diagnosis not present

## 2021-12-18 DIAGNOSIS — Z20822 Contact with and (suspected) exposure to covid-19: Secondary | ICD-10-CM | POA: Diagnosis not present

## 2021-12-29 ENCOUNTER — Other Ambulatory Visit: Payer: Self-pay | Admitting: Obstetrics and Gynecology

## 2022-01-11 ENCOUNTER — Other Ambulatory Visit: Payer: Self-pay | Admitting: Obstetrics and Gynecology

## 2022-01-14 NOTE — Progress Notes (Unsigned)
    Virtual Visit via Telephone Note  I connected with Rose [name] on 01/15/22 at  3:55 PM EST by telephone and verified that I am speaking with the correct person using two identifiers.  Location: Patient: Work Provider: Office   I discussed the limitations, risks, security and privacy concerns of performing an evaluation and management service by telephone and the availability of in person appointments. I also discussed with the patient that there may be a patient responsible charge related to this service. The patient expressed understanding and agreed to proceed.  Subjective:    History of Present Illness:  Patient is a 32 y.o. G76P2002 female who presents for 5 month weight management follow up. She has a past history of obesity. She initiated use of Phentermine and Topamax 5 months ago.  Denies any undesirable side effects and reports compliance with medications. Notes that she has been out of her medication since last week as she ran out of refills.  Starting weight on 05/19/2021 was 242 pounds   Current interventions:  1. Diet - reports she is watching what she eats, high protein diet.  Still limiting sugary beverages.  2. Activity - walking 30 min per day.  3. Reports bowel movements are abnormal, noting mild constipation, recently started a stool softener last week.    The following portions of the patient's history were reviewed and updated as appropriate: allergies, current medications, past family history, past medical history, past social history, past surgical history, and problem list.  Review of Systems Pertinent items noted in HPI and remainder of comprehensive ROS otherwise negative.   Objective:       01/15/2022    3:45 PM 12/11/2021    4:12 PM 11/21/2021    3:57 PM  Vitals with BMI  Height 5\' 8"  5\' 8"  5\' 7"   Weight 213 lbs 221 lbs 11 oz 220 lbs  BMI 32.39 31.54 00.86  Systolic  761   Diastolic  75   Pulse  69     General appearance: alert, no  distress Abdomen: soft, non-tender.  Waist circumference 37 3/4 in.    Labs:  No new labs  Lab Results  Component Value Date   CHOL 170 03/20/2021   HDL 47 03/20/2021   LDLCALC 82 03/20/2021   TRIG 246 (H) 03/20/2021   CHOLHDL 3.6 03/15/2020   Assessment:   Weight management Obesity, Body mass index is 32.39 kg/m. Elevated triglycerides  Plan:   Weight management  - doing better with weight loss, can continue current management.  Patient reports that she had gotten down as low as 206 pounds earlier last month due to being sick with upper respiratory infection which decreased her appetite even more.   Constipation -patient currently managing with OTC stool softeners. Patient will be    I discussed the assessment and treatment plan with the patient. The patient was provided an opportunity to ask questions and all were answered. The patient agreed with the plan and demonstrated an understanding of the instructions.   The patient was advised to call back or seek an in-person evaluation if the symptoms worsen or if the condition fails to improve as anticipated.  I provided 6 minutes of non-face-to-face time during this encounter.   [name], MD [facility]

## 2022-01-15 ENCOUNTER — Ambulatory Visit (INDEPENDENT_AMBULATORY_CARE_PROVIDER_SITE_OTHER): Payer: BC Managed Care – PPO | Admitting: Obstetrics and Gynecology

## 2022-01-15 VITALS — Ht 68.0 in | Wt 213.0 lb

## 2022-01-15 DIAGNOSIS — E7849 Other hyperlipidemia: Secondary | ICD-10-CM

## 2022-01-15 DIAGNOSIS — Z7689 Persons encountering health services in other specified circumstances: Secondary | ICD-10-CM

## 2022-01-15 DIAGNOSIS — Z713 Dietary counseling and surveillance: Secondary | ICD-10-CM | POA: Diagnosis not present

## 2022-01-15 DIAGNOSIS — Z6832 Body mass index (BMI) 32.0-32.9, adult: Secondary | ICD-10-CM | POA: Diagnosis not present

## 2022-01-15 DIAGNOSIS — E669 Obesity, unspecified: Secondary | ICD-10-CM | POA: Diagnosis not present

## 2022-01-16 ENCOUNTER — Encounter: Payer: Self-pay | Admitting: Obstetrics and Gynecology

## 2022-02-07 ENCOUNTER — Other Ambulatory Visit: Payer: Self-pay | Admitting: Obstetrics and Gynecology

## 2022-02-11 NOTE — Progress Notes (Unsigned)
    GYNECOLOGY PROGRESS NOTE  Subjective:    Patient ID: Rose [name], female    DOB: Oct 07, 1990, 32 y.o.   MRN: [id number]  HPI  Patient is a 32 y.o. female who presents for 6 month weight management follow up. She has a past history of obesity. She initiated use of Phentermine and Topamax  6 months ago.  Denies any undesirable side effects and reports compliance with medications. Notes disappointment as she has been working harder to lose weight but has gained weight this visit.    Current interventions:  1. Diet - reports she is watching what she eats, high protein diet.  Still limiting sugary beverages.   2. Activity - She is weight lifting 3 x week and walking 30 minutes daily. Is looking to hire a Physiological scientist.  3. Reports bowel movements are normal.    The following portions of the patient's history were reviewed and updated as appropriate: allergies, current medications, past family history, past medical history, past social history, past surgical history, and problem list.  Review of Systems Pertinent items noted in HPI and remainder of comprehensive ROS otherwise negative.   Objective:       02/12/2022    3:39 PM 01/15/2022    3:45 PM 12/11/2021    4:12 PM  Vitals with BMI  Height 5\' 8"  5\' 8"  5\' 8"   Weight 216 lbs 3 oz 213 lbs 221 lbs 11 oz  BMI 32.88 35.59 74.16  Systolic 384  536  Diastolic 77  75  Pulse 70  69    General appearance: alert, cooperative, and no distress Abdomen: soft, non-tender.  Waist circumference 36 in.    Labs:  No new labs Assessment:   Weight management Obesity, Body mass index is 32.87 kg/m.  Plan:   Weight management  - appears to be reaching a plateau of weight loss.  Discussed taking hiatus from Phentermine for at least 2-3 months.  Can still continue Topamax for cravings. Also encouraged to contact insurance company to see if other weight loss alternatives may be covered. Discussed potential alternatives.  Continue to  encourage dietary modifications, patient also looking to get a Physiological scientist. Patient will notify if other medications are covered.    A total of 15 minutes were spent face-to-face with the patient during this encounter and over half of that time dealt with counseling and coordination of care.  [name], MD [place]

## 2022-02-12 ENCOUNTER — Ambulatory Visit (INDEPENDENT_AMBULATORY_CARE_PROVIDER_SITE_OTHER): Payer: BC Managed Care – PPO | Admitting: Obstetrics and Gynecology

## 2022-02-12 ENCOUNTER — Encounter: Payer: Self-pay | Admitting: Obstetrics and Gynecology

## 2022-02-12 VITALS — BP 112/77 | HR 70 | Resp 16 | Ht 68.0 in | Wt 216.2 lb

## 2022-02-12 DIAGNOSIS — Z7689 Persons encountering health services in other specified circumstances: Secondary | ICD-10-CM

## 2022-02-12 DIAGNOSIS — E669 Obesity, unspecified: Secondary | ICD-10-CM

## 2022-02-12 DIAGNOSIS — Z713 Dietary counseling and surveillance: Secondary | ICD-10-CM

## 2022-02-12 DIAGNOSIS — Z6832 Body mass index (BMI) 32.0-32.9, adult: Secondary | ICD-10-CM | POA: Diagnosis not present

## 2022-02-12 DIAGNOSIS — E7849 Other hyperlipidemia: Secondary | ICD-10-CM

## 2022-02-13 ENCOUNTER — Encounter: Payer: Self-pay | Admitting: Obstetrics and Gynecology

## 2022-05-31 NOTE — Patient Instructions (Signed)
Preventive Care 21-31 Years Old, Female Preventive care refers to lifestyle choices and visits with your health care provider that can promote health and wellness. Preventive care visits are also called wellness exams. What can I expect for my preventive care visit? Counseling During your preventive care visit, your health care provider may ask about your: Medical history, including: Past medical problems. Family medical history. Pregnancy history. Current health, including: Menstrual cycle. Method of birth control. Emotional well-being. Home life and relationship well-being. Sexual activity and sexual health. Lifestyle, including: Alcohol, nicotine or tobacco, and drug use. Access to firearms. Diet, exercise, and sleep habits. Work and work environment. Sunscreen use. Safety issues such as seatbelt and bike helmet use. Physical exam Your health care provider may check your: Height and weight. These may be used to calculate your BMI (body mass index). BMI is a measurement that tells if you are at a healthy weight. Waist circumference. This measures the distance around your waistline. This measurement also tells if you are at a healthy weight and may help predict your risk of certain diseases, such as type 2 diabetes and high blood pressure. Heart rate and blood pressure. Body temperature. Skin for abnormal spots. What immunizations do I need?  Vaccines are usually given at various ages, according to a schedule. Your health care provider will recommend vaccines for you based on your age, medical history, and lifestyle or other factors, such as travel or where you work. What tests do I need? Screening Your health care provider may recommend screening tests for certain conditions. This may include: Pelvic exam and Pap test. Lipid and cholesterol levels. Diabetes screening. This is done by checking your blood sugar (glucose) after you have not eaten for a while (fasting). Hepatitis  B test. Hepatitis C test. HIV (human immunodeficiency virus) test. STI (sexually transmitted infection) testing, if you are at risk. BRCA-related cancer screening. This may be done if you have a family history of breast, ovarian, tubal, or peritoneal cancers. Talk with your health care provider about your test results, treatment options, and if necessary, the need for more tests. Follow these instructions at home: Eating and drinking  Eat a healthy diet that includes fresh fruits and vegetables, whole grains, lean protein, and low-fat dairy products. Take vitamin and mineral supplements as recommended by your health care provider. Do not drink alcohol if: Your health care provider tells you not to drink. You are pregnant, may be pregnant, or are planning to become pregnant. If you drink alcohol: Limit how much you have to 0-1 drink a day. Know how much alcohol is in your drink. In the [place] one drink equals one 12 oz bottle of beer (355 mL), one 5 oz glass of wine (148 mL), or one 1 oz glass of hard liquor (44 mL). Lifestyle Brush your teeth every morning and night with fluoride toothpaste. Floss one time each day. Exercise for at least 30 minutes 5 or more days each week. Do not use any products that contain nicotine or tobacco. These products include cigarettes, chewing tobacco, and vaping devices, such as e-cigarettes. If you need help quitting, ask your health care provider. Do not use drugs. If you are sexually active, practice safe sex. Use a condom or other form of protection to prevent STIs. If you do not wish to become pregnant, use a form of birth control. If you plan to become pregnant, see your health care provider for a prepregnancy visit. Find healthy ways to manage stress, such as: Meditation,   yoga, or listening to music. Journaling. Talking to a trusted person. Spending time with friends and family. Minimize exposure to UV radiation to reduce your risk of skin  cancer. Safety Always wear your seat belt while driving or riding in a vehicle. Do not drive: If you have been drinking alcohol. Do not ride with someone who has been drinking. If you have been using any mind-altering substances or drugs. While texting. When you are tired or distracted. Wear a helmet and other protective equipment during sports activities. If you have firearms in your house, make sure you follow all gun safety procedures. Seek help if you have been physically or sexually abused. What's next? Go to your health care provider once a year for an annual wellness visit. Ask your health care provider how often you should have your eyes and teeth checked. Stay up to date on all vaccines. This information is not intended to replace advice given to you by your health care provider. Make sure you discuss any questions you have with your health care provider. Document Revised: 06/21/2020 Document Reviewed: 06/21/2020 Elsevier Patient Education  2024 Elsevier Inc. Breast Self-Awareness Breast self-awareness is knowing how your breasts look and feel. You need to: Check your breasts on a regular basis. Tell your doctor about any changes. Become familiar with the look and feel of your breasts. This can help you catch a breast problem while it is still small and can be treated. You should do breast self-exams even if you have breast implants. What you need: A mirror. A well-lit room. A pillow or other soft object. How to do a breast self-exam Follow these steps to do a breast self-exam: Look for changes  Take off all the clothes above your waist. Stand in front of a mirror in a room with good lighting. Put your hands down at your sides. Compare your breasts in the mirror. Look for any difference between them, such as: A difference in shape. A difference in size. Wrinkles, dips, and bumps in one breast and not the other. Look at each breast for changes in the skin, such  as: Redness. Scaly areas. Skin that has gotten thicker. Dimpling. Open sores (ulcers). Look for changes in your nipples, such as: Fluid coming out of a nipple. Fluid around a nipple. Bleeding. Dimpling. Redness. A nipple that looks pushed in (retracted), or that has changed position. Feel for changes Lie on your back. Feel each breast. To do this: Pick a breast to feel. Place a pillow under the shoulder closest to that breast. Put the arm closest to that breast behind your head. Feel the nipple area of that breast using the hand of your other arm. Feel the area with the pads of your three middle fingers by making small circles with your fingers. Use light, medium, and firm pressure. Continue the overlapping circles, moving downward over the breast. Keep making circles with your fingers. Stop when you feel your ribs. Start making circles with your fingers again, this time going upward until you reach your collarbone. Then, make circles outward across your breast and into your armpit area. Squeeze your nipple. Check for discharge and lumps. Repeat these steps to check your other breast. Sit or stand in the tub or shower. With soapy water on your skin, feel each breast the same way you did when you were lying down. Write down what you find Writing down what you find can help you remember what to tell your doctor. Write down: What is   normal for each breast. Any changes you find in each breast. These include: The kind of changes you find. A tender or painful breast. Any lump you find. Write down its size and where it is. When you last had your monthly period (menstrual cycle). General tips If you are breastfeeding, the best time to check your breasts is after you feed your baby or after you use a breast pump. If you get monthly bleeding, the best time to check your breasts is 5-7 days after your monthly cycle ends. With time, you will become comfortable with the self-exam. You will  also start to know if there are changes in your breasts. Contact a doctor if: You see a change in the shape or size of your breasts or nipples. You see a change in the skin of your breast or nipples, such as red or scaly skin. You have fluid coming from your nipples that is not normal. You find a new lump or thick area. You have breast pain. You have any concerns about your breast health. Summary Breast self-awareness includes looking for changes in your breasts and feeling for changes within your breasts. You should do breast self-awareness in front of a mirror in a well-lit room. If you get monthly periods (menstrual cycles), the best time to check your breasts is 5-7 days after your period ends. Tell your doctor about any changes you see in your breasts. Changes include changes in size, changes on the skin, painful or tender breasts, or fluid from your nipples that is not normal. This information is not intended to replace advice given to you by your health care provider. Make sure you discuss any questions you have with your health care provider. Document Revised: 05/31/2021 Document Reviewed: 10/26/2020 Elsevier Patient Education  2024 Elsevier Inc.  

## 2022-05-31 NOTE — Progress Notes (Unsigned)
GYNECOLOGY ANNUAL PHYSICAL EXAM PROGRESS NOTE  Subjective:    Rose [name] is a 32 y.o. G13P2002 female who presents for an annual exam. The patient has no complaints today. The patient {is/is not/has never been:13135} sexually active. The patient participates in regular exercise: {yes/no/not asked:9010}. Has the patient ever been transfused or tattooed?: {yes/no/not asked:9010}. The patient reports that there {is/is not:9024} domestic violence in her life.    Menstrual History: Menarche age: 62 No LMP recorded. (Menstrual status: Other).     Gynecologic History:  Contraception: NuvaRing vaginal inserts History of STI's:  History of chlamydia (2013) Last Pap: 11/18/2018. Results were: normal.  Denies  h/o abnormal pap smears.  OB History  Gravida Para Term Preterm AB Living  2 2 2  0 0 2  SAB IAB Ectopic Multiple Live Births  0 0 0 0 2    # Outcome Date GA Lbr Len/2nd Weight Sex Delivery Anes PTL Lv  2 Term 11/14/15 [redacted]w[redacted]d / 00:12 8 lb 10.3 oz (3.92 kg) M Vag-Spont EPI  LIV     Name: [name],[name]     Apgar1: 9  Apgar5: 9  1 Term 2011 [redacted]w[redacted]d   M Vag-Spont None N LIV    Obstetric Comments  Postpartum hemorrhage after 1st delivery, did not require blood transfusion.     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    Personal history UTI     Past Surgical History:  Procedure Laterality Date   APPENDECTOMY  2013   acute appendicitis   EYE SURGERY     LAPAROSCOPIC APPENDECTOMY  02/03/2012   Procedure: APPENDECTOMY LAPAROSCOPIC;  Surgeon: Wilmon Arms. Corliss Skains, MD;  Location: [facility] OR;  Service: General;  Laterality: N/A;   lymph node biopsy     Left neck after cat scratch fever   TYMPANOSTOMY TUBE PLACEMENT      Family  History  Problem Relation Age of Onset   COPD Mother    Asthma Mother    Lung cancer Maternal Grandmother     Social History   Socioeconomic History   Marital status: Single    Spouse name: Not on file   Number of children: 1   Years of education: 14   Highest education level: Not on file  Occupational History   Occupation: Conservation officer, nature at [employer]   Occupation: dispatcher  Tobacco Use   Smoking status: Former    Types: Cigarettes    Quit date: 2022    Years since quitting: 2.3   Smokeless tobacco: Never   Tobacco comments:    One pack per week.  Vaping Use   Vaping Use: Some days  Substance and Sexual Activity   Alcohol use: Yes    Alcohol/week: 0.0 standard drinks of alcohol    Comment: Occasion use - socially   Drug use: Not Currently    Types: Marijuana   Sexual activity: Yes    Birth control/protection: Implant  Other Topics Concern   Not on file  Social History Narrative   Lives at home with her son.   Right-handed.   6-8 cups caffeine per day.   Social Determinants of Health   Financial Resource Strain: Not on file  Food Insecurity:  Not on file  Transportation Needs: Not on file  Physical Activity: Not on file  Stress: Not on file  Social Connections: Not on file  Intimate Partner Violence: Not on file    Current Outpatient Medications on File Prior to Visit  Medication Sig Dispense Refill   acetaminophen (TYLENOL) 500 MG tablet Take 500 mg by mouth every 6 (six) hours as needed.     Aspirin-Acetaminophen-Caffeine (EXCEDRIN MIGRAINE PO) Take by mouth.     ibuprofen (ADVIL,MOTRIN) 200 MG tablet Take 200 mg by mouth every 6 (six) hours as needed.     Multiple Vitamins-Minerals (WOMENS MULTIVITAMIN PO) Take by mouth.     NUVARING 0.12-0.015 MG/24HR vaginal ring INSERT ONE VAGINALLY AND LEAVE IN PLACE FOR THREE CONSECUTIVE WEEKS. THEN REMOVE FOR ONE WEEK 3 each 3   phentermine 37.5 MG capsule TAKE 1 CAPSULE(37.5 MG) BY MOUTH EVERY MORNING 30 capsule 1    Prenatal Vit-Fe Fumarate-FA (PRENATAL VITAMINS PO) Take by mouth.     SUMAtriptan (IMITREX) 25 MG tablet Take by mouth.     topiramate (TOPAMAX) 25 MG tablet TAKE 1 TABLET(25 MG) BY MOUTH TWICE DAILY 60 tablet 3   Vitamin D, Ergocalciferol, (DRISDOL) 1.25 MG (50000 UNIT) CAPS capsule Take 1 capsule (50,000 Units total) by mouth every 7 (seven) days. 4 capsule 0   No current facility-administered medications on file prior to visit.    Allergies  Allergen Reactions   Penicillins Hives    Has patient had a PCN reaction causing immediate rash, facial/tongue/throat swelling, SOB or lightheadedness with hypotension: Yes Has patient had a PCN reaction causing severe rash involving mucus membranes or skin necrosis: No Has patient had a PCN reaction that required hospitalization Yes Has patient had a PCN reaction occurring within the last 10 years: No If all of the above answers are "NO", then may proceed with Cephalosporin use.      Review of Systems Constitutional: negative for chills, fatigue, fevers and sweats Eyes: negative for irritation, redness and visual disturbance Ears, nose, mouth, throat, and face: negative for hearing loss, nasal congestion, snoring and tinnitus Respiratory: negative for asthma, cough, sputum Cardiovascular: negative for chest pain, dyspnea, exertional chest pressure/discomfort, irregular heart beat, palpitations and syncope Gastrointestinal: negative for abdominal pain, change in bowel habits, nausea and vomiting Genitourinary: negative for abnormal menstrual periods, genital lesions, sexual problems and vaginal discharge, dysuria and urinary incontinence Integument/breast: negative for breast lump, breast tenderness and nipple discharge Hematologic/lymphatic: negative for bleeding and easy bruising Musculoskeletal:negative for back pain and muscle weakness Neurological: negative for dizziness, headaches, vertigo and weakness Endocrine: negative for diabetic  symptoms including polydipsia, polyuria and skin dryness Allergic/Immunologic: negative for hay fever and urticaria      Objective:  There were no vitals taken for this visit. There is no height or weight on file to calculate BMI.    General Appearance:    Alert, cooperative, no distress, appears stated age  Head:    Normocephalic, without obvious abnormality, atraumatic  Eyes:    PERRL, conjunctiva/corneas clear, EOM's intact, both eyes  Ears:    Normal external ear canals, both ears  Nose:   Nares normal, septum midline, mucosa normal, no drainage or sinus tenderness  Throat:   Lips, mucosa, and tongue normal; teeth and gums normal  Neck:   Supple, symmetrical, trachea midline, no adenopathy; thyroid: no enlargement/tenderness/nodules; no carotid bruit or JVD  Back:     Symmetric, no curvature, ROM normal, no CVA tenderness  Lungs:  Clear to auscultation bilaterally, respirations unlabored  Chest Wall:    No tenderness or deformity   Heart:    Regular rate and rhythm, S1 and S2 normal, no murmur, rub or gallop  Breast Exam:    No tenderness, masses, or nipple abnormality  Abdomen:     Soft, non-tender, bowel sounds active all four quadrants, no masses, no organomegaly.    Genitalia:    Pelvic:external genitalia normal, vagina without lesions, discharge, or tenderness, rectovaginal septum  normal. Cervix normal in appearance, no cervical motion tenderness, no adnexal masses or tenderness.  Uterus normal size, shape, mobile, regular contours, nontender.  Rectal:    Normal external sphincter.  No hemorrhoids appreciated. Internal exam not done.   Extremities:   Extremities normal, atraumatic, no cyanosis or edema  Pulses:   2+ and symmetric all extremities  Skin:   Skin color, texture, turgor normal, no rashes or lesions  Lymph nodes:   Cervical, supraclavicular, and axillary nodes normal  Neurologic:   CNII-XII intact, normal strength, sensation and reflexes throughout   .  Labs:   Lab Results  Component Value Date   WBC 8.1 03/20/2021   HGB 15.0 03/20/2021   HCT 42.3 03/20/2021   MCV 89 03/20/2021   PLT 252 03/20/2021    Lab Results  Component Value Date   CREATININE 0.79 03/20/2021   BUN 10 03/20/2021   NA 141 03/20/2021   K 4.3 03/20/2021   CL 103 03/20/2021   CO2 20 03/20/2021    Lab Results  Component Value Date   ALT 30 03/20/2021   AST 37 03/20/2021   ALKPHOS 61 03/20/2021   BILITOT 0.4 03/20/2021    Lab Results  Component Value Date   TSH 1.460 03/20/2021     Assessment:   No diagnosis found.   Plan:  Blood tests: Labs ordered today. Breast self exam technique reviewed and patient encouraged to perform self-exam monthly. Contraception: NuvaRing vaginal inserts. Discussed healthy lifestyle modifications. Mammogram  :Not age appropriate Pap smear ordered. COVID vaccination status: Follow up in 1 year for annual exam   Hildred Laser, MD [facility]

## 2022-06-04 ENCOUNTER — Ambulatory Visit (INDEPENDENT_AMBULATORY_CARE_PROVIDER_SITE_OTHER): Payer: BC Managed Care – PPO | Admitting: Obstetrics and Gynecology

## 2022-06-04 ENCOUNTER — Other Ambulatory Visit (HOSPITAL_COMMUNITY)
Admission: RE | Admit: 2022-06-04 | Discharge: 2022-06-04 | Disposition: A | Discharge status: Home / Self Care | Payer: BC Managed Care – PPO | Source: Ambulatory Visit | Attending: Obstetrics and Gynecology | Admitting: Obstetrics and Gynecology

## 2022-06-04 ENCOUNTER — Encounter: Payer: Self-pay | Admitting: Obstetrics and Gynecology

## 2022-06-04 VITALS — BP 104/62 | HR 63 | Resp 16 | Ht 68.0 in | Wt 213.9 lb

## 2022-06-04 DIAGNOSIS — Z124 Encounter for screening for malignant neoplasm of cervix: Secondary | ICD-10-CM

## 2022-06-04 DIAGNOSIS — E669 Obesity, unspecified: Secondary | ICD-10-CM

## 2022-06-04 DIAGNOSIS — E781 Pure hyperglyceridemia: Secondary | ICD-10-CM

## 2022-06-04 DIAGNOSIS — Z01419 Encounter for gynecological examination (general) (routine) without abnormal findings: Secondary | ICD-10-CM | POA: Diagnosis not present

## 2022-06-04 DIAGNOSIS — Z131 Encounter for screening for diabetes mellitus: Secondary | ICD-10-CM

## 2022-06-04 DIAGNOSIS — E538 Deficiency of other specified B group vitamins: Secondary | ICD-10-CM

## 2022-06-04 DIAGNOSIS — E7849 Other hyperlipidemia: Secondary | ICD-10-CM

## 2022-06-04 DIAGNOSIS — E559 Vitamin D deficiency, unspecified: Secondary | ICD-10-CM

## 2022-06-04 MED ORDER — NUVARING 0.12-0.015 MG/24HR VA RING: VAGINAL_RING | VAGINAL | 3 refills | Status: DC

## 2022-06-04 MED ORDER — PHENTERMINE HCL 37.5 MG PO CAPS: 37.5000 mg | ORAL_CAPSULE | ORAL | 3 refills | Status: DC

## 2022-06-05 LAB — COMPREHENSIVE METABOLIC PANEL
ALT: 19 IU/L (ref 0–32)
AST: 19 IU/L (ref 0–40)
Albumin/Globulin Ratio: 1.7 (ref 1.2–2.2)
Albumin: 4.1 g/dL (ref 4.0–5.0)
Alkaline Phosphatase: 55 IU/L (ref 44–121)
BUN/Creatinine Ratio: 9 (ref 9–23)
BUN: 7 mg/dL (ref 6–20)
Bilirubin Total: <0.2 mg/dL (ref 0.0–1.2)
CO2: 20 mmol/L (ref 20–29)
Calcium: 8.7 mg/dL (ref 8.7–10.2)
Chloride: 106 mmol/L (ref 96–106)
Creatinine, Ser: 0.78 mg/dL (ref 0.57–1.00)
Globulin, Total: 2.4 g/dL (ref 1.5–4.5)
Glucose: 87 mg/dL (ref 70–99)
Potassium: 4.3 mmol/L (ref 3.5–5.2)
Sodium: 139 mmol/L (ref 134–144)
Total Protein: 6.5 g/dL (ref 6.0–8.5)
eGFR: 105 mL/min/{1.73_m2} (ref >59)

## 2022-06-05 LAB — LIPID PANEL
Chol/HDL Ratio: 3 ratio (ref 0.0–4.4)
Cholesterol, Total: 128 mg/dL (ref 100–199)
HDL: 42 mg/dL (ref >39)
LDL Chol Calc (NIH): 48 mg/dL (ref 0–99)
Triglycerides: 243 mg/dL — ABNORMAL HIGH (ref 0–149)
VLDL Cholesterol Cal: 38 mg/dL (ref 5–40)

## 2022-06-05 LAB — HEMOGLOBIN A1C
Est. average glucose Bld gHb Est-mCnc: 100 mg/dL
Hgb A1c MFr Bld: 5.1 % (ref 4.8–5.6)

## 2022-06-05 LAB — CBC
Hematocrit: 40.2 % (ref 34.0–46.6)
Hemoglobin: 13.2 g/dL (ref 11.1–15.9)
MCH: 31.4 pg (ref 26.6–33.0)
MCHC: 32.8 g/dL (ref 31.5–35.7)
MCV: 96 fL (ref 79–97)
Platelets: 196 10*3/uL (ref 150–450)
RBC: 4.2 x10E6/uL (ref 3.77–5.28)
RDW: 12.8 % (ref 11.7–15.4)
WBC: 4.3 10*3/uL (ref 3.4–10.8)

## 2022-06-10 ENCOUNTER — Other Ambulatory Visit: Payer: Self-pay | Admitting: Obstetrics and Gynecology

## 2022-06-14 LAB — CYTOLOGY - PAP
Comment: NEGATIVE
Comment: NEGATIVE
Comment: NEGATIVE
HPV 16: NEGATIVE
HPV 18 / 45: NEGATIVE
High risk HPV: POSITIVE — AB

## 2022-07-10 ENCOUNTER — Telehealth (INDEPENDENT_AMBULATORY_CARE_PROVIDER_SITE_OTHER): Payer: BC Managed Care – PPO | Admitting: Obstetrics and Gynecology

## 2022-07-10 VITALS — Ht 68.0 in | Wt 204.2 lb

## 2022-07-10 DIAGNOSIS — Z6831 Body mass index (BMI) 31.0-31.9, adult: Secondary | ICD-10-CM

## 2022-07-10 DIAGNOSIS — Z713 Dietary counseling and surveillance: Secondary | ICD-10-CM | POA: Diagnosis not present

## 2022-07-10 DIAGNOSIS — E669 Obesity, unspecified: Secondary | ICD-10-CM

## 2022-07-10 DIAGNOSIS — Z7689 Persons encountering health services in other specified circumstances: Secondary | ICD-10-CM

## 2022-07-10 NOTE — Progress Notes (Signed)
Virtual Visit via Video Note  I connected with Rose [name] on 07/10/22 at  3:55 PM EDT by a video enabled telemedicine application and verified that I am speaking with the correct person using two identifiers.  Location: Patient: Work Provider: Office   I discussed the limitations of evaluation and management by telemedicine and the availability of in person appointments. The patient expressed understanding and agreed to proceed.  History of Present Illness:  Patient is a 32 y.o. G30P2002 female who presents for 1 month weight management follow up. She has a past history of obesity. She resumed Phentermine 1 month ago after 3 month hiatus due to plateau in weight loss. Also continues use of Topamax.  Denies any undesirable side effects and reports compliance with medications. Began weight loss journey ~ 1 year ago with Phentermine, uses for 3-4 months at a time, and then takes hiatus for 3-4 months due to plateau. Starting weight last year was 242 lbs.    Current interventions:  1. Diet - low calorie/high protein diet.  Mostly chicken and veggies.  2. Activity - tries to walk, do light weights at home for at least 30 minutes 3. Reports bowel movements are normal.    The following portions of the patient's history were reviewed and updated as appropriate: allergies, current medications, past family history, past medical history, past social history, past surgical history, and problem list.  Review of Systems Pertinent items noted in HPI and remainder of comprehensive ROS otherwise negative.    Labs:  No new labs   Assessment:  Weight management Obesity, Body mass index is 31.05 kg/m   Plan:  Weight management  - doing well with weight loss, can continue current management.  Can continue use for additional 2 months. By that time, patient's BMI will likely be under 30 and can discontinue use.  Follow up in 2 months.   Follow Up Instructions:    I discussed the  assessment and treatment plan with the patient. The patient was provided an opportunity to ask questions and all were answered. The patient agreed with the plan and demonstrated an understanding of the instructions.   The patient was advised to call back or seek an in-person evaluation if the symptoms worsen or if the condition fails to improve as anticipated.  I provided 8 minutes of non-face-to-face time during this encounter.   Hildred Laser, MD [facility] at [facility]

## 2022-09-17 ENCOUNTER — Telehealth: Payer: BC Managed Care – PPO | Admitting: Obstetrics and Gynecology

## 2022-10-05 ENCOUNTER — Other Ambulatory Visit: Payer: Self-pay | Admitting: Obstetrics and Gynecology

## 2023-02-06 ENCOUNTER — Other Ambulatory Visit: Payer: Self-pay | Admitting: Obstetrics and Gynecology

## 2023-04-21 ENCOUNTER — Ambulatory Visit
Admission: EM | Admit: 2023-04-21 | Discharge: 2023-04-21 | Disposition: A | Discharge status: Home / Self Care | Attending: Family Medicine | Admitting: Family Medicine

## 2023-04-21 DIAGNOSIS — J209 Acute bronchitis, unspecified: Secondary | ICD-10-CM | POA: Diagnosis not present

## 2023-04-21 DIAGNOSIS — N760 Acute vaginitis: Secondary | ICD-10-CM | POA: Insufficient documentation

## 2023-04-21 DIAGNOSIS — J329 Chronic sinusitis, unspecified: Secondary | ICD-10-CM

## 2023-04-21 DIAGNOSIS — B9689 Other specified bacterial agents as the cause of diseases classified elsewhere: Secondary | ICD-10-CM | POA: Insufficient documentation

## 2023-04-21 DIAGNOSIS — N390 Urinary tract infection, site not specified: Secondary | ICD-10-CM | POA: Insufficient documentation

## 2023-04-21 MED ORDER — AZITHROMYCIN 250 MG PO TABS: ORAL_TABLET | ORAL | 0 refills | Status: DC

## 2023-04-21 MED ORDER — ALBUTEROL SULFATE HFA 108 (90 BASE) MCG/ACT IN AERS
2.0000 | INHALATION_SPRAY | Freq: Once | RESPIRATORY_TRACT | Status: AC
  Administered 2023-04-21: 2 via RESPIRATORY_TRACT

## 2023-04-21 MED ORDER — PREDNISONE 20 MG PO TABS: 20.0000 mg | ORAL_TABLET | Freq: Two times a day (BID) | ORAL | 0 refills | Status: AC

## 2023-04-21 MED ORDER — ALBUTEROL SULFATE HFA 108 (90 BASE) MCG/ACT IN AERS: 2.0000 | INHALATION_SPRAY | RESPIRATORY_TRACT | Status: AC | PRN

## 2023-04-21 NOTE — ED Triage Notes (Signed)
 Pt presents with stuffy nose, fever, cough, sneeze, and chest congestion. Symptoms onset Thursday. Pt denies emesis and diarrhea but does complain of nausea.

## 2023-04-21 NOTE — Discharge Instructions (Signed)
 Complete entire course of medication and should gradually.  Return for evaluation if symptoms worsen or do not improve with prescribed treatment.

## 2023-04-21 NOTE — ED Provider Notes (Signed)
 [facility]    CSN: [id number] Arrival date & time: 04/21/23  1144      History   Chief Complaint Chief Complaint  Patient presents with   Nasal Congestion   chest congestion    HPI Rose [name] is a 33 y.o. female.   HPI Patient presents today with stuffy nose, fever, cough and sneezing and chest congestion was been present for 5 days.  Patient has a history of asthma as a child however has not had any recurrent episodes as an adult.  States that she feels that her chest is tight with breathing.  She also has had congestion and postnasal drainage over the last 3 days.  Taken over-the-counter medication without any improvement of symptoms.  She last felt febrile overnight.  Denies any specifically known sick contacts.  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    Personal history UTI     Patient Active Problem List   Diagnosis Date Noted   Bacterial vaginosis 04/21/2023   Urinary tract infectious disease 04/21/2023   Obesity (BMI 35.0-39.9 without comorbidity) 06/08/2017   Tobacco abuse 05/20/2015   Migraine with aura and without status migrainosus, not intractable 05/20/2015   Left arm numbness    Chlamydial infection 01/10/2012    Past Surgical History:  Procedure Laterality Date   APPENDECTOMY  2013   acute appendicitis   EYE SURGERY     LAPAROSCOPIC APPENDECTOMY  02/03/2012   Procedure: APPENDECTOMY LAPAROSCOPIC;  Surgeon: Kari Otto. Tsuei, MD;  Location: [facility] OR;  Service: General;  Laterality: N/A;   lymph node biopsy     Left neck after cat scratch fever   TYMPANOSTOMY TUBE PLACEMENT      OB History     Gravida  2   Para  2   Term  2   Preterm      AB      Living  2      SAB      IAB       Ectopic      Multiple  0   Live Births  2        Obstetric Comments  Postpartum hemorrhage after 1st delivery, did not require blood transfusion.           Home Medications    Prior to Admission medications   Medication Sig Start Date End Date Taking? Authorizing Provider  albuterol (VENTOLIN HFA) 108 (90 Base) MCG/ACT inhaler Inhale 2 puffs into the lungs every 4 (four) hours as needed for wheezing or shortness of breath. 04/21/23  Yes Buena Carmine, NP  azithromycin (ZITHROMAX) 250 MG tablet Take 2 tabs PO x 1 dose, then 1 tab PO QD x 4 days 04/21/23  Yes Buena Carmine, NP  predniSONE (DELTASONE) 20 MG tablet Take 1 tablet (20 mg total) by mouth 2 (two) times daily with a meal for 5 days. 04/21/23 04/26/23 Yes Buena Carmine, NP  acetaminophen (TYLENOL) 500 MG tablet Take 500 mg by mouth every 6 (six) hours as needed.    [provider]  Aspirin-Acetaminophen-Caffeine (EXCEDRIN MIGRAINE PO) Take by mouth.    [provider]  ibuprofen (ADVIL,MOTRIN) 200 MG tablet  Take 200 mg by mouth every 6 (six) hours as needed.    [provider]  Multiple Vitamins-Minerals (WOMENS MULTIVITAMIN PO) Take by mouth.    [provider]  NUVARING 0.12-0.015 MG/24HR vaginal ring INSERT ONE VAGINALLY AND LEAVE IN PLACE FOR THREE CONSECUTIVE WEEKS. THEN REMOVE FOR ONE WEEK 06/04/22   Hildred Laser, MD  phentermine 37.5 MG capsule Take 1 capsule (37.5 mg total) by mouth every morning. 06/04/22   Hildred Laser, MD  Prenatal Vit-Fe Fumarate-FA (PRENATAL VITAMINS PO) Take by mouth.    [provider]  SUMAtriptan (IMITREX) 25 MG tablet Take by mouth. 04/05/21   [provider]  topiramate (TOPAMAX) 25 MG tablet TAKE 1 TABLET(25 MG) BY MOUTH TWICE DAILY 02/06/23   Hildred Laser, MD    Family History Family History  Problem Relation Age of Onset   COPD Mother    Asthma Mother    Lung cancer Maternal Grandmother     Social  History Social History   Tobacco Use   Smoking status: Former    Current packs/day: 0.00    Types: Cigarettes    Quit date: 2022    Years since quitting: 3.2   Smokeless tobacco: Never   Tobacco comments:    One pack per week.  Vaping Use   Vaping status: Some Days  Substance Use Topics   Alcohol use: Yes    Alcohol/week: 0.0 standard drinks of alcohol    Comment: Occasion use - socially   Drug use: Not Currently    Types: Marijuana     Allergies   Penicillins   Review of Systems Review of Systems Pertinent negatives listed in HPI   Physical Exam Triage Vital Signs ED Triage Vitals  Encounter Vitals Group     BP 04/21/23 1230 126/84     Systolic BP Percentile --      Diastolic BP Percentile --      Pulse Rate 04/21/23 1230 65     Resp 04/21/23 1230 16     Temp 04/21/23 1230 98.4 F (36.9 C)     Temp Source 04/21/23 1230 Oral     SpO2 04/21/23 1230 96 %     Weight --      Height --      Head Circumference --      Peak Flow --      Pain Score 04/21/23 1226 3     Pain Loc --      Pain Education --      Exclude from Growth Chart --    No data found.  Updated Vital Signs BP 126/84 (BP Location: Left Arm)   Pulse 65   Temp 98.4 F (36.9 C) (Oral)   Resp 16   LMP 04/01/2023 (Exact Date)   SpO2 96%   Visual Acuity Right Eye Distance:   Left Eye Distance:   Bilateral Distance:    Right Eye Near:   Left Eye Near:    Bilateral Near:     Physical Exam Vitals reviewed.  Constitutional:      Appearance: Normal appearance. She is ill-appearing.  HENT:     Head: Normocephalic and atraumatic.     Right Ear: Tympanic membrane, ear canal and external ear normal.     Left Ear: Tympanic membrane, ear canal and external ear normal.     Nose: Congestion and rhinorrhea present.     Mouth/Throat:     Pharynx: Postnasal drip present. No oropharyngeal exudate or posterior oropharyngeal erythema.  Eyes:  Extraocular Movements: Extraocular movements intact.      Pupils: Pupils are equal, round, and reactive to light.  Cardiovascular:     Rate and Rhythm: Normal rate and regular rhythm.  Pulmonary:     Breath sounds: Rhonchi present.  Musculoskeletal:     Cervical back: Normal range of motion and neck supple.  Skin:    General: Skin is warm and dry.  Neurological:     General: No focal deficit present.     Mental Status: She is alert.    UC Treatments / Results  Labs (all labs ordered are listed, but only abnormal results are displayed) Labs Reviewed - No data to display  EKG   Radiology No results found.  Procedures Procedures (including critical care time)  Medications Ordered in UC Medications  albuterol (VENTOLIN HFA) 108 (90 Base) MCG/ACT inhaler 2 puff (2 puffs Inhalation Given 04/21/23 1309)    Initial Impression / Assessment and Plan / UC Course  I have reviewed the triage vital signs and the nursing notes.  Pertinent labs & imaging results that were available during my care of the patient were reviewed by me and considered in my medical decision making (see chart for details).   1. Acute bronchitis, unspecified organism (Primary) - albuterol (VENTOLIN HFA) 108 (90 Base) MCG/ACT inhaler 2 puff - predniSONE (DELTASONE) 20 MG tablet; Take 1 tablet (20 mg total) by mouth 2 (two) times daily with a meal for 5 days.  Dispense: 10 tablet; Refill: 0  2. Rhinosinusitis - azithromycin (ZITHROMAX) 250 MG tablet; Take 2 tabs PO x 1 dose, then 1 tab PO QD x 4 days  Dispense: 6 tablet; Refill: 0 Return precautions given if symptoms worsen or do not improve after completion of treatment.  Final Clinical Impressions(s) / UC Diagnoses   Final diagnoses:  Acute bronchitis, unspecified organism  Rhinosinusitis     Discharge Instructions      Complete entire course of medication and should gradually.  Return for evaluation if symptoms worsen or do not improve with prescribed treatment.     ED Prescriptions      Medication Sig Dispense Auth. Provider   albuterol (VENTOLIN HFA) 108 (90 Base) MCG/ACT inhaler Inhale 2 puffs into the lungs every 4 (four) hours as needed for wheezing or shortness of breath. -- Buena Carmine, NP   predniSONE (DELTASONE) 20 MG tablet Take 1 tablet (20 mg total) by mouth 2 (two) times daily with a meal for 5 days. 10 tablet Buena Carmine, NP   azithromycin (ZITHROMAX) 250 MG tablet Take 2 tabs PO x 1 dose, then 1 tab PO QD x 4 days 6 tablet Buena Carmine, NP      PDMP not reviewed this encounter.   Buena Carmine, NP 04/21/23 [phone]

## 2023-05-09 ENCOUNTER — Telehealth: Payer: Self-pay

## 2023-05-09 MED ORDER — NUVARING 0.12-0.015 MG/24HR VA RING: VAGINAL_RING | VAGINAL | 0 refills | Status: DC

## 2023-05-09 NOTE — Telephone Encounter (Signed)
 Contacted the patient via phone, Rose [name] is scheduled for Monday June 16, 2023 at 1:35 pm with Fred Jacobsen. Please advise refill?

## 2023-05-09 NOTE — Telephone Encounter (Signed)
 NuvaRing  has been sent to pharmacy on file.

## 2023-06-16 ENCOUNTER — Other Ambulatory Visit (HOSPITAL_COMMUNITY)
Admission: RE | Admit: 2023-06-16 | Discharge: 2023-06-16 | Disposition: A | Discharge status: Home / Self Care | Source: Ambulatory Visit

## 2023-06-16 ENCOUNTER — Ambulatory Visit (INDEPENDENT_AMBULATORY_CARE_PROVIDER_SITE_OTHER)

## 2023-06-16 VITALS — BP 118/78 | HR 101 | Ht 68.0 in | Wt 218.0 lb

## 2023-06-16 DIAGNOSIS — Z3044 Encounter for surveillance of vaginal ring hormonal contraceptive device: Secondary | ICD-10-CM

## 2023-06-16 DIAGNOSIS — Z01419 Encounter for gynecological examination (general) (routine) without abnormal findings: Secondary | ICD-10-CM

## 2023-06-16 DIAGNOSIS — Z113 Encounter for screening for infections with a predominantly sexual mode of transmission: Secondary | ICD-10-CM | POA: Diagnosis not present

## 2023-06-16 DIAGNOSIS — Z124 Encounter for screening for malignant neoplasm of cervix: Secondary | ICD-10-CM

## 2023-06-16 DIAGNOSIS — Z0000 Encounter for general adult medical examination without abnormal findings: Secondary | ICD-10-CM | POA: Insufficient documentation

## 2023-06-16 MED ORDER — NUVARING 0.12-0.015 MG/24HR VA RING: VAGINAL_RING | VAGINAL | 11 refills | Status: AC

## 2023-06-16 NOTE — Addendum Note (Signed)
 Addended by: Fred Jacobsen on: 06/16/2023 02:20 PM   Modules accepted: Orders

## 2023-06-16 NOTE — Assessment & Plan Note (Signed)
-   Reviewed health maintenance topics as documented below. - Most recent pap smear in 2024- LSIL/HPV+, colpo recommended but patient unaware and did not schedule. pap smear collected today., will follow up with recommendations based on today's results.  - Satisfied with current contraception. Refill for NuvaRing  provided. - STI screening accepted.  - UTD on other screenings, CBC collected today.

## 2023-06-16 NOTE — Progress Notes (Signed)
 Outpatient Gynecology Note: Annual Visit  Assessment/Plan:   Rose [name] is a 33 y.o. female [phone] with normal well-woman gynecologic exam.   Well woman exam - Reviewed health maintenance topics as documented below. - Most recent pap smear in 2024- LSIL/HPV+, colpo recommended but patient unaware and did not schedule. pap smear collected today., will follow up with recommendations based on today's results.  - Satisfied with current contraception. Refill for NuvaRing  provided. - STI screening accepted.  - UTD on other screenings, CBC collected today.      Orders Placed This Encounter  Procedures   HEP, RPR, HIV Panel   Hepatitis C antibody   CBC   Current Outpatient Medications  Medication Instructions   acetaminophen  (TYLENOL ) 500 mg, Every 6 hours PRN   albuterol  (VENTOLIN  HFA) 108 (90 Base) MCG/ACT inhaler 2 puffs, Inhalation, Every 4 hours PRN   Aspirin-Acetaminophen -Caffeine  (EXCEDRIN MIGRAINE PO) Take by mouth.   azithromycin  (ZITHROMAX ) 250 MG tablet Take 2 tabs PO x 1 dose, then 1 tab PO QD x 4 days   ibuprofen  (ADVIL ) 200 mg, Every 6 hours PRN   Multiple Vitamins-Minerals (WOMENS MULTIVITAMIN PO) Take by mouth.   NUVARING  0.12-0.015 MG/24HR vaginal ring INSERT ONE VAGINALLY AND LEAVE IN PLACE FOR THREE CONSECUTIVE WEEKS. THEN REMOVE FOR ONE WEEK   phentermine  37.5 mg, Oral, BH-each morning   Prenatal Vit-Fe Fumarate-FA (PRENATAL VITAMINS PO) Take by mouth.   SUMAtriptan  (IMITREX ) 25 MG tablet Take by mouth.   topiramate  (TOPAMAX ) 25 MG tablet TAKE 1 TABLET(25 MG) BY MOUTH TWICE DAILY    Return in about 1 year (around 06/15/2024) for Annual physical.   Subjective:   Rose [name] is a 33 y.o. female G2P2002 who presents for annual wellness visit.    CONCERNS? none  Well Woman Visit:  GYN HISTORY:  Patient's last menstrual period was 06/05/2023.     Menstrual History: OB History     Gravida  2   Para  2   Term  2   Preterm       AB      Living  2      SAB      IAB      Ectopic      Multiple  0   Live Births  2        Obstetric Comments  Postpartum hemorrhage after 1st delivery, did not require blood transfusion.          Menarche age: 24-14 Patient's last menstrual period was 06/05/2023. Period Duration (Days): 2-6 Period Pattern: (!) Irregular Menstrual Flow: Light Menstrual Control: Panty liner, Thin pad Menstrual Control Change Freq (Hours): 2-6 Dysmenorrhea: (!) Moderate Dysmenorrhea Symptoms: Cramping Intermenstrual bleeding, spotting, or discharge? no Urinary incontinence? no  Sexually active: yes Number of sexual partners: 3 Gender of sexual Partners: male Dyspareunia? no Last pap:was abnormal: see results  History of abnormal Pap: yes, in 2024, LSIL/HPV+, colpo recommended but she was not aware and never scheduled Gardasil series:  yes STI history: chlamydia STI/HIV testing or immunizations needed? Yes.    Contraceptive methods: Ortho-Evra  Health Maintenance > Reviewed breast self-awareness, no family history > History of abnormal mammogram: No > Exercise: none, > Dietary Supplements: PNV > Body mass index is 33.15 kg/m.  > Recent dental visit Yes.   > Seat Belt Use: Yes.   > Safe in current relationship? Yes.   > Concern for alcohol abuse? 1-2 drinks per week   Tobacco or other drug use:  Vapes every day Tobacco Use: Medium Risk (06/16/2023)   Patient History    Smoking Tobacco Use: Former    Smokeless Tobacco Use: Never    Passive Exposure: Not on file     PHQ-2 Score: In last two weeks, how often have you felt: Little interest or pleasure in doing things: Not at all (0) Feeling down, depressed or hopeless: Not at all (0) Score: 0  GAD-2 Over the last 2 weeks, how often have you been bothered by the following problems? Feeling nervous, anxious or on edge: Nearly every day (+3) Not being able to stop or control worrying: More than half the days  (+2)} Score:5, currently in custody battle and feels that her anxiety is mostly related to this. She endorses having resources.   _________________________________________________________  Current Outpatient Medications  Medication Sig Dispense Refill   acetaminophen  (TYLENOL ) 500 MG tablet Take 500 mg by mouth every 6 (six) hours as needed.     albuterol  (VENTOLIN  HFA) 108 (90 Base) MCG/ACT inhaler Inhale 2 puffs into the lungs every 4 (four) hours as needed for wheezing or shortness of breath.     Aspirin-Acetaminophen -Caffeine  (EXCEDRIN MIGRAINE PO) Take by mouth.     ibuprofen  (ADVIL ,MOTRIN ) 200 MG tablet Take 200 mg by mouth every 6 (six) hours as needed.     NUVARING  0.12-0.015 MG/24HR vaginal ring INSERT ONE VAGINALLY AND LEAVE IN PLACE FOR THREE CONSECUTIVE WEEKS. THEN REMOVE FOR ONE WEEK 3 each 0   Prenatal Vit-Fe Fumarate-FA (PRENATAL VITAMINS PO) Take by mouth.     topiramate  (TOPAMAX ) 25 MG tablet TAKE 1 TABLET(25 MG) BY MOUTH TWICE DAILY 60 tablet 3   azithromycin  (ZITHROMAX ) 250 MG tablet Take 2 tabs PO x 1 dose, then 1 tab PO QD x 4 days (Patient not taking: Reported on 06/16/2023) 6 tablet 0   Multiple Vitamins-Minerals (WOMENS MULTIVITAMIN PO) Take by mouth. (Patient not taking: Reported on 06/16/2023)     phentermine  37.5 MG capsule Take 1 capsule (37.5 mg total) by mouth every morning. (Patient not taking: Reported on 06/16/2023) 30 capsule 3   SUMAtriptan  (IMITREX ) 25 MG tablet Take by mouth. (Patient not taking: Reported on 06/16/2023)     No current facility-administered medications for this visit.   Allergies  Allergen Reactions   Penicillins Hives    Has patient had a PCN reaction causing immediate rash, facial/tongue/throat swelling, SOB or lightheadedness with hypotension: Yes  Has patient had a PCN reaction causing severe rash involving mucus membranes or skin necrosis: No  Has patient had a PCN reaction that required hospitalization Yes  Has patient had a PCN  reaction occurring within the last 10 years: No  If all of the above answers are "NO", then may proceed with Cephalosporin use.    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    Personal history UTI    Past Surgical History:  Procedure Laterality Date   APPENDECTOMY  2013   acute appendicitis   EYE SURGERY     LAPAROSCOPIC APPENDECTOMY  02/03/2012   Procedure: APPENDECTOMY LAPAROSCOPIC;  Surgeon: Kari Otto. Eli Grizzle, MD;  Location: [facility] OR;  Service: General;  Laterality: N/A;   lymph node biopsy  Left neck after cat scratch fever   TYMPANOSTOMY TUBE PLACEMENT     OB History     Gravida  2   Para  2   Term  2   Preterm      AB      Living  2      SAB      IAB      Ectopic      Multiple  0   Live Births  2        Obstetric Comments  Postpartum hemorrhage after 1st delivery, did not require blood transfusion.         Social History   Tobacco Use   Smoking status: Former    Current packs/day: 0.00    Types: Cigarettes    Quit date: 2022    Years since quitting: 3.4   Smokeless tobacco: Never   Tobacco comments:    One pack per week.  Substance Use Topics   Alcohol use: Yes    Alcohol/week: 0.0 standard drinks of alcohol    Comment: Occasion use - socially   Social History   Substance and Sexual Activity  Sexual Activity Yes   Birth control/protection: Implant    Immunization History  Administered Date(s) Administered   Influenza,inj,Quad PF,6+ Mos 10/03/2015   Influenza-Unspecified 10/08/2019   Tdap 09/13/2015     Review Of Systems  Constitutional: Denied constitutional symptoms, night sweats, recent illness, fatigue, fever, insomnia and weight loss.  Eyes: Denied eye symptoms, eye pain,  photophobia, vision change and visual disturbance.  Ears/Nose/Throat/Neck: Denied ear, nose, throat or neck symptoms, hearing loss, nasal discharge, sinus congestion and sore throat.  Cardiovascular: Denied cardiovascular symptoms, arrhythmia, chest pain/pressure, edema, exercise intolerance, orthopnea and palpitations.  Respiratory: Denied pulmonary symptoms, asthma, pleuritic pain, productive sputum, cough, dyspnea and wheezing.  Gastrointestinal: Denied, gastro-esophageal reflux, melena, nausea and vomiting.  Genitourinary: Denied genitourinary symptoms including symptomatic vaginal discharge, pelvic relaxation issues, and urinary complaints.  Musculoskeletal: Denied musculoskeletal symptoms, stiffness, swelling, muscle weakness and myalgia.  Dermatologic: Denied dermatology symptoms, rash and scar.  Neurologic: Denied neurology symptoms, dizziness, headache, neck pain and syncope.  Psychiatric: Denied psychiatric symptoms, anxiety and depression.  Endocrine: Denied endocrine symptoms including hot flashes and night sweats.     Objective:   BP 118/78   Pulse (!) 101   Ht 5\' 8"  (1.727 m)   Wt 218 lb (98.9 kg)   LMP 06/05/2023   BMI 33.15 kg/m   Constitutional: Well-developed, well-nourished female in no acute distress Neurological: Alert and oriented to person, place, and time Psychiatric: Mood and affect appropriate Skin: No rashes or lesions Neck: Supple without masses. Trachea is midline.Thyroid is normal size without masses Lymphatics: No cervical, axillary, supraclavicular, or inguinal adenopathy noted Respiratory: Clear to auscultation bilaterally. Good air movement with normal work of breathing. Cardiovascular: Regular rate and rhythm. Extremities grossly normal, nontender with no edema; pulses regular Gastrointestinal: Soft, nontender, nondistended. No masses or hernias appreciated. No hepatosplenomegaly. No fluid wave. No rebound or guarding. Breast Exam: normal  appearance, no masses or tenderness Genitourinary:         External Genitalia: Normal female genitalia    Vagina: Pink and moist, no lesions.    Cervix: Appeared erythematous, normal size and consistency; no cervical motion tenderness  Adnexae: Non-palpable and tender on right side. Perineum/Anus: No lesions Rectal: deferred   Fred Jacobsen, CNM  06/16/23 2:09 PM

## 2023-06-17 LAB — CBC
Hematocrit: 39.4 % (ref 34.0–46.6)
Hemoglobin: 13.2 g/dL (ref 11.1–15.9)
MCH: 31.5 pg (ref 26.6–33.0)
MCHC: 33.5 g/dL (ref 31.5–35.7)
MCV: 94 fL (ref 79–97)
Platelets: 240 10*3/uL (ref 150–450)
RBC: 4.19 x10E6/uL (ref 3.77–5.28)
RDW: 12 % (ref 11.7–15.4)
WBC: 8.7 10*3/uL (ref 3.4–10.8)

## 2023-06-17 LAB — HEP, RPR, HIV PANEL
HIV Screen 4th Generation wRfx: NONREACTIVE
Hepatitis B Surface Ag: NEGATIVE
RPR Ser Ql: NONREACTIVE

## 2023-06-17 LAB — HEPATITIS C ANTIBODY: Hep C Virus Ab: NONREACTIVE

## 2023-06-18 ENCOUNTER — Ambulatory Visit: Payer: Self-pay

## 2023-06-20 ENCOUNTER — Telehealth: Payer: Self-pay | Admitting: Obstetrics and Gynecology

## 2023-06-20 NOTE — Telephone Encounter (Signed)
 I contacted the patient via phone to scheduled Colposcopy appointment. I offered 7/23 with Dr Luster Salters. The patient requested for after labor day due to work. We currently do not have that schedule release yet. We will contact the patient at a later time. Patient is aware.

## 2023-09-17 ENCOUNTER — Ambulatory Visit: Admitting: Obstetrics and Gynecology

## 2023-10-06 ENCOUNTER — Emergency Department
Admission: EM | Admit: 2023-10-06 | Discharge: 2023-10-06 | Disposition: A | Discharge status: Home / Self Care | Attending: Emergency Medicine | Admitting: Emergency Medicine

## 2023-10-06 DIAGNOSIS — T7840XA Allergy, unspecified, initial encounter: Secondary | ICD-10-CM | POA: Diagnosis not present

## 2023-10-06 DIAGNOSIS — R220 Localized swelling, mass and lump, head: Secondary | ICD-10-CM | POA: Diagnosis not present

## 2023-10-06 NOTE — Discharge Instructions (Signed)
 You are seen in the emergency department for swelling of your face.  Concerned that you could be having allergic reaction.  Take your steroids as prescribed by urgent care.  You are given a list of primary care physicians in the area to schedule follow-up with.  Return to the emergency department for any worsening symptoms.  Stay hydrated and drink plenty of fluids.

## 2023-10-06 NOTE — ED Triage Notes (Signed)
 Pt comes with c/o allergic reaction. Pt states she did eat [place] food last night but she has had that before. Pt states it was about 2 hrs after she ate she noticed her eyes were a little swollen. Pt took some ben and went to bed. Pt woke up this morning and felt it is worse. Pt went to UC and was prescribed meds. Pt came her bc it is getting worse. Pt states her face is swollen and feels tight. Pt states painful.   Pt speaking in clear complete sentences.

## 2023-10-06 NOTE — ED Provider Notes (Signed)
 [facility] Provider Note    Event Date/Time   First MD Initiated Contact with Patient 10/06/23 1255     (approximate)   History   Allergic Reaction   HPI  Rose [name] is a 33 y.o. female no significant past medical history who presents to the emergency department with facial swelling.  Patient states that she had some facial swelling and that her eyes were swollen.  States that she went to urgent care and was given a steroid injection and a prescription for steroids.  Was told to come into the emergency department.  Denies any feeling like her throat is swelling or trouble breathing.  No nausea vomiting or diaphoresis.  No known tick bites.  No new soaps or detergents.  No daily medications.  Denies any new over-the-counter medications.     Physical Exam   Triage Vital Signs: ED Triage Vitals [10/06/23 1202]  Encounter Vitals Group     BP 131/69     Girls Systolic BP Percentile      Girls Diastolic BP Percentile      Boys Systolic BP Percentile      Boys Diastolic BP Percentile      Pulse Rate 67     Resp 18     Temp 98 F (36.7 C)     Temp src      SpO2 99 %     Weight 220 lb (99.8 kg)     Height 5' 7 (1.702 m)     Head Circumference      Peak Flow      Pain Score 6     Pain Loc      Pain Education      Exclude from Growth Chart     Most recent vital signs: Vitals:   10/06/23 1307 10/06/23 1348  BP:  120/72  Pulse:  72  Resp:  18  Temp:    SpO2: 100% 100%    Physical Exam Constitutional:      Appearance: She is well-developed.  HENT:     Head: Atraumatic.     Comments: Edema to the face.  Bilateral eyelid swelling.  Extraocular movements intact. Eyes:     Conjunctiva/sclera: Conjunctivae normal.  Cardiovascular:     Rate and Rhythm: Regular rhythm.  Pulmonary:     Effort: No respiratory distress.  Abdominal:     General: There is no distension.     Tenderness: There is no abdominal tenderness.   Musculoskeletal:        General: Normal range of motion.     Cervical back: Normal range of motion.  Skin:    General: Skin is warm.     Findings: No rash.  Neurological:     Mental Status: She is alert. Mental status is at baseline.      IMPRESSION / MDM / ASSESSMENT AND PLAN / ED COURSE  I reviewed the triage vital signs and the nursing notes.  Differential diagnosis including allergic reaction.  Clinical picture is not consistent with anaphylactic reaction.    Labs (all labs ordered are listed, but only abnormal results are displayed) Labs interpreted as -    Labs Reviewed - No data to display  No concern for angioedema.  Unclear of what the patient is having allergic reaction to.  Otherwise does not have any edema anywhere else.  Have a low suspicion for a kidney issue.  Has a prescription for prednisone .  Discussed follow-up as an outpatient with  primary care and return precautions for any ongoing or worsening symptoms.     PROCEDURES:  Critical Care performed: No  Procedures  Patient's presentation is most consistent with acute complicated illness / injury requiring diagnostic workup.    MEDICATIONS ORDERED IN ED: Medications - No data to display  FINAL CLINICAL IMPRESSION(S) / ED DIAGNOSES   Final diagnoses:  Allergic reaction, initial encounter  Facial swelling     Rx / DC Orders   ED Discharge Orders     None        Note:  This document was prepared using Dragon voice recognition software and may include unintentional dictation errors.   Suzanne Kirsch, MD 10/06/23 [phone]

## 2023-10-15 ENCOUNTER — Encounter: Admitting: Obstetrics & Gynecology

## 2023-12-03 ENCOUNTER — Other Ambulatory Visit (HOSPITAL_COMMUNITY)
Admission: RE | Admit: 2023-12-03 | Discharge: 2023-12-03 | Disposition: A | Source: Ambulatory Visit | Attending: Obstetrics & Gynecology | Admitting: Obstetrics & Gynecology

## 2023-12-03 ENCOUNTER — Encounter: Admitting: Obstetrics & Gynecology

## 2023-12-03 ENCOUNTER — Ambulatory Visit (INDEPENDENT_AMBULATORY_CARE_PROVIDER_SITE_OTHER): Admitting: Obstetrics & Gynecology

## 2023-12-03 ENCOUNTER — Encounter: Payer: Self-pay | Admitting: Obstetrics & Gynecology

## 2023-12-03 VITALS — BP 93/62 | HR 70 | Ht 69.0 in | Wt 219.0 lb

## 2023-12-03 DIAGNOSIS — R87810 Cervical high risk human papillomavirus (HPV) DNA test positive: Secondary | ICD-10-CM

## 2023-12-03 DIAGNOSIS — N870 Mild cervical dysplasia: Secondary | ICD-10-CM

## 2023-12-03 DIAGNOSIS — R300 Dysuria: Secondary | ICD-10-CM | POA: Diagnosis not present

## 2023-12-03 DIAGNOSIS — N888 Other specified noninflammatory disorders of cervix uteri: Secondary | ICD-10-CM | POA: Diagnosis not present

## 2023-12-03 LAB — POCT URINALYSIS DIPSTICK
Bilirubin, UA: NEGATIVE
Blood, UA: POSITIVE
Glucose, UA: NEGATIVE
Ketones, UA: NEGATIVE
Leukocytes, UA: NEGATIVE
Nitrite, UA: NEGATIVE
Protein, UA: NEGATIVE
Spec Grav, UA: <=1.005 — AB (ref 1.010–1.025)
Urobilinogen, UA: 0.2 U/dL
pH, UA: 5 (ref 5.0–8.0)

## 2023-12-03 NOTE — Progress Notes (Signed)
    GYNECOLOGY PROGRESS NOTE  Subjective:    Patient ID: Rose [name], female    DOB: 03/10/1990, 33 y.o.   MRN: [id number]  HPI  Patient is a 33 y.o. [id number] here for a colpo. Her pap 06/2023 showed + HR HPV. 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  The following portions of the patient's history were reviewed and updated as appropriate: allergies, current medications, past family history, past medical history, past social history, past surgical history, and problem list.  Review of Systems Pertinent items are noted in HPI.   Objective:   Height 5' 9 (1.753 m), weight 219 lb (99.3 kg). Body mass index is 32.34 kg/m. Well nourished, well hydrated Black female, no apparent distress She is ambulating and conversing normally. UPT negative, consent signed, time out done Speculum placed. Cervix prepped with acetic acid. Transformation zone seen in its entirety. Colpo adequate. Colposcopic findings normal. ECC obtained. She tolerated the procedure well.    Assessment:   1. Cervical high risk HPV (human papillomavirus) test positive      Plan:   1. Cervical high risk HPV (human papillomavirus) test positive (Primary) - await pathology We discussed the possible need for a LEEP

## 2023-12-03 NOTE — Addendum Note (Signed)
 Addended by: Kenta Laster on: 12/03/2023 03:46 PM   Modules accepted: Orders

## 2023-12-05 LAB — URINE CULTURE

## 2023-12-08 ENCOUNTER — Ambulatory Visit: Payer: Self-pay | Admitting: Obstetrics & Gynecology

## 2023-12-08 LAB — SURGICAL PATHOLOGY
